# Patient Record
Sex: Female | Born: 1975 | Race: Asian | Hispanic: No | Marital: Married | Smoking: Never smoker
Health system: Southern US, Community
[De-identification: ages and names within clinical notes are randomized; demographics above are authoritative.]

## PROBLEM LIST (undated history)

## (undated) DIAGNOSIS — N852 Hypertrophy of uterus: Secondary | ICD-10-CM

---

## 2016-02-15 ENCOUNTER — Ambulatory Visit (INDEPENDENT_AMBULATORY_CARE_PROVIDER_SITE_OTHER): Payer: Self-pay | Admitting: Internal Medicine

## 2016-02-15 ENCOUNTER — Encounter: Payer: Self-pay | Admitting: Internal Medicine

## 2016-02-15 VITALS — BP 138/82 | HR 84 | Ht <= 58 in | Wt 137.0 lb

## 2016-02-15 DIAGNOSIS — IMO0001 Reserved for inherently not codable concepts without codable children: Secondary | ICD-10-CM

## 2016-02-15 DIAGNOSIS — R030 Elevated blood-pressure reading, without diagnosis of hypertension: Secondary | ICD-10-CM

## 2016-02-15 DIAGNOSIS — L83 Acanthosis nigricans: Secondary | ICD-10-CM

## 2016-02-15 DIAGNOSIS — E669 Obesity, unspecified: Secondary | ICD-10-CM

## 2016-02-15 DIAGNOSIS — N852 Hypertrophy of uterus: Secondary | ICD-10-CM

## 2016-02-15 DIAGNOSIS — K029 Dental caries, unspecified: Secondary | ICD-10-CM

## 2016-02-15 NOTE — Patient Instructions (Signed)
Return to clinic once you have your orange card to get your referral for dentist and ultrasound set up--just drop by after you [facility] the [facility]

## 2016-02-15 NOTE — Progress Notes (Signed)
   Subjective:    Patient ID: Lindsay Howard, female    DOB: 02/26/1976, 40 y.o.   MRN: [id number]  HPI  Essential Hypertension:  Was told [place] that she had high blood pressure--about 8 years ago.  Had Had PIH with her 38 year old child as well. Has had bp checked since and was told her bp was fine. She states she is active cleaning house and cooking.  Does get out and walk around her apartment, but not very far.   Would consider the walking group.  Diet:  Rice, beans, lettuce, pork, beef, chicken, vegetables--lots of vegetables.  Does not drink soda.  Does drink Juice, but not daily.  ONly likes oranges for fruit.  Meds:  None Allergies:  NKDA    Review of Systems  Has regular periods, sometimes heavy, sometimes not.     Objective:   Physical Exam Obese in abdominal area HEENT:  PERRL, EOMI, unable to see discs well, TMs pearly gray, throat without injection.  Diffuse dental decay and discoloration from nut/tobacco chew.  Hyperpigmentation scattered on face (melasma) Neck:  Acanthosis nigricans, No adenopathy or thyromegaly Chest:  CTA CV:  RRR with normal S1 and S2, NO S3, S4, or murmur.  Radial and DP pulses normal and equal. Abd:  Obese, NT, No HSM.  Dome of uterus midway between pubic bone and umbilicus and firm.  NT LE:  NO edema       Assessment & Plan:  1.  Elevated BP:  Okay on recheck Will have her return in 2 weeks for bp check with nurse. CMP today  2.  Obesity with acanthosis nigricans;  A1C  3.  Enlarged uterus:  Pelvic ultrasound after obtaining orange card.  4.  Dental decay:  Dental referral once has orange card.

## 2016-02-16 LAB — COMPREHENSIVE METABOLIC PANEL
A/G RATIO: 1 — AB (ref 1.1–2.5)
ALK PHOS: 83 IU/L (ref 39–117)
ALT: 49 IU/L — AB (ref 0–32)
AST: 42 IU/L — ABNORMAL HIGH (ref 0–40)
Albumin: 4.1 g/dL (ref 3.5–5.5)
BILIRUBIN TOTAL: 0.5 mg/dL (ref 0.0–1.2)
BUN/Creatinine Ratio: 12 (ref 8–20)
BUN: 9 mg/dL (ref 6–20)
CHLORIDE: 102 mmol/L (ref 96–106)
CO2: 23 mmol/L (ref 18–29)
Calcium: 8.8 mg/dL (ref 8.7–10.2)
Creatinine, Ser: 0.75 mg/dL (ref 0.57–1.00)
GFR calc non Af Amer: 101 mL/min/{1.73_m2} (ref >59)
GFR, EST AFRICAN AMERICAN: 117 mL/min/{1.73_m2} (ref >59)
GLUCOSE: 84 mg/dL (ref 65–99)
Globulin, Total: 4.1 g/dL (ref 1.5–4.5)
POTASSIUM: 4.6 mmol/L (ref 3.5–5.2)
Sodium: 140 mmol/L (ref 134–144)
TOTAL PROTEIN: 8.2 g/dL (ref 6.0–8.5)

## 2016-02-16 LAB — HGB A1C W/O EAG: HEMOGLOBIN A1C: 5.7 % — AB (ref 4.8–5.6)

## 2017-02-06 ENCOUNTER — Other Ambulatory Visit: Payer: Self-pay | Admitting: Licensed Clinical Social Worker

## 2019-10-15 ENCOUNTER — Other Ambulatory Visit: Payer: Self-pay

## 2019-10-15 ENCOUNTER — Emergency Department (HOSPITAL_COMMUNITY): Payer: Medicaid Other

## 2019-10-15 ENCOUNTER — Inpatient Hospital Stay (HOSPITAL_COMMUNITY)
Admission: EM | Admit: 2019-10-15 | Discharge: 2019-10-21 | DRG: 065 | Disposition: A | Discharge status: Rehabilitation Facility | Payer: BLUE CROSS/BLUE SHIELD | Attending: Internal Medicine | Admitting: Internal Medicine

## 2019-10-15 DIAGNOSIS — I10 Essential (primary) hypertension: Secondary | ICD-10-CM | POA: Diagnosis present

## 2019-10-15 DIAGNOSIS — I4510 Unspecified right bundle-branch block: Secondary | ICD-10-CM | POA: Diagnosis present

## 2019-10-15 DIAGNOSIS — R402222 Coma scale, best verbal response, incomprehensible words, at arrival to emergency department: Secondary | ICD-10-CM | POA: Diagnosis present

## 2019-10-15 DIAGNOSIS — R1312 Dysphagia, oropharyngeal phase: Secondary | ICD-10-CM | POA: Diagnosis present

## 2019-10-15 DIAGNOSIS — K5900 Constipation, unspecified: Secondary | ICD-10-CM | POA: Diagnosis not present

## 2019-10-15 DIAGNOSIS — Z0189 Encounter for other specified special examinations: Secondary | ICD-10-CM

## 2019-10-15 DIAGNOSIS — I619 Nontraumatic intracerebral hemorrhage, unspecified: Principal | ICD-10-CM | POA: Diagnosis present

## 2019-10-15 DIAGNOSIS — Z20828 Contact with and (suspected) exposure to other viral communicable diseases: Secondary | ICD-10-CM | POA: Diagnosis present

## 2019-10-15 DIAGNOSIS — R471 Dysarthria and anarthria: Secondary | ICD-10-CM | POA: Diagnosis present

## 2019-10-15 DIAGNOSIS — R4701 Aphasia: Secondary | ICD-10-CM | POA: Diagnosis present

## 2019-10-15 DIAGNOSIS — Z4659 Encounter for fitting and adjustment of other gastrointestinal appliance and device: Secondary | ICD-10-CM

## 2019-10-15 DIAGNOSIS — R402132 Coma scale, eyes open, to sound, at arrival to emergency department: Secondary | ICD-10-CM | POA: Diagnosis present

## 2019-10-15 DIAGNOSIS — R402352 Coma scale, best motor response, localizes pain, at arrival to emergency department: Secondary | ICD-10-CM | POA: Diagnosis present

## 2019-10-15 DIAGNOSIS — I610 Nontraumatic intracerebral hemorrhage in hemisphere, subcortical: Secondary | ICD-10-CM | POA: Diagnosis not present

## 2019-10-15 DIAGNOSIS — Z23 Encounter for immunization: Secondary | ICD-10-CM

## 2019-10-15 DIAGNOSIS — F17220 Nicotine dependence, chewing tobacco, uncomplicated: Secondary | ICD-10-CM | POA: Diagnosis present

## 2019-10-15 DIAGNOSIS — Z820 Family history of epilepsy and other diseases of the nervous system: Secondary | ICD-10-CM

## 2019-10-15 DIAGNOSIS — G8191 Hemiplegia, unspecified affecting right dominant side: Secondary | ICD-10-CM | POA: Diagnosis present

## 2019-10-15 DIAGNOSIS — R29810 Facial weakness: Secondary | ICD-10-CM | POA: Diagnosis present

## 2019-10-15 HISTORY — DX: Hypertrophy of uterus: N85.2

## 2019-10-15 LAB — CBC WITH DIFFERENTIAL/PLATELET
Abs Immature Granulocytes: 0.01 10*3/uL (ref 0.00–0.07)
Basophils Absolute: 0 10*3/uL (ref 0.0–0.1)
Basophils Relative: 0 %
Eosinophils Absolute: 0.1 10*3/uL (ref 0.0–0.5)
Eosinophils Relative: 2 %
HCT: 37.6 % (ref 36.0–46.0)
Hemoglobin: 11.9 g/dL — ABNORMAL LOW (ref 12.0–15.0)
Immature Granulocytes: 0 %
Lymphocytes Relative: 36 %
Lymphs Abs: 1.8 10*3/uL (ref 0.7–4.0)
MCH: 25.2 pg — ABNORMAL LOW (ref 26.0–34.0)
MCHC: 31.6 g/dL (ref 30.0–36.0)
MCV: 79.7 fL — ABNORMAL LOW (ref 80.0–100.0)
Monocytes Absolute: 0.6 10*3/uL (ref 0.1–1.0)
Monocytes Relative: 13 %
Neutro Abs: 2.4 10*3/uL (ref 1.7–7.7)
Neutrophils Relative %: 49 %
Platelets: 239 10*3/uL (ref 150–400)
RBC: 4.72 MIL/uL (ref 3.87–5.11)
RDW: 14.8 % (ref 11.5–15.5)
WBC: 4.9 10*3/uL (ref 4.0–10.5)
nRBC: 0 % (ref 0.0–0.2)

## 2019-10-15 LAB — I-STAT CHEM 8, ED
BUN: 10 mg/dL (ref 6–20)
Calcium, Ion: 1.18 mmol/L (ref 1.15–1.40)
Chloride: 105 mmol/L (ref 98–111)
Creatinine, Ser: 0.6 mg/dL (ref 0.44–1.00)
Glucose, Bld: 90 mg/dL (ref 70–99)
HCT: 38 % (ref 36.0–46.0)
Hemoglobin: 12.9 g/dL (ref 12.0–15.0)
Potassium: 3.9 mmol/L (ref 3.5–5.1)
Sodium: 142 mmol/L (ref 135–145)
TCO2: 26 mmol/L (ref 22–32)

## 2019-10-15 LAB — COMPREHENSIVE METABOLIC PANEL
ALT: 110 U/L — ABNORMAL HIGH (ref 0–44)
AST: 113 U/L — ABNORMAL HIGH (ref 15–41)
Albumin: 3.5 g/dL (ref 3.5–5.0)
Alkaline Phosphatase: 94 U/L (ref 38–126)
Anion gap: 10 (ref 5–15)
BUN: 9 mg/dL (ref 6–20)
CO2: 23 mmol/L (ref 22–32)
Calcium: 9 mg/dL (ref 8.9–10.3)
Chloride: 104 mmol/L (ref 98–111)
Creatinine, Ser: 0.73 mg/dL (ref 0.44–1.00)
GFR calc Af Amer: >60 mL/min (ref >60)
GFR calc non Af Amer: >60 mL/min (ref >60)
Glucose, Bld: 96 mg/dL (ref 70–99)
Potassium: 3.8 mmol/L (ref 3.5–5.1)
Sodium: 137 mmol/L (ref 135–145)
Total Bilirubin: 0.7 mg/dL (ref 0.3–1.2)
Total Protein: 8.7 g/dL — ABNORMAL HIGH (ref 6.5–8.1)

## 2019-10-15 LAB — LACTIC ACID, PLASMA: Lactic Acid, Venous: 1.6 mmol/L (ref 0.5–1.9)

## 2019-10-15 LAB — I-STAT BETA HCG BLOOD, ED (MC, WL, AP ONLY): I-stat hCG, quantitative: <5 m[IU]/mL (ref <5)

## 2019-10-15 LAB — PROTIME-INR
INR: 1 (ref 0.8–1.2)
Prothrombin Time: 13.5 seconds (ref 11.4–15.2)

## 2019-10-15 LAB — ETHANOL: Alcohol, Ethyl (B): <10 mg/dL (ref <10)

## 2019-10-15 LAB — CBG MONITORING, ED: Glucose-Capillary: 92 mg/dL (ref 70–99)

## 2019-10-15 LAB — APTT: aPTT: 33 seconds (ref 24–36)

## 2019-10-15 MED ORDER — CLEVIDIPINE BUTYRATE 0.5 MG/ML IV EMUL
0.0000 mg/h | INTRAVENOUS | Status: DC
  Administered 2019-10-16: 3 mg/h via INTRAVENOUS
  Administered 2019-10-17: 1 mg/h via INTRAVENOUS
  Filled 2019-10-15 (×2): qty 50

## 2019-10-15 MED ORDER — IOHEXOL 350 MG/ML SOLN
100.0000 mL | Freq: Once | INTRAVENOUS | Status: AC | PRN
  Administered 2019-10-15: 100 mL via INTRAVENOUS

## 2019-10-15 MED ORDER — LABETALOL HCL 5 MG/ML IV SOLN
20.0000 mg | Freq: Once | INTRAVENOUS | Status: AC
  Administered 2019-10-16: 20 mg via INTRAVENOUS
  Filled 2019-10-15: qty 4

## 2019-10-15 MED ORDER — SENNOSIDES-DOCUSATE SODIUM 8.6-50 MG PO TABS
1.0000 | ORAL_TABLET | Freq: Two times a day (BID) | ORAL | Status: DC
  Administered 2019-10-16 – 2019-10-21 (×10): 1 via ORAL
  Filled 2019-10-15 (×10): qty 1

## 2019-10-15 MED ORDER — ACETAMINOPHEN 160 MG/5ML PO SOLN
650.0000 mg | ORAL | Status: DC | PRN
  Administered 2019-10-17: 650 mg
  Filled 2019-10-15: qty 20.3

## 2019-10-15 MED ORDER — STROKE: EARLY STAGES OF RECOVERY BOOK: Freq: Once | Status: DC

## 2019-10-15 MED ORDER — AMLODIPINE BESYLATE 5 MG PO TABS: 5.0000 mg | ORAL_TABLET | Freq: Every day | ORAL | Status: DC

## 2019-10-15 MED ORDER — ACETAMINOPHEN 325 MG PO TABS: 650.0000 mg | ORAL_TABLET | ORAL | Status: DC | PRN

## 2019-10-15 MED ORDER — ACETAMINOPHEN 650 MG RE SUPP: 650.0000 mg | RECTAL | Status: DC | PRN

## 2019-10-15 MED ORDER — PANTOPRAZOLE SODIUM 40 MG IV SOLR
40.0000 mg | Freq: Every day | INTRAVENOUS | Status: DC
  Administered 2019-10-15 – 2019-10-17 (×3): 40 mg via INTRAVENOUS
  Filled 2019-10-15 (×3): qty 40

## 2019-10-15 NOTE — H&P (Addendum)
Admission H&P    Chief Complaint: Verbally unresponsive with AMS.   HPI: Bufkin Deshay is an 43 y.o. female with a PMHx only of HTN, not on any daily medication. Only prior surgical history is c-section. This evening, she went to lie down after dinner at 5 PM when [facility]. Husband found her moaning incoherently in the bad and not verbally responsive. He then called 911. She chews tobacco but uses no other substances. She is from [place], husband speaks [place]. Son speaks [place] and [place]. She had no complaints before she lay down which is her normal routine after dinner. Currently unable to communicate at all with son or husband in the ED. They state no recent illnesses or unusual symptoms. No sick contacts.   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   No past medical history on file.  No family history on file. Social History:  has no history on file for tobacco, alcohol, and drug.  Allergies: No Known Allergies  ROS: Unable to obtain due to AMS.   Physical Examination: Blood pressure (!) 154/90, pulse 75, temperature 98.4 F (36.9 C), temperature source Rectal, resp. rate 18, SpO2 99 %.  HEENT-  [place]/AT  Lungs - Respirations unlabored. Intermittent sonorous quality.  Extremities - No edema. Warm and well perfused  Neurologic Examination: Mental Status: Awake and nonverbal. Will track examiner's face visually. Appears confused. Mildly agitated. Follows pantomimed commands such as for grimace and tongue protrusion.   Cranial Nerves: II:  No blink to threat on the right. Blinks to threat on the left. Tracks examiner visually.  III,IV, VI: No ptosis. EOMI horizontally. No nystagmus.  V,VII: Right facial droop. Blinks to gentle eyelid stimulation bilaterally.  VIII: Will turn head towards voice IX,X: Unable to visualize palate XI: Head is midline XII: Tongue deviates slightly to right.  Motor: RUE: Drops to bed when raised. Will adduct and flex at elbow  to noxious without overcoming gravity.  RLE: 4/5 withdrawal to noxious and will elevate above bed.  LUE and LLE 5/5 Sensory: Sensation intact to noxious x 4 Deep Tendon Reflexes:  3+ right biceps and brachioradialis 2+ left biceps and brachioradialis 4+ right patella 3+ left patella 1+ achilles bilaterally Plantars: Right: upgoing   Left: downgoing Cerebellar/Gait: No gross ataxia with LUE and LLE movement.  Gait: Unable to assess  Results for orders placed or performed during the hospital encounter of 10/15/19 (from the past 48 hour(s))  CBC with Differential     Status: Abnormal   Collection Time: 10/15/19  7:07 PM  Result Value Ref Range   WBC 4.9 4.0 - 10.5 K/uL   RBC 4.72 3.87 - 5.11 MIL/uL   Hemoglobin 11.9 (L) 12.0 - 15.0 g/dL   HCT 37.6 36.0 - 46.0 %   MCV 79.7 (L) 80.0 - 100.0 fL   MCH 25.2 (L) 26.0 - 34.0 pg   MCHC 31.6 30.0 - 36.0 g/dL   RDW 14.8 11.5 - 15.5 %   Platelets 239 150 - 400 K/uL   nRBC 0.0 0.0 - 0.2 %   Neutrophils Relative % 49 %   Neutro Abs 2.4 1.7 - 7.7 K/uL   Lymphocytes Relative 36 %   Lymphs Abs 1.8 0.7 - 4.0 K/uL   Monocytes Relative 13 %   Monocytes Absolute 0.6 0.1 - 1.0 K/uL   Eosinophils Relative 2 %   Eosinophils Absolute 0.1 0.0 - 0.5 K/uL   Basophils Relative 0 %   Basophils  Absolute 0.0 0.0 - 0.1 K/uL   Immature Granulocytes 0 %   Abs Immature Granulocytes 0.01 0.00 - 0.07 K/uL    Comment: Performed at [facility], [address] [place], [place] [address]  Comprehensive metabolic panel     Status: Abnormal   Collection Time: 10/15/19  7:07 PM  Result Value Ref Range   Sodium 137 135 - 145 mmol/L   Potassium 3.8 3.5 - 5.1 mmol/L   Chloride 104 98 - 111 mmol/L   CO2 23 22 - 32 mmol/L   Glucose, Bld 96 70 - 99 mg/dL   BUN 9 6 - 20 mg/dL   Creatinine, Ser 2.54 0.44 - 1.00 mg/dL   Calcium 9.0 8.9 - 27.0 mg/dL   Total Protein 8.7 (H) 6.5 - 8.1 g/dL   Albumin 3.5 3.5 - 5.0 g/dL   AST 623 (H) 15 - 41 U/L   ALT 110 (H) 0 -  44 U/L   Alkaline Phosphatase 94 38 - 126 U/L   Total Bilirubin 0.7 0.3 - 1.2 mg/dL   GFR calc non Af Amer >60 >60 mL/min   GFR calc Af Amer >60 >60 mL/min   Anion gap 10 5 - 15    Comment: Performed at [facility], [address] [place], [place] [address]  Protime-INR     Status: None   Collection Time: 10/15/19  7:07 PM  Result Value Ref Range   Prothrombin Time 13.5 11.4 - 15.2 seconds   INR 1.0 0.8 - 1.2    Comment: (NOTE) INR goal varies based on device and disease states. Performed at [facility], [address], [place], [place] [address]   APTT     Status: None   Collection Time: 10/15/19  7:07 PM  Result Value Ref Range   aPTT 33 24 - 36 seconds    Comment: Performed at [facility], [address], [place], [place] [address]  CBG monitoring, ED     Status: None   Collection Time: 10/15/19  7:30 PM  Result Value Ref Range   Glucose-Capillary 92 70 - 99 mg/dL  Lactic acid, plasma     Status: None   Collection Time: 10/15/19  7:35 PM  Result Value Ref Range   Lactic Acid, Venous 1.6 0.5 - 1.9 mmol/L    Comment: Performed at [facility], [address], [place], [place] [address]  I-Stat Beta hCG blood, ED ([facility], [facility], [facility] only)     Status: None   Collection Time: 10/15/19  7:37 PM  Result Value Ref Range   I-stat hCG, quantitative <5.0 <5 mIU/mL   Comment 3            Comment:   GEST. AGE      CONC.  (mIU/mL)   <=1 WEEK        5 - 50     2 WEEKS       50 - 500     3 WEEKS       100 - 10,000     4 WEEKS     1,000 - 30,000        FEMALE AND NON-PREGNANT FEMALE:     LESS THAN 5 mIU/mL   I-Stat Chem 8, ED     Status: None   Collection Time: 10/15/19  7:39 PM  Result Value Ref Range   Sodium 142 135 - 145 mmol/L   Potassium 3.9 3.5 - 5.1  mmol/L   Chloride 105 98 - 111 mmol/L   BUN 10 6 - 20 mg/dL   Creatinine, Ser 1.470.60 0.44 - 1.00 mg/dL   Glucose, Bld 90 70 - 99 mg/dL   Calcium, Ion 8.291.18 5.621.15 - 1.40 mmol/L   TCO2 26 22 - 32 mmol/L    Hemoglobin 12.9 12.0 - 15.0 g/dL   HCT 13.038.0 86.536.0 - 78.446.0 %   Dg Chest Portable 1 View  Result Date: 10/15/2019 CLINICAL DATA:  Altered mental status EXAM: PORTABLE CHEST 1 VIEW COMPARISON:  None. FINDINGS: UPPER limits normal heart size noted. There is no evidence of focal airspace disease, pulmonary edema, suspicious pulmonary nodule/mass, pleural effusion, or pneumothorax. No acute bony abnormalities are identified. IMPRESSION: 1. No evidence of active cardiopulmonary disease. 2. UPPER limits normal heart size. Electronically Signed   By: Harmon PierJeffrey  Hu M.D.   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 Exam findings are consistent with the location of the hemorrhage 2. CT head: 2.7 cc acute intraparenchymal hemorrhage centered at the left thalamus. Mild localized edema without significant regional mass effect. No midline shift or intraventricular extension. 3. CTA of head and neck: No large vessel occlusion or hemodynamically significant stenosis. No vascular abnormality seen underlying the left thalamic hemorrhage.  Plan:  1. Admit to ICU under Neurology service 2. MRI of head 3. TTE 4. PT consult, OT consult, Speech consult 5. Cardiac telemetry 6. Frequent neuro checks 7. BP management with clevidipine drip  8. No antiplatelet medications or anticoagulants. DVT prophylaxis with SCDs 9. Starting Norvasc  50 minutes spent in the emergent neurological evaluation and management of this critically ill patient  Electronically signed: Dr. Caryl PinaEric Qamar Aughenbaugh 10/15/2019, 9:14 PM

## 2019-10-15 NOTE — ED Notes (Signed)
Dr. Cheral Marker paged to Why per his request

## 2019-10-15 NOTE — ED Provider Notes (Signed)
[facility] EMERGENCY DEPARTMENT Provider Note   CSN: [id number] Arrival date & time: 10/15/19  1916     History   Chief Complaint Chief Complaint  Patient presents with   Weakness   Altered Mental Status    HPI Lindsay Howard is a 43 y.o. female   Patient arrives via EMS, per EMS patient altered mental status today unknown last seen normal.  They were unable to obtain any meaningful history from family due to language barrier, they report they attempted to use four different family members for translation however each spoke a different dialect.  They report patient slightly hypertensive with systolic 160s otherwise vital signs within normal limits.  On examination they noted her lower abdomen to be tense, otherwise no specific findings other than moaning and altered.  No available supplemental history or information. - On my initial examination is lethargic, moaning, winces from painful stimuli bilaterally.  Patient with flaccid right upper extremity, questionable right-sided facial droop.  When holding her right arm up she will allow it to fall completely, she is able to hold the left upper extremity up and resists movement well.  She fights examination to the left eye, right face soft and does not fight exam. - There is concern for stroke at this time, order set utilized however we have no last seen normal or supplemental history on this patient, neurology Dr. Otelia Limes was consulted by Dr. Madilyn Hook advises not to activate code stroke as we do not have last seen normal but we will continue to get CT angio head and neck and labs.  I called CT scanner to inform them of patient and they are awaiting i-STAT Chem-8. I informed nurse first and triage staff to call me when patient's family arrives, no listed phone number and I am unable to contact patient's family at this time.    HPI  No past medical history on file.  Patient Active Problem List   Diagnosis Date Noted    Intracerebral hemorrhage 10/15/2019   ICH (intracerebral hemorrhage) (HCC) 10/15/2019       OB History   No obstetric history on file.      Home Medications    Prior to Admission medications   Not on File    Family History No family history on file.  Social History Social History   Tobacco Use   Smoking status: Not on file  Substance Use Topics   Alcohol use: Not on file   Drug use: Not on file     Allergies   Patient has no known allergies.   Review of Systems Review of Systems  Unable to perform ROS: Mental status change   Physical Exam Updated Vital Signs BP 138/71 (BP Location: Right Arm)    Pulse 75    Temp 98.4 F (36.9 C) (Rectal)    Resp 18    SpO2 98%   Physical Exam Constitutional:      Appearance: She is well-developed and overweight. She is not ill-appearing or toxic-appearing.  HENT:     Head: Normocephalic and atraumatic. No raccoon eyes or Battle's sign.     Jaw: There is normal jaw occlusion. No trismus.     Comments: Questionable right facial droop    Right Ear: External ear normal.     Left Ear: External ear normal.     Nose:     Right Nostril: No epistaxis.     Left Nostril: No epistaxis.     Mouth/Throat:  Mouth: Mucous membranes are moist.     Pharynx: Oropharynx is clear.  Eyes:     Conjunctiva/sclera: Conjunctivae normal.     Pupils: Pupils are equal, round, and reactive to light.     Comments: Holds left shut and resists exam; right eye pupil reactive, round  Neck:     Musculoskeletal: Neck supple. No edema.     Trachea: Trachea normal. No tracheal tenderness or tracheal deviation.  Cardiovascular:     Rate and Rhythm: Normal rate and regular rhythm.     Pulses:          Radial pulses are 2+ on the right side and 2+ on the left side.       Dorsalis pedis pulses are 2+ on the right side and 2+ on the left side.     Heart sounds: Normal heart sounds.  Pulmonary:     Effort: Pulmonary effort is normal. No accessory  muscle usage or respiratory distress.     Breath sounds: Normal breath sounds and air entry.  Abdominal:     Palpations: There is no pulsatile mass.     Tenderness: There is no abdominal tenderness. There is no guarding or rebound.     Comments: Abdomen soft, mass of lower abdomen suspicious for fibroid.  Skin:    General: Skin is warm and dry.  Neurological:     Mental Status: She is lethargic.     GCS: GCS eye subscore is 3. GCS verbal subscore is 2. GCS motor subscore is 5.     Comments: Patient lethargic, moaning, flaccid right upper extremity, resists motion with left upper extremity, right face soft, questionable facial droop.  Resists eye opening to left side.  Does not follow commands, localizes to pain, arouses to pain bilaterally.    ED Treatments / Results  Labs (all labs ordered are listed, but only abnormal results are displayed) Labs Reviewed  CBC WITH DIFFERENTIAL/PLATELET - Abnormal; Notable for the following components:      Result Value   Hemoglobin 11.9 (*)    MCV 79.7 (*)    MCH 25.2 (*)    All other components within normal limits  COMPREHENSIVE METABOLIC PANEL - Abnormal; Notable for the following components:   Total Protein 8.7 (*)    AST 113 (*)    ALT 110 (*)    All other components within normal limits  SARS CORONAVIRUS 2 (TAT 6-24 HRS)  PROTIME-INR  LACTIC ACID, PLASMA  ETHANOL  APTT  URINALYSIS, ROUTINE W REFLEX MICROSCOPIC  AMMONIA  RAPID URINE DRUG SCREEN, HOSP PERFORMED  HIV ANTIBODY (ROUTINE TESTING W REFLEX)  HEMOGLOBIN A1C  PREGNANCY, URINE  CBG MONITORING, ED  I-STAT CHEM 8, ED  I-STAT BETA HCG BLOOD, ED ([facility], [facility], [facility] ONLY)  TROPONIN I (HIGH SENSITIVITY)    EKG EKG Interpretation  Date/Time:  Thursday October 15 2019 19:23:24 EST Ventricular Rate:  73 PR Interval:  168 QRS Duration: 96 QT Interval:  434 QTC Calculation: 478 R Axis:   -28 Text Interpretation: Normal sinus rhythm Incomplete right bundle branch block Nonspecific T  wave abnormality Prolonged QT Abnormal ECG no prior available for comparison Confirmed by Tilden Fossaees, Elizabeth [phone]([id number]) on 10/15/2019 7:29:09 PM   Radiology Ct Angio Head W Or Wo Contrast  Result Date: 10/15/2019 CLINICAL DATA:  Whether you guys doing EXAM: CT ANGIOGRAPHY HEAD AND NECK TECHNIQUE: Multidetector CT imaging of the head and neck was performed using the standard protocol during bolus administration of intravenous contrast. Multiplanar CT image  reconstructions and MIPs were obtained to evaluate the vascular anatomy. Carotid stenosis measurements (when applicable) are obtained utilizing NASCET criteria, using the distal internal carotid diameter as the denominator. CONTRAST:  OMNIPAQUE IOHEXOL 350 MG/ML SOLN COMPARISON:  None. FINDINGS: CT HEAD FINDINGS Brain: Acute intraparenchymal hemorrhage centered at the left thalamus measures 2.4 x 1.7 x 1.3 cm (estimated volume 2.7 cc). Surrounding low-density vasogenic edema without significant regional mass effect. No significant midline shift. No intraventricular extension. Remainder the brain is normal in appearance. No other acute intracranial hemorrhage. No acute large vessel territory infarct. No mass lesion. No extra-axial fluid collection. Vascular: No hyperdense vessel. Skull: Scalp soft tissues and calvarium within normal limits. Sinuses: Mild-to-moderate mucosal thickening within the ethmoidal air cells, sphenoid sinuses, and maxillary sinuses. Mastoid air cells are clear. Orbits: Globes and orbital soft tissues demonstrate no acute finding. Review of the MIP images confirms the above findings CTA NECK FINDINGS Aortic arch: Visualized aortic arch of normal caliber with normal 3 vessel morphology. No hemodynamically significant stenosis about the origin of the great vessels. Visualized subclavian arteries widely patent. Right carotid system: Right common and internal carotid arteries are widely patent without stenosis, dissection, or occlusion. Right  ICA medialized into the retropharyngeal space. Left carotid system: Left common and internal carotid arteries widely patent without stenosis, dissection or occlusion. Left ICA medialized into the retropharyngeal space. Vertebral arteries: Both vertebral arteries arise from the subclavian arteries. Vertebral arteries widely patent within the neck without stenosis, dissection or occlusion. Skeleton: No acute osseous abnormality. No discrete lytic or blastic osseous lesions. Other neck: No other acute soft tissue abnormality within the neck. Upper chest: Visualized upper chest demonstrates no acute finding. Review of the MIP images confirms the above findings CTA HEAD FINDINGS Anterior circulation: Internal carotid arteries widely patent to the termini without stenosis or other abnormality. A1 segments, anterior communicating artery common anterior cerebral arteries widely patent. Both ACAs well perfused and widely patent to their distal aspects. No M1 stenosis or occlusion. Normal MCA bifurcations. Distal MCA branches well perfused and symmetric. Posterior circulation: Both vertebral arteries patent to the vertebrobasilar junction without stenosis. Right vertebral artery dominant. Posterior inferior cerebral arteries not well seen. Basilar widely patent to its distal aspect without stenosis. Superior cerebral arteries patent bilaterally. Both PCAs well perfused and widely patent to their distal aspects. Venous sinuses: Patent. Anatomic variants: None significant. No vascular abnormality seen underlying the left thalamic hemorrhage. Review of the MIP images confirms the above findings IMPRESSION: CT HEAD IMPRESSION: 2.7 cc acute intraparenchymal hemorrhage centered at the left thalamus. Mild localized edema without significant regional mass effect. No midline shift or intraventricular extension. CTA HEAD AND NECK IMPRESSION: 1. Normal CTA of the head and neck. No large vessel occlusion or hemodynamically significant  stenosis. 2. No vascular abnormality seen underlying the left thalamic hemorrhage. Critical Value/emergent results were called by telephone at the time of interpretation on 10/15/2019 at 9:10 pm to [facility] , who verbally acknowledged these results. Electronically Signed   By: Rise Mu M.D.   On: 10/15/2019 21:34   Ct Angio Neck W And/or Wo Contrast  Result Date: 10/15/2019 CLINICAL DATA:  Whether you guys doing EXAM: CT ANGIOGRAPHY HEAD AND NECK TECHNIQUE: Multidetector CT imaging of the head and neck was performed using the standard protocol during bolus administration of intravenous contrast. Multiplanar CT image reconstructions and MIPs were obtained to evaluate the vascular anatomy. Carotid stenosis measurements (when applicable) are obtained utilizing NASCET criteria, using the distal  internal carotid diameter as the denominator. CONTRAST:  OMNIPAQUE IOHEXOL 350 MG/ML SOLN COMPARISON:  None. FINDINGS: CT HEAD FINDINGS Brain: Acute intraparenchymal hemorrhage centered at the left thalamus measures 2.4 x 1.7 x 1.3 cm (estimated volume 2.7 cc). Surrounding low-density vasogenic edema without significant regional mass effect. No significant midline shift. No intraventricular extension. Remainder the brain is normal in appearance. No other acute intracranial hemorrhage. No acute large vessel territory infarct. No mass lesion. No extra-axial fluid collection. Vascular: No hyperdense vessel. Skull: Scalp soft tissues and calvarium within normal limits. Sinuses: Mild-to-moderate mucosal thickening within the ethmoidal air cells, sphenoid sinuses, and maxillary sinuses. Mastoid air cells are clear. Orbits: Globes and orbital soft tissues demonstrate no acute finding. Review of the MIP images confirms the above findings CTA NECK FINDINGS Aortic arch: Visualized aortic arch of normal caliber with normal 3 vessel morphology. No hemodynamically significant stenosis about the origin of the  great vessels. Visualized subclavian arteries widely patent. Right carotid system: Right common and internal carotid arteries are widely patent without stenosis, dissection, or occlusion. Right ICA medialized into the retropharyngeal space. Left carotid system: Left common and internal carotid arteries widely patent without stenosis, dissection or occlusion. Left ICA medialized into the retropharyngeal space. Vertebral arteries: Both vertebral arteries arise from the subclavian arteries. Vertebral arteries widely patent within the neck without stenosis, dissection or occlusion. Skeleton: No acute osseous abnormality. No discrete lytic or blastic osseous lesions. Other neck: No other acute soft tissue abnormality within the neck. Upper chest: Visualized upper chest demonstrates no acute finding. Review of the MIP images confirms the above findings CTA HEAD FINDINGS Anterior circulation: Internal carotid arteries widely patent to the termini without stenosis or other abnormality. A1 segments, anterior communicating artery common anterior cerebral arteries widely patent. Both ACAs well perfused and widely patent to their distal aspects. No M1 stenosis or occlusion. Normal MCA bifurcations. Distal MCA branches well perfused and symmetric. Posterior circulation: Both vertebral arteries patent to the vertebrobasilar junction without stenosis. Right vertebral artery dominant. Posterior inferior cerebral arteries not well seen. Basilar widely patent to its distal aspect without stenosis. Superior cerebral arteries patent bilaterally. Both PCAs well perfused and widely patent to their distal aspects. Venous sinuses: Patent. Anatomic variants: None significant. No vascular abnormality seen underlying the left thalamic hemorrhage. Review of the MIP images confirms the above findings IMPRESSION: CT HEAD IMPRESSION: 2.7 cc acute intraparenchymal hemorrhage centered at the left thalamus. Mild localized edema without significant  regional mass effect. No midline shift or intraventricular extension. CTA HEAD AND NECK IMPRESSION: 1. Normal CTA of the head and neck. No large vessel occlusion or hemodynamically significant stenosis. 2. No vascular abnormality seen underlying the left thalamic hemorrhage. Critical Value/emergent results were called by telephone at the time of interpretation on 10/15/2019 at 9:10 pm to [facility] , who verbally acknowledged these results. Electronically Signed   By: Rise Mu M.D.   On: 10/15/2019 21:34   Dg Chest Portable 1 View  Result Date: 10/15/2019 CLINICAL DATA:  Altered mental status EXAM: PORTABLE CHEST 1 VIEW COMPARISON:  None. FINDINGS: UPPER limits normal heart size noted. There is no evidence of focal airspace disease, pulmonary edema, suspicious pulmonary nodule/mass, pleural effusion, or pneumothorax. No acute bony abnormalities are identified. IMPRESSION: 1. No evidence of active cardiopulmonary disease. 2. UPPER limits normal heart size. Electronically Signed   By: Harmon Pier M.D.   On: 10/15/2019 19:56    Procedures .Critical Care Performed by: Bill Salinas, PA-C Authorized  by: Deliah Boston, PA-C   Critical care provider statement:    Critical care time (minutes):  31   Critical care was necessary to treat or prevent imminent or life-threatening deterioration of the following conditions:  CNS failure or compromise   Critical care was time spent personally by me on the following activities:  Discussions with consultants, evaluation of patient's response to treatment, examination of patient, ordering and performing treatments and interventions, ordering and review of laboratory studies, ordering and review of radiographic studies, pulse oximetry, re-evaluation of patient's condition, obtaining history from patient or surrogate, review of old charts and development of treatment plan with patient or surrogate   (including critical care  time)  Medications Ordered in ED Medications   stroke: mapping our early stages of recovery book (has no administration in time range)  acetaminophen (TYLENOL) tablet 650 mg (has no administration in time range)    Or  acetaminophen (TYLENOL) 160 MG/5ML solution 650 mg (has no administration in time range)    Or  acetaminophen (TYLENOL) suppository 650 mg (has no administration in time range)  senna-docusate (Senokot-S) tablet 1 tablet (has no administration in time range)  pantoprazole (PROTONIX) injection 40 mg (has no administration in time range)  labetalol (NORMODYNE) injection 20 mg (has no administration in time range)    And  clevidipine (CLEVIPREX) infusion 0.5 mg/mL (has no administration in time range)  amLODipine (NORVASC) tablet 5 mg (has no administration in time range)  iohexol (OMNIPAQUE) 350 MG/ML injection 100 mL (100 mLs Intravenous Contrast Given 10/15/19 2022)     Initial Impression / Assessment and Plan / ED Course  I have reviewed the triage vital signs and the nursing notes.  Pertinent labs & imaging results that were available during my care of the patient were reviewed by me and considered in my medical decision making (see chart for details).     Beta-hCG negative Lactic 1.6 CBG 92 I-STAT Chem-8 within normal limit APTT within normal limits PT/INR within normal limit  CBC nonacute CMP with mildly elevated AST and ALT CXR:  IMPRESSION:  1. No evidence of active cardiopulmonary disease.  2. UPPER limits normal heart size.  - 8:14 PM: Accompanied patient to CT scanner, it appears she has hemorrhage on CT head, awaiting official radiology report.  Consult placed to neurosurgery team for their input. - 8:40 PM: Patient's husband and 44 year old son are now at bedside.  Son does speak English and translated his father however he was hesitant on some questions, [employer] video interpreter was used to translate directly with husband, [place]ese, per both patient  son and husband patient has been in normal state of health until after dinner today.  They ate dinner around 5 PM, patient was seen going to bed at 7 PM.  Husband then went to the bedroom and noticed patient was moaning, he was unable to wake her up so they called 911.  Per patient's husband she has a history of hypertension and distant cesarean section otherwise healthy, no daily medication use.  Husband reports that patient uses chewing tobacco daily denies any alcohol use or any drug use.  No recent illness or sick contacts.  Husband states understanding of need for admission for further work-up,. - Informed that case rediscussed between Dr. Ralene Bathe and neurologist Dr. Cheral Marker.  Dr. Cheral Marker to admit patient to his service.  CT Head/Angio Head and Neck:  IMPRESSION:  CT HEAD IMPRESSION:    2.7 cc acute intraparenchymal hemorrhage centered at the left  thalamus. Mild localized edema without significant regional mass  effect. No midline shift or intraventricular extension.    CTA HEAD AND NECK IMPRESSION:    1. Normal CTA of the head and neck. No large vessel occlusion or  hemodynamically significant stenosis.  2. No vascular abnormality seen underlying the left thalamic  hemorrhage.   EKG :Normal sinus rhythm Incomplete right bundle branch block Nonspecific T wave abnormality Prolonged QT Abnormal ECG no prior available for comparison Confirmed by Tilden Fossa [phone]) on 10/15/2019 7:29:09 PM - Patient reassessed no change, resting comfortably no acute distress.  Patient has been admitted for further evaluation management.   Patient was seen and evaluated by Dr. Madilyn Hook during this visit.  Note: Portions of this report may have been transcribed using voice recognition software. Every effort was made to ensure accuracy; however, inadvertent computerized transcription errors may still be present. Final Clinical Impressions(s) / ED Diagnoses   Final diagnoses:  Left-sided nontraumatic  intracerebral hemorrhage, unspecified cerebral location [facility])    ED Discharge Orders    None       Elizabeth Palau 10/15/19 2252    Tilden Fossa, MD 10/18/19 1104

## 2019-10-15 NOTE — ED Notes (Signed)
Neurosurgery paged to [place] per his request

## 2019-10-16 ENCOUNTER — Inpatient Hospital Stay (HOSPITAL_COMMUNITY): Payer: Medicaid Other

## 2019-10-16 ENCOUNTER — Encounter (HOSPITAL_COMMUNITY): Payer: Self-pay

## 2019-10-16 ENCOUNTER — Inpatient Hospital Stay (HOSPITAL_COMMUNITY): Payer: BLUE CROSS/BLUE SHIELD

## 2019-10-16 DIAGNOSIS — I610 Nontraumatic intracerebral hemorrhage in hemisphere, subcortical: Secondary | ICD-10-CM | POA: Diagnosis not present

## 2019-10-16 DIAGNOSIS — I6389 Other cerebral infarction: Secondary | ICD-10-CM

## 2019-10-16 LAB — RAPID URINE DRUG SCREEN, HOSP PERFORMED
Amphetamines: NOT DETECTED
Barbiturates: NOT DETECTED
Benzodiazepines: NOT DETECTED
Cocaine: NOT DETECTED
Opiates: NOT DETECTED
Tetrahydrocannabinol: NOT DETECTED

## 2019-10-16 LAB — MRSA PCR SCREENING: MRSA by PCR: NEGATIVE

## 2019-10-16 LAB — HEMOGLOBIN A1C
Hgb A1c MFr Bld: 5.7 % — ABNORMAL HIGH (ref 4.8–5.6)
Mean Plasma Glucose: 116.89 mg/dL

## 2019-10-16 LAB — URINALYSIS, ROUTINE W REFLEX MICROSCOPIC
Bilirubin Urine: NEGATIVE
Glucose, UA: NEGATIVE mg/dL
Hgb urine dipstick: NEGATIVE
Ketones, ur: NEGATIVE mg/dL
Leukocytes,Ua: NEGATIVE
Nitrite: NEGATIVE
Protein, ur: NEGATIVE mg/dL
Specific Gravity, Urine: 1.034 — ABNORMAL HIGH (ref 1.005–1.030)
pH: 6 (ref 5.0–8.0)

## 2019-10-16 LAB — SARS CORONAVIRUS 2 (TAT 6-24 HRS): SARS Coronavirus 2: NEGATIVE

## 2019-10-16 LAB — ECHOCARDIOGRAM COMPLETE

## 2019-10-16 LAB — HIV ANTIBODY (ROUTINE TESTING W REFLEX): HIV Screen 4th Generation wRfx: NONREACTIVE

## 2019-10-16 LAB — AMMONIA: Ammonia: 28 umol/L (ref 9–35)

## 2019-10-16 LAB — TROPONIN I (HIGH SENSITIVITY): Troponin I (High Sensitivity): 5 ng/L (ref <18)

## 2019-10-16 LAB — PREGNANCY, URINE: Preg Test, Ur: NEGATIVE

## 2019-10-16 MED ORDER — INFLUENZA VAC SPLIT QUAD 0.5 ML IM SUSY
0.5000 mL | PREFILLED_SYRINGE | INTRAMUSCULAR | Status: AC
  Administered 2019-10-17: 0.5 mL via INTRAMUSCULAR
  Filled 2019-10-16: qty 0.5

## 2019-10-16 MED ORDER — LIDOCAINE VISCOUS HCL 2 % MT SOLN
15.0000 mL | Freq: Once | OROMUCOSAL | Status: AC
  Administered 2019-10-16: 15 mL via OROMUCOSAL
  Filled 2019-10-16: qty 15

## 2019-10-16 MED ORDER — SODIUM CHLORIDE 0.9 % IV SOLN
INTRAVENOUS | Status: DC
  Administered 2019-10-16 – 2019-10-21 (×5): via INTRAVENOUS

## 2019-10-16 MED ORDER — CHLORHEXIDINE GLUCONATE CLOTH 2 % EX PADS
6.0000 | MEDICATED_PAD | Freq: Every day | CUTANEOUS | Status: DC
  Administered 2019-10-16 – 2019-10-21 (×6): 6 via TOPICAL

## 2019-10-16 MED ORDER — AMLODIPINE BESYLATE 10 MG PO TABS
10.0000 mg | ORAL_TABLET | Freq: Every day | ORAL | Status: DC
  Administered 2019-10-17 – 2019-10-21 (×4): 10 mg via ORAL
  Filled 2019-10-16 (×5): qty 1

## 2019-10-16 MED ORDER — ORAL CARE MOUTH RINSE
15.0000 mL | Freq: Two times a day (BID) | OROMUCOSAL | Status: DC
  Administered 2019-10-16 – 2019-10-21 (×9): 15 mL via OROMUCOSAL

## 2019-10-16 NOTE — ED Notes (Signed)
Patient transported to MRI 

## 2019-10-16 NOTE — Progress Notes (Signed)
Initial Nutrition Assessment  DOCUMENTATION CODES:   Not applicable  INTERVENTION:   Monitor for diet advancement; supplement as appropriate.     NUTRITION DIAGNOSIS:   Inadequate oral intake related to inability to eat as evidenced by NPO status.  GOAL:   Patient will meet greater than or equal to 90% of their needs  MONITOR:   Diet advancement, I & O's  REASON FOR ASSESSMENT:   Malnutrition Screening Tool    ASSESSMENT:   Pt with PMH of HTN on no medications and chews tobacco admitted with L thalamic acute ICH with extension in to the L midbrain.   Pt just admitted, 23 F NG to LIS Therapies consulted   Medications reviewed and include: senokot Cleviprex @ 2 ml/hr Labs reviewed    NUTRITION - FOCUSED PHYSICAL EXAM:    Most Recent Value  Orbital Region  No depletion  Upper Arm Region  No depletion  Thoracic and Lumbar Region  No depletion  Buccal Region  No depletion  Temple Region  No depletion  Clavicle Bone Region  No depletion  Clavicle and Acromion Bone Region  No depletion  Scapular Bone Region  No depletion  Dorsal Hand  No depletion  Patellar Region  No depletion  Anterior Thigh Region  No depletion  Posterior Calf Region  No depletion  Edema (RD Assessment)  None  Hair  Reviewed  Eyes  Unable to assess  Mouth  Unable to assess  Skin  Reviewed  Nails  Reviewed       Diet Order:   Diet Order            Diet NPO time specified  Diet effective now              EDUCATION NEEDS:   No education needs have been identified at this time  Skin:  Skin Assessment: Reviewed RN Assessment  Last BM:  unknown  Height:   Ht Readings from Last 1 Encounters:  10/16/19 4\' 11"  (1.499 m)    Weight:   Wt Readings from Last 1 Encounters:  10/16/19 63.6 kg    Ideal Body Weight:  44.6 kg  BMI:  Body mass index is 28.32 kg/m.  Estimated Nutritional Needs:   Kcal:  1600-1800  Protein:  75-90 grams  Fluid:  > 1.6 L/day  Maylon Peppers RD, LDN, CNSC [phone] Pager [phone] After Hours Pager

## 2019-10-16 NOTE — ED Notes (Signed)
Went to check on pt family in room NG was out , replaced ,

## 2019-10-16 NOTE — Progress Notes (Signed)
  Echocardiogram 2D Echocardiogram has been performed.  Rylin, Saez M 10/16/2019, 9:03 AM

## 2019-10-16 NOTE — ED Notes (Signed)
Labetalol 20mg  IV given with a BP of 145/95.  Will recheck BP at 0200 to monitor for need of a drip.

## 2019-10-16 NOTE — ED Notes (Signed)
Per xray they could not find NG , it had pulled out again  Replaced  And retaped and note sent to dr  Pt re xrayed

## 2019-10-16 NOTE — Progress Notes (Signed)
STROKE TEAM PROGRESS NOTE   INTERVAL HISTORY Pt son at bedside. Pt not english speaking, son as interpretor. However, pt only able to tell me her name and son's name, all other answer's not understandable by son. Not quite following commands. 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 Has large NG tube placed.   Vitals:   10/16/19 0630 10/16/19 0645 10/16/19 0900 10/16/19 0915  BP: 124/73 113/71 116/78 102/62  Pulse:  67 66 80  Resp: Temp:      TempSrc:      SpO2:  99% 98% 99%    CBC:  Recent Labs  Lab 10/15/19 1907 10/15/19 1939  WBC 4.9  --   NEUTROABS 2.4  --   HGB 11.9* 12.9  HCT 37.6 38.0  MCV 79.7*  --   PLT 239  --     Basic Metabolic Panel:  Recent Labs  Lab 10/15/19 1907 10/15/19 1939  NA 137 142  K 3.8 3.9  CL 104 105  CO2 23  --   GLUCOSE 96 90  BUN 9 10  CREATININE 0.73 0.60  CALCIUM 9.0  --    Lipid Panel: No results found for: CHOL, TRIG, HDL, CHOLHDL, VLDL, LDLCALC HgbA1c:  Lab Results  Component Value Date   HGBA1C 5.7 (H) 10/16/2019   Urine Drug Screen:     Component Value Date/Time   LABOPIA NONE DETECTED 10/16/2019 0608   COCAINSCRNUR NONE DETECTED 10/16/2019 0608   LABBENZ NONE DETECTED 10/16/2019 0608   AMPHETMU NONE DETECTED 10/16/2019 0608   THCU NONE DETECTED 10/16/2019 0608   LABBARB NONE DETECTED 10/16/2019 8841    Alcohol Level     Component Value Date/Time   ETH <10 10/15/2019 2122    IMAGING Ct Angio Head W Or Wo Contrast  Result Date: 10/15/2019 CLINICAL DATA:  Whether you guys doing EXAM: CT ANGIOGRAPHY HEAD AND NECK TECHNIQUE: Multidetector CT imaging of the head and neck was performed using the standard protocol during bolus administration of intravenous contrast. Multiplanar CT image reconstructions and MIPs were obtained to evaluate the vascular anatomy. Carotid stenosis measurements (when applicable) are obtained utilizing NASCET criteria, using the distal internal carotid diameter as the denominator. CONTRAST:   OMNIPAQUE IOHEXOL 350 MG/ML SOLN COMPARISON:  None. FINDINGS: CT HEAD FINDINGS Brain: Acute intraparenchymal hemorrhage centered at the left thalamus measures 2.4 x 1.7 x 1.3 cm (estimated volume 2.7 cc). Surrounding low-density vasogenic edema without significant regional mass effect. No significant midline shift. No intraventricular extension. Remainder the brain is normal in appearance. No other acute intracranial hemorrhage. No acute large vessel territory infarct. No mass lesion. No extra-axial fluid collection. Vascular: No hyperdense vessel. Skull: Scalp soft tissues and calvarium within normal limits. Sinuses: Mild-to-moderate mucosal thickening within the ethmoidal air cells, sphenoid sinuses, and maxillary sinuses. Mastoid air cells are clear. Orbits: Globes and orbital soft tissues demonstrate no acute finding. Review of the MIP images confirms the above findings CTA NECK FINDINGS Aortic arch: Visualized aortic arch of normal caliber with normal 3 vessel morphology. No hemodynamically significant stenosis about the origin of the great vessels. Visualized subclavian arteries widely patent. Right carotid system: Right common and internal carotid arteries are widely patent without stenosis, dissection, or occlusion. Right ICA medialized into the retropharyngeal space. Left carotid system: Left common and internal carotid arteries widely patent without stenosis, dissection or occlusion. Left ICA medialized into the retropharyngeal space. Vertebral arteries: Both vertebral arteries arise from the subclavian arteries. Vertebral arteries widely patent within the  neck without stenosis, dissection or occlusion. Skeleton: No acute osseous abnormality. No discrete lytic or blastic osseous lesions. Other neck: No other acute soft tissue abnormality within the neck. Upper chest: Visualized upper chest demonstrates no acute finding. Review of the MIP images confirms the above findings CTA HEAD FINDINGS Anterior  circulation: Internal carotid arteries widely patent to the termini without stenosis or other abnormality. A1 segments, anterior communicating artery common anterior cerebral arteries widely patent. Both ACAs well perfused and widely patent to their distal aspects. No M1 stenosis or occlusion. Normal MCA bifurcations. Distal MCA branches well perfused and symmetric. Posterior circulation: Both vertebral arteries patent to the vertebrobasilar junction without stenosis. Right vertebral artery dominant. Posterior inferior cerebral arteries not well seen. Basilar widely patent to its distal aspect without stenosis. Superior cerebral arteries patent bilaterally. Both PCAs well perfused and widely patent to their distal aspects. Venous sinuses: Patent. Anatomic variants: None significant. No vascular abnormality seen underlying the left thalamic hemorrhage. Review of the MIP images confirms the above findings IMPRESSION: CT HEAD IMPRESSION: 2.7 cc acute intraparenchymal hemorrhage centered at the left thalamus. Mild localized edema without significant regional mass effect. No midline shift or intraventricular extension. CTA HEAD AND NECK IMPRESSION: 1. Normal CTA of the head and neck. No large vessel occlusion or hemodynamically significant stenosis. 2. No vascular abnormality seen underlying the left thalamic hemorrhage. Critical Value/emergent results were called by telephone at the time of interpretation on 10/15/2019 at 9:10 pm to [facility] , who verbally acknowledged these results. Electronically Signed   By: Jeannine Boga M.D.   On: 10/15/2019 21:34   Ct Angio Neck W And/or Wo Contrast  Result Date: 10/15/2019 CLINICAL DATA:  Whether you guys doing EXAM: CT ANGIOGRAPHY HEAD AND NECK TECHNIQUE: Multidetector CT imaging of the head and neck was performed using the standard protocol during bolus administration of intravenous contrast. Multiplanar CT image reconstructions and MIPs were obtained to  evaluate the vascular anatomy. Carotid stenosis measurements (when applicable) are obtained utilizing NASCET criteria, using the distal internal carotid diameter as the denominator. CONTRAST:  154mL OMNIPAQUE IOHEXOL 350 MG/ML SOLN COMPARISON:  None. FINDINGS: CT HEAD FINDINGS Brain: Acute intraparenchymal hemorrhage centered at the left thalamus measures 2.4 x 1.7 x 1.3 cm (estimated volume 2.7 cc). Surrounding low-density vasogenic edema without significant regional mass effect. No significant midline shift. No intraventricular extension. Remainder the brain is normal in appearance. No other acute intracranial hemorrhage. No acute large vessel territory infarct. No mass lesion. No extra-axial fluid collection. Vascular: No hyperdense vessel. Skull: Scalp soft tissues and calvarium within normal limits. Sinuses: Mild-to-moderate mucosal thickening within the ethmoidal air cells, sphenoid sinuses, and maxillary sinuses. Mastoid air cells are clear. Orbits: Globes and orbital soft tissues demonstrate no acute finding. Review of the MIP images confirms the above findings CTA NECK FINDINGS Aortic arch: Visualized aortic arch of normal caliber with normal 3 vessel morphology. No hemodynamically significant stenosis about the origin of the great vessels. Visualized subclavian arteries widely patent. Right carotid system: Right common and internal carotid arteries are widely patent without stenosis, dissection, or occlusion. Right ICA medialized into the retropharyngeal space. Left carotid system: Left common and internal carotid arteries widely patent without stenosis, dissection or occlusion. Left ICA medialized into the retropharyngeal space. Vertebral arteries: Both vertebral arteries arise from the subclavian arteries. Vertebral arteries widely patent within the neck without stenosis, dissection or occlusion. Skeleton: No acute osseous abnormality. No discrete lytic or blastic osseous lesions. Other neck: No other  acute soft tissue abnormality within the neck. Upper chest: Visualized upper chest demonstrates no acute finding. Review of the MIP images confirms the above findings CTA HEAD FINDINGS Anterior circulation: Internal carotid arteries widely patent to the termini without stenosis or other abnormality. A1 segments, anterior communicating artery common anterior cerebral arteries widely patent. Both ACAs well perfused and widely patent to their distal aspects. No M1 stenosis or occlusion. Normal MCA bifurcations. Distal MCA branches well perfused and symmetric. Posterior circulation: Both vertebral arteries patent to the vertebrobasilar junction without stenosis. Right vertebral artery dominant. Posterior inferior cerebral arteries not well seen. Basilar widely patent to its distal aspect without stenosis. Superior cerebral arteries patent bilaterally. Both PCAs well perfused and widely patent to their distal aspects. Venous sinuses: Patent. Anatomic variants: None significant. No vascular abnormality seen underlying the left thalamic hemorrhage. Review of the MIP images confirms the above findings IMPRESSION: CT HEAD IMPRESSION: 2.7 cc acute intraparenchymal hemorrhage centered at the left thalamus. Mild localized edema without significant regional mass effect. No midline shift or intraventricular extension. CTA HEAD AND NECK IMPRESSION: 1. Normal CTA of the head and neck. No large vessel occlusion or hemodynamically significant stenosis. 2. No vascular abnormality seen underlying the left thalamic hemorrhage. Critical Value/emergent results were called by telephone at the time of interpretation on 10/15/2019 at 9:10 pm to [facility] , who verbally acknowledged these results. Electronically Signed   By: Rise MuBenjamin  McClintock M.D.   On: 10/15/2019 21:34   Mr Brain Wo Contrast  Addendum Date: 10/16/2019   ADDENDUM REPORT: 10/16/2019 06:34 ADDENDUM: Omitted impression #4 of generally low marrow signal, please  correlate with CBC. Electronically Signed   By: Marnee SpringJonathon  Watts M.D.   On: 10/16/2019 06:34   Result Date: 10/16/2019 CLINICAL DATA:  Intracranial hemorrhage EXAM: MRI HEAD WITHOUT CONTRAST TECHNIQUE: Multiplanar, multiecho pulse sequences of the brain and surrounding structures were obtained without intravenous contrast. COMPARISON:  CTA head neck from yesterday FINDINGS: Brain: When measured on T1 and T2 weighted imaging there is a size stable left thalamic hematoma extending towards the upper midbrain which measures 14 x 22 mm on axial slices. Signal characteristics match acute timing. There is a rim of edema which may have increased. This is a noncontrast study; no evidence of underlying mass. No vascular malformation seen on prior study. No evidence of intraventricular extension or hydrocephalus. Diffusion signal is attributed to susceptibility artifact-no definite underlying infarct. Remote microhemorrhage in the right cerebral white matter. Vascular: Normal flow voids Skull and upper cervical spine: Diffusely low marrow signal Sinuses/Orbits: Negative IMPRESSION: 1. Left thalamic hematoma with size stability since earlier CTA. The rim of edema has progressed. 2. No evidence of underlying mass or primary infarct. 3. Minimal white matter disease. Electronically Signed: By: Marnee SpringJonathon  Watts M.D. On: 10/16/2019 04:36   Dg Chest Portable 1 View  Result Date: 10/15/2019 CLINICAL DATA:  Altered mental status EXAM: PORTABLE CHEST 1 VIEW COMPARISON:  None. FINDINGS: UPPER limits normal heart size noted. There is no evidence of focal airspace disease, pulmonary edema, suspicious pulmonary nodule/mass, pleural effusion, or pneumothorax. No acute bony abnormalities are identified. IMPRESSION: 1. No evidence of active cardiopulmonary disease. 2. UPPER limits normal heart size. Electronically Signed   By: Harmon PierJeffrey  Hu M.D.   On: 10/15/2019 19:56   Dg Abd Portable 1v  Result Date: 10/16/2019 CLINICAL DATA:  NG tube  placement EXAM: PORTABLE ABDOMEN - 1 VIEW COMPARISON:  None. FINDINGS: The NG tube projects over the gastric body. The tip is  pointed distally. The visualized portions of the bowel gas are unremarkable. IMPRESSION: NG tube tip projects over the gastric body Electronically Signed   By: Katherine Mantle M.D.   On: 10/16/2019 06:33    PHYSICAL EXAM  Temp:  [97.7 F (36.5 C)-98.9 F (37.2 C)] 98.9 F (37.2 C) (11/06 1600) Pulse Rate:  [60-89] 89 (11/06 1815) Resp:  [11-27] 19 (11/06 1815) BP: (101-162)/(62-137) 116/81 (11/06 1815) SpO2:  [97 %-100 %] 99 % (11/06 1815) Weight:  [63.6 kg] 63.6 kg (11/06 1334)  General - Well nourished, well developed, lethargic.  Ophthalmologic - fundi not visualized due to noncooperation.  Cardiovascular - Regular rhythm and rate.  Neuro - lethargic but eyes open, awake, large NG tube in place, severe dysarthria. Not quite following commands, with language barrier, difficult testing language function. No gaze preference, tracking bilaterally, PERRL, EOMI. Blinking to visual threat inconsistently bilaterally. Right facial droop. Tongue protrusion not cooperative. Right UE 0/5 with pain, RLE 2/5 with pain. LUE and LLE spontaneous movement against gravity. DTR 1+ and no babinski. Sensation, coordination and gait not tested.    ASSESSMENT/PLAN Ms. Malvika Barringer is a 43 y.o. female from [place] with history of HTN found not verbally responsive.   ICH:   L thalamic hemorrhage, likely hypertensive  CT head 2.7cm L thalamic hemorrhage   CTA head & neck Unremarkable   MRI stable L thalamic hemorrhage   Repeat CT head pending  2D Echo EF 60-65%  LDL pending   HgbA1c 5.7  SCDs for VTE prophylaxis  No antithrombotic prior to admission, now on No antithrombotic given hemorrhage   Therapy recommendations:  pending   Disposition:  pending   Hypertension  Highest BP listed 154/90 in EMR  Home meds:  None listed . SBP goal < 140  Off cleviprex  now  On norvasc 5  . Long-term BP goal normotensive  Dysphagia . Secondary to stroke . NPO . Speech on board  Other Stroke Risk Factors  Smokeless tobacco user  Other Active Problems    Hospital day # 1  This patient is critically ill due to ICH and hypertensive emergency and at significant risk of neurological worsening, death form hematoma expansion, seizure, cerebral edema. This patient's care requires constant monitoring of vital signs, hemodynamics, respiratory and cardiac monitoring, review of multiple databases, neurological assessment, discussion with family, other specialists and medical decision making of high complexity. I spent 35 minutes of neurocritical care time in the care of this patient.  Marvel Plan, MD PhD Stroke Neurology 10/16/2019 6:43 PM  To contact Stroke Continuity provider, please refer to [email or web address]. After hours, contact General Neurology

## 2019-10-16 NOTE — ED Notes (Signed)
ED TO INPATIENT HANDOFF REPORT  ED Nurse Name and Phone #: Susa RaringLisa RN [phone]  S Name/Age/Gender Lindsay Howard 43 y.o. female Room/Bed: 022C/022C  Code Status   Code Status: Full Code  Home/SNF/Other Home Patient oriented to: self, place, time and situation Is this baseline? Yes   Triage Complete: Triage complete  Chief Complaint Altered  Triage Note No notes on file   Allergies No Known Allergies  Level of Care/Admitting Diagnosis ED Disposition    ED Disposition Condition Comment   Admit  [facility] [[id number]]  Level of Care: ICU [6]  Covid Evaluation: Asymptomatic Screening Protocol (No Symptoms)  Diagnosis: ICH (intracerebral hemorrhage) [facility](HCC) [[id number]][[id number]]  Admitting Physician: Otelia LimesLINDZEN, ERIC Valen.Docker[[id number]]  Attending Physician: Otelia LimesLINDZEN, ERIC Valen.Docker[[id number]]  Estimated length of stay: 5 - 7 days  Certification:: I certify this patient will need inpatient services for at least 2 midnights  PT Class (Do Not Modify): Inpatient [101]  PT Acc Code (Do Not Modify): Private [1]       B Medical/Surgery History No past medical history on file.    A IV Location/Drains/Wounds Patient Lines/Drains/Airways Status   Active Line/Drains/Airways    Name:   Placement date:   Placement time:   Site:   Days:   Peripheral IV 10/15/19 Right Antecubital   10/15/19    1927    Antecubital   1   Peripheral IV 10/15/19 Anterior;Left Hand   10/15/19    1744    Hand   1   NG/OG Tube Nasogastric 16 Fr. Right nare Aucultation   10/16/19    [id number]    Right nare   less than 1          Intake/Output Last 24 hours No intake or output data in the 24 hours ending 10/16/19 1259  Labs/Imaging Results for orders placed or performed during the hospital encounter of 10/15/19 (from the past 48 hour(s))  CBC with Differential     Status: Abnormal   Collection Time: 10/15/19  7:07 PM  Result Value Ref Range   WBC 4.9 4.0 - 10.5 K/uL   RBC 4.72 3.87 - 5.11 MIL/uL   Hemoglobin 11.9 (L)  12.0 - 15.0 g/dL   HCT 47.837.6 29.536.0 - 62.146.0 %   MCV 79.7 (L) 80.0 - 100.0 fL   MCH 25.2 (L) 26.0 - 34.0 pg   MCHC 31.6 30.0 - 36.0 g/dL   RDW 30.814.8 65.711.5 - 84.615.5 %   Platelets 239 150 - 400 K/uL   nRBC 0.0 0.0 - 0.2 %   Neutrophils Relative % 49 %   Neutro Abs 2.4 1.7 - 7.7 K/uL   Lymphocytes Relative 36 %   Lymphs Abs 1.8 0.7 - 4.0 K/uL   Monocytes Relative 13 %   Monocytes Absolute 0.6 0.1 - 1.0 K/uL   Eosinophils Relative 2 %   Eosinophils Absolute 0.1 0.0 - 0.5 K/uL   Basophils Relative 0 %   Basophils Absolute 0.0 0.0 - 0.1 K/uL   Immature Granulocytes 0 %   Abs Immature Granulocytes 0.01 0.00 - 0.07 K/uL    Comment: Performed at [facility], [address] [place], [place] [id number]  Comprehensive metabolic panel     Status: Abnormal   Collection Time: 10/15/19  7:07 PM  Result Value Ref Range   Sodium 137 135 - 145 mmol/L   Potassium 3.8 3.5 - 5.1 mmol/L   Chloride 104 98 - 111 mmol/L   CO2 23 22 - 32  mmol/L   Glucose, Bld 96 70 - 99 mg/dL   BUN 9 6 - 20 mg/dL   Creatinine, Ser 1.61 0.44 - 1.00 mg/dL   Calcium 9.0 8.9 - 09.6 mg/dL   Total Protein 8.7 (H) 6.5 - 8.1 g/dL   Albumin 3.5 3.5 - 5.0 g/dL   AST 045 (H) 15 - 41 U/L   ALT 110 (H) 0 - 44 U/L   Alkaline Phosphatase 94 38 - 126 U/L   Total Bilirubin 0.7 0.3 - 1.2 mg/dL   GFR calc non Af Amer >60 >60 mL/min   GFR calc Af Amer >60 >60 mL/min   Anion gap 10 5 - 15    Comment: Performed at [facility], [address], [place], [place] [address]  Protime-INR     Status: None   Collection Time: 10/15/19  7:07 PM  Result Value Ref Range   Prothrombin Time 13.5 11.4 - 15.2 seconds   INR 1.0 0.8 - 1.2    Comment: (NOTE) INR goal varies based on device and disease states. Performed at [facility], [address], [place], [place] [address]   APTT     Status: None   Collection Time: 10/15/19  7:07 PM  Result Value Ref Range   aPTT 33 24 - 36 seconds    Comment: Performed at [facility], [address], [place], [place] [address]  CBG monitoring, ED     Status: None   Collection Time: 10/15/19  7:30 PM  Result Value Ref Range   Glucose-Capillary 92 70 - 99 mg/dL  Lactic acid, plasma     Status: None   Collection Time: 10/15/19  7:35 PM  Result Value Ref Range   Lactic Acid, Venous 1.6 0.5 - 1.9 mmol/L    Comment: Performed at [facility], [address], [place], [place] [address]  I-Stat Beta hCG blood, ED ([facility], [facility], [facility] only)     Status: None   Collection Time: 10/15/19  7:37 PM  Result Value Ref Range   I-stat hCG, quantitative <5.0 <5 mIU/mL   Comment 3            Comment:   GEST. AGE      CONC.  (mIU/mL)   <=1 WEEK        5 - 50     2 WEEKS       50 - 500     3 WEEKS       100 - 10,000     4 WEEKS     1,000 - 30,000        FEMALE AND NON-PREGNANT FEMALE:     LESS THAN 5 mIU/mL   I-Stat Chem 8, ED     Status: None   Collection Time: 10/15/19  7:39 PM  Result Value Ref Range   Sodium 142 135 - 145 mmol/L   Potassium 3.9 3.5 - 5.1 mmol/L   Chloride 105 98 - 111 mmol/L   BUN 10 6 - 20 mg/dL   Creatinine, Ser 6.57 0.44 - 1.00 mg/dL   Glucose, Bld 90 70 - 99 mg/dL   Calcium, Ion 8.46 9.62 - 1.40 mmol/L   TCO2 26 22 - 32 mmol/L   Hemoglobin 12.9 12.0 - 15.0 g/dL   HCT 95.2 84.1 - 32.4 %  Ethanol     Status: None   Collection Time: 10/15/19  9:22 PM  Result Value Ref Range   Alcohol, Ethyl (  B) <10 <10 mg/dL    Comment: (NOTE) Lowest detectable limit for serum alcohol is 10 mg/dL. For medical purposes only. Performed at [facility], [address] [place], [place] [address]   SARS CORONAVIRUS 2 (TAT 6-24 HRS) Nasopharyngeal Nasopharyngeal Swab     Status: None   Collection Time: 10/15/19 10:00 PM   Specimen: Nasopharyngeal Swab  Result Value Ref Range   SARS Coronavirus 2 NEGATIVE NEGATIVE    Comment: (NOTE) SARS-CoV-2 target nucleic acids are NOT DETECTED. The SARS-CoV-2 RNA is generally detectable in upper and lower respiratory specimens  during the acute phase of infection. Negative results do not preclude SARS-CoV-2 infection, do not rule out co-infections with other pathogens, and should not be used as the sole basis for treatment or other patient management decisions. Negative results must be combined with clinical observations, patient history, and epidemiological information. The expected result is Negative. Fact Sheet for Patients: HairSlick.no Fact Sheet for Healthcare Providers: quierodirigir.com This test is not yet approved or cleared by the [place] FDA and  has been authorized for detection and/or diagnosis of SARS-CoV-2 by FDA under an Emergency Use Authorization (EUA). This EUA will remain  in effect (meaning this test can be used) for the duration of the COVID-19 declaration under Section 56 4(b)(1) of the Act, 21 U.S.C. section 360bbb-3(b)(1), unless the authorization is terminated or revoked sooner. Performed at [facility], [address] [place], [place] [address]   Ammonia     Status: None   Collection Time: 10/16/19  1:06 AM  Result Value Ref Range   Ammonia 28 9 - 35 umol/L    Comment: Performed at [facility], [address], [place], [place] [address]  HIV Antibody (routine testing w rflx)     Status: None   Collection Time: 10/16/19  1:06 AM  Result Value Ref Range   HIV Screen 4th Generation wRfx NON REACTIVE NON REACTIVE    Comment: Performed at [facility], [address], [place], [place] [address]  Hemoglobin A1c     Status: Abnormal   Collection Time: 10/16/19  1:06 AM  Result Value Ref Range   Hgb A1c MFr Bld 5.7 (H) 4.8 - 5.6 %    Comment: (NOTE) Pre diabetes:          5.7%-6.4% Diabetes:              >6.4% Glycemic control for   <7.0% adults with diabetes    Mean Plasma Glucose 116.89 mg/dL    Comment: Performed at [facility], [address], [place], [place] [address]  Troponin I (High  Sensitivity)     Status: None   Collection Time: 10/16/19  1:06 AM  Result Value Ref Range   Troponin I (High Sensitivity) 5 <18 ng/L    Comment: (NOTE) Elevated high sensitivity troponin I (hsTnI) values and significant  changes across serial measurements may suggest ACS but many other  chronic and acute conditions are known to elevate hsTnI results.  Refer to the "Links" section for chest pain algorithms and additional  guidance. Performed at [facility], [address], [place], [place] [address]   Urinalysis, Routine w reflex microscopic     Status: Abnormal   Collection Time: 10/16/19  6:08 AM  Result Value Ref Range   Color, Urine YELLOW YELLOW   APPearance CLEAR CLEAR   Specific Gravity, Urine 1.034 (H) 1.005 - 1.030   pH 6.0  5.0 - 8.0   Glucose, UA NEGATIVE NEGATIVE mg/dL   Hgb urine dipstick NEGATIVE NEGATIVE   Bilirubin Urine NEGATIVE NEGATIVE   Ketones, ur NEGATIVE NEGATIVE mg/dL   Protein, ur NEGATIVE NEGATIVE mg/dL   Nitrite NEGATIVE NEGATIVE   Leukocytes,Ua NEGATIVE NEGATIVE    Comment: Performed at [facility] [address] [place], [place] [address]  Urine rapid drug screen (hosp performed)     Status: None   Collection Time: 10/16/19  6:08 AM  Result Value Ref Range   Opiates NONE DETECTED NONE DETECTED   Cocaine NONE DETECTED NONE DETECTED   Benzodiazepines NONE DETECTED NONE DETECTED   Amphetamines NONE DETECTED NONE DETECTED   Tetrahydrocannabinol NONE DETECTED NONE DETECTED   Barbiturates NONE DETECTED NONE DETECTED    Comment: (NOTE) DRUG SCREEN FOR MEDICAL PURPOSES ONLY.  IF CONFIRMATION IS NEEDED FOR ANY PURPOSE, NOTIFY LAB WITHIN 5 DAYS. LOWEST DETECTABLE LIMITS FOR URINE DRUG SCREEN Drug Class                     Cutoff (ng/mL) Amphetamine and metabolites    1000 Barbiturate and metabolites    200 Benzodiazepine                 277 Tricyclics and metabolites     300 Opiates and metabolites        300 Cocaine and metabolites         300 THC                            50 Performed at [facility], [address] [place], [place] [address]   Pregnancy, urine     Status: None   Collection Time: 10/16/19  6:08 AM  Result Value Ref Range   Preg Test, Ur NEGATIVE NEGATIVE    Comment: Performed at [facility] [address] [place], [place] [address]   Ct Angio Head W Or Wo Contrast  Result Date: 10/15/2019 CLINICAL DATA:  Whether you guys doing EXAM: CT ANGIOGRAPHY HEAD AND NECK TECHNIQUE: Multidetector CT imaging of the head and neck was performed using the standard protocol during bolus administration of intravenous contrast. Multiplanar CT image reconstructions and MIPs were obtained to evaluate the vascular anatomy. Carotid stenosis measurements (when applicable) are obtained utilizing NASCET criteria, using the distal internal carotid diameter as the denominator. CONTRAST:  185mL OMNIPAQUE IOHEXOL 350 MG/ML SOLN COMPARISON:  None. FINDINGS: CT HEAD FINDINGS Brain: Acute intraparenchymal hemorrhage centered at the left thalamus measures 2.4 x 1.7 x 1.3 cm (estimated volume 2.7 cc). Surrounding low-density vasogenic edema without significant regional mass effect. No significant midline shift. No intraventricular extension. Remainder the brain is normal in appearance. No other acute intracranial hemorrhage. No acute large vessel territory infarct. No mass lesion. No extra-axial fluid collection. Vascular: No hyperdense vessel. Skull: Scalp soft tissues and calvarium within normal limits. Sinuses: Mild-to-moderate mucosal thickening within the ethmoidal air cells, sphenoid sinuses, and maxillary sinuses. Mastoid air cells are clear. Orbits: Globes and orbital soft tissues demonstrate no acute finding. Review of the MIP images confirms the above findings CTA NECK FINDINGS Aortic arch: Visualized aortic arch of normal caliber with normal 3 vessel morphology. No hemodynamically significant stenosis about the origin of  the great vessels. Visualized subclavian arteries widely patent. Right carotid system: Right common and internal carotid arteries are widely patent without stenosis, dissection, or occlusion. Right ICA medialized into the  retropharyngeal space. Left carotid system: Left common and internal carotid arteries widely patent without stenosis, dissection or occlusion. Left ICA medialized into the retropharyngeal space. Vertebral arteries: Both vertebral arteries arise from the subclavian arteries. Vertebral arteries widely patent within the neck without stenosis, dissection or occlusion. Skeleton: No acute osseous abnormality. No discrete lytic or blastic osseous lesions. Other neck: No other acute soft tissue abnormality within the neck. Upper chest: Visualized upper chest demonstrates no acute finding. Review of the MIP images confirms the above findings CTA HEAD FINDINGS Anterior circulation: Internal carotid arteries widely patent to the termini without stenosis or other abnormality. A1 segments, anterior communicating artery common anterior cerebral arteries widely patent. Both ACAs well perfused and widely patent to their distal aspects. No M1 stenosis or occlusion. Normal MCA bifurcations. Distal MCA branches well perfused and symmetric. Posterior circulation: Both vertebral arteries patent to the vertebrobasilar junction without stenosis. Right vertebral artery dominant. Posterior inferior cerebral arteries not well seen. Basilar widely patent to its distal aspect without stenosis. Superior cerebral arteries patent bilaterally. Both PCAs well perfused and widely patent to their distal aspects. Venous sinuses: Patent. Anatomic variants: None significant. No vascular abnormality seen underlying the left thalamic hemorrhage. Review of the MIP images confirms the above findings IMPRESSION: CT HEAD IMPRESSION: 2.7 cc acute intraparenchymal hemorrhage centered at the left thalamus. Mild localized edema without  significant regional mass effect. No midline shift or intraventricular extension. CTA HEAD AND NECK IMPRESSION: 1. Normal CTA of the head and neck. No large vessel occlusion or hemodynamically significant stenosis. 2. No vascular abnormality seen underlying the left thalamic hemorrhage. Critical Value/emergent results were called by telephone at the time of interpretation on 10/15/2019 at 9:10 pm to [facility] , who verbally acknowledged these results. Electronically Signed   By: Rise Mu M.D.   On: 10/15/2019 21:34   Dg Abdomen 1 View  Result Date: 10/16/2019 CLINICAL DATA:  Nasogastric tube placement for the patient pulled out her nasogastric tube earlier today. EXAM: ABDOMEN - 1 VIEW COMPARISON:  Earlier today. FINDINGS: Interval nasogastric tube with its tip and side hole in the proximal stomach. Normal bowel gas pattern. Lumbar and lower thoracic spine degenerative changes. Excreted contrast in the urinary bladder. IMPRESSION: Nasogastric tube tip and side hole in the proximal stomach. Electronically Signed   By: Beckie Salts M.D.   On: 10/16/2019 11:41   Dg Abdomen 1 View  Result Date: 10/16/2019 CLINICAL DATA:  Nasogastric tube placement. EXAM: ABDOMEN - 1 VIEW COMPARISON:  Earlier today. FINDINGS: The previously demonstrated nasogastric tube is no longer visualized. Normal bowel gas pattern. Lumbar and lower thoracic spine degenerative changes. IMPRESSION: No acute abnormality. The previously seen nasogastric tube is no longer visualized. Electronically Signed   By: Beckie Salts M.D.   On: 10/16/2019 11:40   Ct Angio Neck W And/or Wo Contrast  Result Date: 10/15/2019 CLINICAL DATA:  Whether you guys doing EXAM: CT ANGIOGRAPHY HEAD AND NECK TECHNIQUE: Multidetector CT imaging of the head and neck was performed using the standard protocol during bolus administration of intravenous contrast. Multiplanar CT image reconstructions and MIPs were obtained to evaluate the vascular  anatomy. Carotid stenosis measurements (when applicable) are obtained utilizing NASCET criteria, using the distal internal carotid diameter as the denominator. CONTRAST:  OMNIPAQUE IOHEXOL 350 MG/ML SOLN COMPARISON:  None. FINDINGS: CT HEAD FINDINGS Brain: Acute intraparenchymal hemorrhage centered at the left thalamus measures 2.4 x 1.7 x 1.3 cm (estimated volume 2.7 cc). Surrounding low-density vasogenic edema without  significant regional mass effect. No significant midline shift. No intraventricular extension. Remainder the brain is normal in appearance. No other acute intracranial hemorrhage. No acute large vessel territory infarct. No mass lesion. No extra-axial fluid collection. Vascular: No hyperdense vessel. Skull: Scalp soft tissues and calvarium within normal limits. Sinuses: Mild-to-moderate mucosal thickening within the ethmoidal air cells, sphenoid sinuses, and maxillary sinuses. Mastoid air cells are clear. Orbits: Globes and orbital soft tissues demonstrate no acute finding. Review of the MIP images confirms the above findings CTA NECK FINDINGS Aortic arch: Visualized aortic arch of normal caliber with normal 3 vessel morphology. No hemodynamically significant stenosis about the origin of the great vessels. Visualized subclavian arteries widely patent. Right carotid system: Right common and internal carotid arteries are widely patent without stenosis, dissection, or occlusion. Right ICA medialized into the retropharyngeal space. Left carotid system: Left common and internal carotid arteries widely patent without stenosis, dissection or occlusion. Left ICA medialized into the retropharyngeal space. Vertebral arteries: Both vertebral arteries arise from the subclavian arteries. Vertebral arteries widely patent within the neck without stenosis, dissection or occlusion. Skeleton: No acute osseous abnormality. No discrete lytic or blastic osseous lesions. Other neck: No other acute soft tissue  abnormality within the neck. Upper chest: Visualized upper chest demonstrates no acute finding. Review of the MIP images confirms the above findings CTA HEAD FINDINGS Anterior circulation: Internal carotid arteries widely patent to the termini without stenosis or other abnormality. A1 segments, anterior communicating artery common anterior cerebral arteries widely patent. Both ACAs well perfused and widely patent to their distal aspects. No M1 stenosis or occlusion. Normal MCA bifurcations. Distal MCA branches well perfused and symmetric. Posterior circulation: Both vertebral arteries patent to the vertebrobasilar junction without stenosis. Right vertebral artery dominant. Posterior inferior cerebral arteries not well seen. Basilar widely patent to its distal aspect without stenosis. Superior cerebral arteries patent bilaterally. Both PCAs well perfused and widely patent to their distal aspects. Venous sinuses: Patent. Anatomic variants: None significant. No vascular abnormality seen underlying the left thalamic hemorrhage. Review of the MIP images confirms the above findings IMPRESSION: CT HEAD IMPRESSION: 2.7 cc acute intraparenchymal hemorrhage centered at the left thalamus. Mild localized edema without significant regional mass effect. No midline shift or intraventricular extension. CTA HEAD AND NECK IMPRESSION: 1. Normal CTA of the head and neck. No large vessel occlusion or hemodynamically significant stenosis. 2. No vascular abnormality seen underlying the left thalamic hemorrhage. Critical Value/emergent results were called by telephone at the time of interpretation on 10/15/2019 at 9:10 pm to [facility] , who verbally acknowledged these results. Electronically Signed   By: Rise Mu M.D.   On: 10/15/2019 21:34   Mr Brain Wo Contrast  Addendum Date: 10/16/2019   ADDENDUM REPORT: 10/16/2019 06:34 ADDENDUM: Omitted impression #4 of generally low marrow signal, please correlate with  CBC. Electronically Signed   By: Marnee Spring M.D.   On: 10/16/2019 06:34   Result Date: 10/16/2019 CLINICAL DATA:  Intracranial hemorrhage EXAM: MRI HEAD WITHOUT CONTRAST TECHNIQUE: Multiplanar, multiecho pulse sequences of the brain and surrounding structures were obtained without intravenous contrast. COMPARISON:  CTA head neck from yesterday FINDINGS: Brain: When measured on T1 and T2 weighted imaging there is a size stable left thalamic hematoma extending towards the upper midbrain which measures 14 x 22 mm on axial slices. Signal characteristics match acute timing. There is a rim of edema which may have increased. This is a noncontrast study; no evidence of underlying mass. No vascular malformation seen  on prior study. No evidence of intraventricular extension or hydrocephalus. Diffusion signal is attributed to susceptibility artifact-no definite underlying infarct. Remote microhemorrhage in the right cerebral white matter. Vascular: Normal flow voids Skull and upper cervical spine: Diffusely low marrow signal Sinuses/Orbits: Negative IMPRESSION: 1. Left thalamic hematoma with size stability since earlier CTA. The rim of edema has progressed. 2. No evidence of underlying mass or primary infarct. 3. Minimal white matter disease. Electronically Signed: By: Marnee Spring M.D. On: 10/16/2019 04:36   Dg Chest Portable 1 View  Result Date: 10/15/2019 CLINICAL DATA:  Altered mental status EXAM: PORTABLE CHEST 1 VIEW COMPARISON:  None. FINDINGS: UPPER limits normal heart size noted. There is no evidence of focal airspace disease, pulmonary edema, suspicious pulmonary nodule/mass, pleural effusion, or pneumothorax. No acute bony abnormalities are identified. IMPRESSION: 1. No evidence of active cardiopulmonary disease. 2. UPPER limits normal heart size. Electronically Signed   By: Harmon Pier M.D.   On: 10/15/2019 19:56   Dg Abd Portable 1v  Result Date: 10/16/2019 CLINICAL DATA:  NG tube placement EXAM:  PORTABLE ABDOMEN - 1 VIEW COMPARISON:  None. FINDINGS: The NG tube projects over the gastric body. The tip is pointed distally. The visualized portions of the bowel gas are unremarkable. IMPRESSION: NG tube tip projects over the gastric body Electronically Signed   By: Katherine Mantle M.D.   On: 10/16/2019 06:33    Pending Labs Unresulted Labs (From admission, onward)    Start     Ordered   10/17/19 0500  CBC  Daily,   R     10/16/19 0904   10/17/19 0500  Basic metabolic panel  Daily,   R     10/16/19 0904   10/17/19 0500  Lipid panel  Tomorrow morning,   R     10/16/19 0904          Vitals/Pain Today's Vitals   10/16/19 1030 10/16/19 1045 10/16/19 1100 10/16/19 1115  BP: 124/73 (!) 144/78 134/74 (!) 139/124  Pulse: 71 72 73 79  Resp: Temp:      TempSrc:      SpO2: 100% 100% 100% 99%    Isolation Precautions No active isolations  Medications Medications   stroke: mapping our early stages of recovery book ( Does not apply Not Given 10/15/19 2306)  acetaminophen (TYLENOL) tablet 650 mg (has no administration in time range)    Or  acetaminophen (TYLENOL) 160 MG/5ML solution 650 mg (has no administration in time range)    Or  acetaminophen (TYLENOL) suppository 650 mg (has no administration in time range)  senna-docusate (Senokot-S) tablet 1 tablet (1 tablet Oral Not Given 10/16/19 1124)  pantoprazole (PROTONIX) injection 40 mg (40 mg Intravenous Given 10/15/19 2358)  labetalol (NORMODYNE) injection 20 mg (20 mg Intravenous Given 10/16/19 0140)    And  clevidipine (CLEVIPREX) infusion 0.5 mg/mL (0 mg/hr Intravenous Hold 10/15/19 2353)  amLODipine (NORVASC) tablet 5 mg (5 mg Oral Not Given 10/16/19 1124)  0.9 %  sodium chloride infusion (has no administration in time range)  iohexol (OMNIPAQUE) 350 MG/ML injection 100 mL (100 mLs Intravenous Contrast Given 10/15/19 2022)  lidocaine (XYLOCAINE) 2 % viscous mouth solution 15 mL (15 mLs Mouth/Throat Given 10/16/19 0610)     Mobility walks     Focused Assessments Neuro Assessment Handoff:  Swallow screen pass? No    NIH Stroke Scale ( + Modified Stroke Scale Criteria)  Level of Consciousness (1a.)   : Alert, keenly  responsive LOC Questions (1b. )   +: Answers neither question correctly LOC Commands (1c. )   + : Performs both tasks correctly Best Gaze (2. )  +: Normal Visual (3. )  +: No visual loss Facial Palsy (4. )    : Minor paralysis Motor Arm, Left (5a. )   +: No drift Motor Arm, Right (5b. )   +: No effort against gravity Motor Leg, Left (6a. )   +: No drift Motor Leg, Right (6b. )   +: Some effort against gravity Limb Ataxia (7. ): Present in one limb Sensory (8. )   +: Mild-to-moderate sensory loss, patient feels pinprick is less sharp or is dull on the affected side, or there is a loss of superficial pain with pinprick, but patient is aware of being touched Best Language (9. )   +: Mild-to-moderate aphasia Dysarthria (10. ): Severe dysarthria, patient's speech is so slurred as to be unintelligible in the absence of or out of proportion to any dysphasia, or is mute/anarthric Extinction/Inattention (11.)   +: No Abnormality Modified SS Total  +: 9 Complete NIHSS TOTAL: 16 Last date known well: 10/15/19 Last time known well: 1900 Neuro Assessment:   Neuro Checks:      Last Documented NIHSS Modified Score: 9 (10/16/19 7412) Has TPA been given? No If patient is a Neuro Trauma and patient is going to OR before floor call report to 4N Charge nurse: [phone] or [phone]     R Recommendations: See Admitting Provider Note  Report given to:   Additional Notes:

## 2019-10-17 ENCOUNTER — Inpatient Hospital Stay (HOSPITAL_COMMUNITY): Payer: Medicaid Other

## 2019-10-17 DIAGNOSIS — I610 Nontraumatic intracerebral hemorrhage in hemisphere, subcortical: Secondary | ICD-10-CM | POA: Diagnosis not present

## 2019-10-17 LAB — CBC
HCT: 37.7 % (ref 36.0–46.0)
Hemoglobin: 12 g/dL (ref 12.0–15.0)
MCH: 25.2 pg — ABNORMAL LOW (ref 26.0–34.0)
MCHC: 31.8 g/dL (ref 30.0–36.0)
MCV: 79 fL — ABNORMAL LOW (ref 80.0–100.0)
Platelets: 242 10*3/uL (ref 150–400)
RBC: 4.77 MIL/uL (ref 3.87–5.11)
RDW: 14.9 % (ref 11.5–15.5)
WBC: 6.3 10*3/uL (ref 4.0–10.5)
nRBC: 0 % (ref 0.0–0.2)

## 2019-10-17 LAB — BASIC METABOLIC PANEL
Anion gap: 11 (ref 5–15)
BUN: 10 mg/dL (ref 6–20)
CO2: 22 mmol/L (ref 22–32)
Calcium: 8.8 mg/dL — ABNORMAL LOW (ref 8.9–10.3)
Chloride: 104 mmol/L (ref 98–111)
Creatinine, Ser: 0.74 mg/dL (ref 0.44–1.00)
GFR calc Af Amer: >60 mL/min (ref >60)
GFR calc non Af Amer: >60 mL/min (ref >60)
Glucose, Bld: 101 mg/dL — ABNORMAL HIGH (ref 70–99)
Potassium: 3.6 mmol/L (ref 3.5–5.1)
Sodium: 137 mmol/L (ref 135–145)

## 2019-10-17 LAB — LIPID PANEL
Cholesterol: 177 mg/dL (ref 0–200)
HDL: 23 mg/dL — ABNORMAL LOW (ref >40)
LDL Cholesterol: 123 mg/dL — ABNORMAL HIGH (ref 0–99)
Total CHOL/HDL Ratio: 7.7 RATIO
Triglycerides: 156 mg/dL — ABNORMAL HIGH (ref <150)
VLDL: 31 mg/dL (ref 0–40)

## 2019-10-17 MED ORDER — HYDRALAZINE HCL 20 MG/ML IJ SOLN
10.0000 mg | INTRAMUSCULAR | Status: DC | PRN
  Filled 2019-10-17: qty 1

## 2019-10-17 NOTE — Progress Notes (Signed)
PT Cancellation Note  Patient Details Name: Lindsay Howard MRN: [id number] DOB: 10/26/1976   Cancelled Treatment:    Reason Eval/Treat Not Completed: Active bedrest order. PT to complete PT eval once activity orders are advanced. Acute PT to return able, as appropriate.  Kittie Plater, PT, DPT Acute Rehabilitation Services Pager #: [phone] Office #: [phone]    [name] 10/17/2019, 12:07 PM

## 2019-10-17 NOTE — Progress Notes (Signed)
SLP Cancellation Note  Patient Details Name: Lindsay Howard MRN: [id number] DOB: Mar 13, 1976   Cancelled treatment:       Reason Eval/Treat Not Completed: Other: Radiology unable to assist with MBS today due to scheduling.  Will re-attempt tomorrow.     Colin Mulders., M.S., [place] Acute Rehabilitation Services Office: [phone]   [place] 10/17/2019, 2:27 PM

## 2019-10-17 NOTE — Progress Notes (Signed)
OT Cancellation Note  Patient Details Name: Lindsay Howard MRN: [id number] DOB: Feb 09, 1976   Cancelled Treatment:    Reason Eval/Treat Not Completed: Active bedrest order(Will return as schedule allows. Thank you.)  [place], OTR/L Acute Rehab Pager: [phone] Office: [phone] 10/17/2019, 9:06 AM

## 2019-10-17 NOTE — Progress Notes (Signed)
STROKE TEAM PROGRESS NOTE   INTERVAL HISTORY Pt son at bedside. Pt not english speaking, son as interpretor and at bedside. However, pt only able to tell me her name and son's name, all other answer's not understandable by son. Not quite following commands. 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 Has large NG tube placed.   Vitals:   10/17/19 0400 10/17/19 0500 10/17/19 0600 10/17/19 0700  BP: 98/70 100/77 128/69 121/85  Pulse: 83 84 86 78  Resp: 20 19 18 16   Temp: 98.5 F (36.9 C)     TempSrc: Oral     SpO2: 97% 98% 97% 96%  Weight:      Height:        CBC:  Recent Labs  Lab 10/15/19 1907 10/15/19 1939 10/17/19 0432  WBC 4.9  --  6.3  NEUTROABS 2.4  --   --   HGB 11.9* 12.9 12.0  HCT 37.6 38.0 37.7  MCV 79.7*  --  79.0*  PLT 239  --  242    Basic Metabolic Panel:  Recent Labs  Lab 10/15/19 1907 10/15/19 1939 10/17/19 0432  NA 137 142 137  K 3.8 3.9 3.6  CL 104 105 104  CO2 23  --  22  GLUCOSE 96 90 101*  BUN 9 10 10   CREATININE 0.73 0.60 0.74  CALCIUM 9.0  --  8.8*   Lipid Panel:     Component Value Date/Time   CHOL 177 10/17/2019 0432   TRIG 156 (H) 10/17/2019 0432   HDL 23 (L) 10/17/2019 0432   CHOLHDL 7.7 10/17/2019 0432   VLDL 31 10/17/2019 0432   LDLCALC 123 (H) 10/17/2019 0432   HgbA1c:  Lab Results  Component Value Date   HGBA1C 5.7 (H) 10/16/2019   Urine Drug Screen:     Component Value Date/Time   LABOPIA NONE DETECTED 10/16/2019 0608   COCAINSCRNUR NONE DETECTED 10/16/2019 0608   LABBENZ NONE DETECTED 10/16/2019 0608   AMPHETMU NONE DETECTED 10/16/2019 0608   THCU NONE DETECTED 10/16/2019 0608   LABBARB NONE DETECTED 10/16/2019 0608    Alcohol Level     Component Value Date/Time   ETH <10 10/15/2019 2122    IMAGING  Ct Angio Head W Or Wo Contrast Ct Angio Neck W And/or Wo Contrast 10/15/2019   CT HEAD  IMPRESSION:  2.7 cc acute intraparenchymal hemorrhage centered at the left thalamus. Mild localized edema without significant  regional mass effect. No midline shift or intraventricular extension.   CTA HEAD AND NECK  IMPRESSION:  1. Normal CTA of the head and neck. No large vessel occlusion or hemodynamically significant stenosis.  2. No vascular abnormality seen underlying the left thalamic hemorrhage.   Dg Abdomen 1 View 10/16/2019 IMPRESSION:  Nasogastric tube tip and side hole in the proximal stomach.  Dg Abdomen 1 View 10/16/2019 IMPRESSION:  No acute abnormality. The previously seen nasogastric tube is no longer visualized.   Ct Head Wo Contrast 10/16/2019 IMPRESSION:  1. No significant interval change in size of left thalamic intraparenchymal hemorrhage, with mildly increased surrounding vasogenic edema with trace localized left-to-right midline shift. No hydrocephalus or ventricular trapping. No intraventricular extension of hemorrhage.  2. No other new acute intracranial abnormality.   Mr Brain Wo Contrast 10/16/2019   ADDENDUM: Omitted impression #4 of generally low marrow signal, please correlate with CBC.  IMPRESSION:  1. Left thalamic hematoma with size stability since earlier CTA. The rim of edema has progressed.  2. No evidence of underlying mass or primary  infarct.  3. Minimal white matter disease.   Dg Chest Portable 1 View 10/15/2019 IMPRESSION:  1. No evidence of active cardiopulmonary disease.  2. UPPER limits normal heart size.   Dg Abd Portable 1v 10/16/2019 IMPRESSION:  NG tube tip projects over the gastric body    PHYSICAL EXAM  Temp:  [97.7 F (36.5 C)-98.9 F (37.2 C)] 98.5 F (36.9 C) (11/07 0400) Pulse Rate:  [71-90] 78 (11/07 0700) Resp:  [11-27] 16 (11/07 0700) BP: (98-162)/(66-137) 121/85 (11/07 0700) SpO2:  [96 %-100 %] 96 % (11/07 0700) Weight:  [63.6 kg] 63.6 kg (11/06 1334)  General - Well nourished, well developed, lethargic.but eyes open and responsive  Ophthalmologic - fundi not visualized due to noncooperation.  Cardiovascular - Regular rhythm and  rate.  Neuro - lethargic but eyes open, awake, large NG tube in place, severe dysarthria. following commands with visual cues, language barrier, difficult testing language function. Conjugate midline gaze tracks examiner across the room, PERRL, EOMI. Blinking to visual threat bilaterally. Right facial droop. Tongue protrusion not cooperative. Right UE flicker with pain, RLE 2/5 with pain. LUE and LLE spontaneous movement against gravity. DTR 1+ right upgoing toe. Sensation, coordination and gait not tested.  ASSESSMENT/PLAN [name] is a 43 y.o. female from [place] with history of HTN found not verbally responsive.   ICH:   L thalamic hemorrhage, likely hypertensive  CT head 2.7cm L thalamic hemorrhage   CTA head & neck Unremarkable   MRI - 10/16/19 - stable L thalamic hemorrhage   Repeat CT head 10/16/19 - No significant interval change - the rim of edema has progressed - generally low marrow signal, please correlate with CBC (low MCV and MCH - Hb normal)  2D Echo EF 60-65%  LDL - 123  HgbA1c 5.7  SCDs for VTE prophylaxis  No antithrombotic prior to admission, now on No antithrombotic given hemorrhage   Therapy recommendations:  pending   Disposition:  pending   Hypertension  Highest BP listed 154/90 in EMR  Home meds:  None listed . SBP goal < 140 . SBP 90s - 120s today  Off cleviprex now, will stop and give prns, consider increasing norvasc or adding oral agent (Family does not know what her home meds were)  On norvasc 5  . Long-term BP goal normotensive  Dysphagia . Secondary to stroke . NPO . Speech on board  Other Stroke Risk Factors  Smokeless tobacco user  Other [facility] day # 2 This patient is critically ill due to [place] and at significant risk of neurological worsening, death and care requires constant monitoring of vital signs, hemodynamics,respiratory and cardiac monitoring,review of multiple databases, neurological assessment,  discussion with family, other specialists and medical decision making of high complexity.I  I spent 30 minutes of neurocritical care time in the care of this patient.  Sarina Ill, MD [facility]     10/17/2019 9:46 AM  To contact Stroke Continuity provider, please refer to [email or web address] After hours, contact General Neurology

## 2019-10-17 NOTE — Evaluation (Addendum)
Clinical/Bedside Swallow Evaluation Patient Details  Name: Lindsay Howard MRN: [id number] Date of Birth: 23-Jun-1976  Today's Date: 10/17/2019 Time: SLP Start Time (ACUTE ONLY): 1033 SLP Stop Time (ACUTE ONLY): 1050 SLP Time Calculation (min) (ACUTE ONLY): 17 min  Past Medical History: History reviewed. No pertinent past medical history. Past Surgical History: The histories are not reviewed yet. Please review them in the "History" navigator section and refresh this SmartLink. HPI:  Lindsay Howard is a 43 y.o. female with a PMHx only of HTN, not on any daily medication. Only prior surgical history is c-section.  Head CT on 11/5 revealed "Acute intraparenchymal hemorrhage centered at the left thalamus measures 2.4 x 1.7 x 1.3 cm (estimated volume 2.7 cc). Surrounding low-density vasogenic edema without significant regional mass effect. No significant midline shift. No intraventricular extension."   Assessment / Plan / Recommendation Clinical Impression  Pt was seen for a bedside swallow evaluation in the setting of a L thalamic hemorrhage.  Pt was encountered awake/alert with RN and son present at bedside.  Oral mech exam was remarkable for R labial droop.  Pt consumed trials of ice chips, thin liquid, nectar-thick liquid (NTL), puree, and regular solids.  She exhibited good bolus acceptance and consistent hyolaryngeal elevation/excursion with all trials.  She was observed to be mildly impulsive during liquid trials and she exhibited an immediate cough following 3/15 thin liquid via straw sip trials.  No additional s/sx of aspiration were observed with ice chips, NTL, puree, or regular solids.  She exhibited prolonged mastication and AP transport of regular solid trials and she appeared fatigued following 2 small trials.  Recommend initiation of Dysphagia 1 (puree) solids and nectar-thick liquids with medications crushed in puree and an MBS to further evaluate swallow function.  Additionally recommend the following  compensatory strategies: 1) Small bites/sips 2) Slow rate of intake 3) Sit upright 90 degrees.    SLP Visit Diagnosis: Dysphagia, unspecified (R13.10)    Aspiration Risk  Mild aspiration risk    Diet Recommendation Dysphagia 1 (Puree);Nectar-thick liquid   Liquid Administration via: Cup;Straw Medication Administration: Crushed with puree Supervision: Staff to assist with self feeding;Intermittent supervision to cue for compensatory strategies Compensations: Slow rate;Small sips/bites Postural Changes: Seated upright at 90 degrees    Other  Recommendations Oral Care Recommendations: Oral care BID Other Recommendations: Order thickener from pharmacy   Follow up Recommendations Other (comment)(TBD)      Frequency and Duration min 2x/week  2 weeks       Prognosis Prognosis for Safe Diet Advancement: Good      Swallow Study   General HPI: Lindsay Howard is an 43 y.o. female with a PMHx only of HTN, not on any daily medication. Only prior surgical history is c-section.  Head CT on 11/5 revealed "Acute intraparenchymal hemorrhage centered at the left thalamus measures 2.4 x 1.7 x 1.3 cm (estimated volume 2.7 cc). Surrounding low-density vasogenic edema without significant regional mass effect. No significant midline shift. No intraventricular extension." Type of Study: Bedside Swallow Evaluation Previous Swallow Assessment: None  Diet Prior to this Study: NPO Temperature Spikes Noted: No Respiratory Status: Room air History of Recent Intubation: No Behavior/Cognition: Alert;Cooperative Oral Cavity Assessment: Dry Oral Care Completed by SLP: No Oral Cavity - Dentition: Adequate natural dentition Self-Feeding Abilities: Able to feed self;Needs set up Patient Positioning: Upright in bed Baseline Vocal Quality: Low vocal intensity Volitional Swallow: Able to elicit    Oral/Motor/Sensory Function Overall Oral Motor/Sensory Function: Mild impairment Facial ROM: Reduced right Facial  Symmetry: Abnormal symmetry right Facial Sensation: Within Functional Limits Lingual ROM: Reduced right;Reduced left   Ice Chips Ice chips: Within functional limits Presentation: Spoon   Thin Liquid Thin Liquid: Impaired Presentation: Cup;Spoon;Straw Pharyngeal  Phase Impairments: Cough - Immediate;Suspected delayed Swallow    Nectar Thick Nectar Thick Liquid: Within functional limits Presentation: Straw   Honey Thick Honey Thick Liquid: Not tested   Puree Puree: Within functional limits Presentation: Self Fed   Solid     Solid: Impaired Presentation: Self Fed Oral Phase Impairments: Impaired mastication Oral Phase Functional Implications: Prolonged oral transit;Impaired mastication     Colin Mulders., M.S., CCC-SLP Acute Rehabilitation Services Office: [phone]  [place] 10/17/2019,11:09 AM

## 2019-10-18 ENCOUNTER — Inpatient Hospital Stay (HOSPITAL_COMMUNITY): Payer: Medicaid Other

## 2019-10-18 DIAGNOSIS — I610 Nontraumatic intracerebral hemorrhage in hemisphere, subcortical: Secondary | ICD-10-CM | POA: Diagnosis not present

## 2019-10-18 LAB — CBC
HCT: 36.8 % (ref 36.0–46.0)
Hemoglobin: 11.9 g/dL — ABNORMAL LOW (ref 12.0–15.0)
MCH: 25.3 pg — ABNORMAL LOW (ref 26.0–34.0)
MCHC: 32.3 g/dL (ref 30.0–36.0)
MCV: 78.1 fL — ABNORMAL LOW (ref 80.0–100.0)
Platelets: 219 10*3/uL (ref 150–400)
RBC: 4.71 MIL/uL (ref 3.87–5.11)
RDW: 14.6 % (ref 11.5–15.5)
WBC: 6.9 10*3/uL (ref 4.0–10.5)
nRBC: 0 % (ref 0.0–0.2)

## 2019-10-18 LAB — BASIC METABOLIC PANEL
Anion gap: 11 (ref 5–15)
BUN: 8 mg/dL (ref 6–20)
CO2: 22 mmol/L (ref 22–32)
Calcium: 8.6 mg/dL — ABNORMAL LOW (ref 8.9–10.3)
Chloride: 104 mmol/L (ref 98–111)
Creatinine, Ser: 0.71 mg/dL (ref 0.44–1.00)
GFR calc Af Amer: >60 mL/min (ref >60)
GFR calc non Af Amer: >60 mL/min (ref >60)
Glucose, Bld: 90 mg/dL (ref 70–99)
Potassium: 3.8 mmol/L (ref 3.5–5.1)
Sodium: 137 mmol/L (ref 135–145)

## 2019-10-18 MED ORDER — HYDRALAZINE HCL 20 MG/ML IJ SOLN: 10.0000 mg | INTRAMUSCULAR | Status: DC | PRN

## 2019-10-18 MED ORDER — PANTOPRAZOLE SODIUM 40 MG PO TBEC
40.0000 mg | DELAYED_RELEASE_TABLET | Freq: Every day | ORAL | Status: DC
  Administered 2019-10-18 – 2019-10-20 (×3): 40 mg via ORAL
  Filled 2019-10-18 (×3): qty 1

## 2019-10-18 NOTE — Progress Notes (Signed)
STROKE TEAM PROGRESS NOTE   INTERVAL HISTORY Removed NG tube swallowing well. Son not at bedside this morning, language barrier, cannot use interpreter line  Vitals:   10/18/19 0800 10/18/19 0900 10/18/19 1000 10/18/19 1100  BP: 109/68 109/73 120/83 119/84  Pulse: 81 80 77 76  Resp: 16 16 18  (!) 21  Temp: 98.8 F (37.1 C)     TempSrc: Axillary     SpO2: 97% 99% 95% 96%  Weight:      Height:        CBC:  Recent Labs  Lab 10/15/19 1907  10/17/19 0432 10/18/19 0551  WBC 4.9  --  6.3 6.9  NEUTROABS 2.4  --   --   --   HGB 11.9*   < > 12.0 11.9*  HCT 37.6   < > 37.7 36.8  MCV 79.7*  --  79.0* 78.1*  PLT 239  --  242 219   < > = values in this interval not displayed.    Basic Metabolic Panel:  Recent Labs  Lab 10/17/19 0432 10/18/19 0551  NA 137 137  K 3.6 3.8  CL 104 104  CO2 22 22  GLUCOSE 101* 90  BUN 10 8  CREATININE 0.74 0.71  CALCIUM 8.8* 8.6*   Lipid Panel:     Component Value Date/Time   CHOL 177 10/17/2019 0432   TRIG 156 (H) 10/17/2019 0432   HDL 23 (L) 10/17/2019 0432   CHOLHDL 7.7 10/17/2019 0432   VLDL 31 10/17/2019 0432   LDLCALC 123 (H) 10/17/2019 0432   HgbA1c:  Lab Results  Component Value Date   HGBA1C 5.7 (H) 10/16/2019   Urine Drug Screen:     Component Value Date/Time   LABOPIA NONE DETECTED 10/16/2019 0608   COCAINSCRNUR NONE DETECTED 10/16/2019 0608   LABBENZ NONE DETECTED 10/16/2019 0608   AMPHETMU NONE DETECTED 10/16/2019 0608   THCU NONE DETECTED 10/16/2019 0608   LABBARB NONE DETECTED 10/16/2019 0608    Alcohol Level     Component Value Date/Time   ETH <10 10/15/2019 2122    IMAGING  Ct Angio Head W Or Wo Contrast Ct Angio Neck W And/or Wo Contrast 10/15/2019   CT HEAD  IMPRESSION:  2.7 cc acute intraparenchymal hemorrhage centered at the left thalamus. Mild localized edema without significant regional mass effect. No midline shift or intraventricular extension.   CTA HEAD AND NECK  IMPRESSION:  1. Normal  CTA of the head and neck. No large vessel occlusion or hemodynamically significant stenosis.  2. No vascular abnormality seen underlying the left thalamic hemorrhage.   Dg Abdomen 1 View 10/16/2019 IMPRESSION:  Nasogastric tube tip and side hole in the proximal stomach.  Dg Abdomen 1 View 10/16/2019 IMPRESSION:  No acute abnormality. The previously seen nasogastric tube is no longer visualized.   Ct Head Wo Contrast 10/16/2019 IMPRESSION:  1. No significant interval change in size of left thalamic intraparenchymal hemorrhage, with mildly increased surrounding vasogenic edema with trace localized left-to-right midline shift. No hydrocephalus or ventricular trapping. No intraventricular extension of hemorrhage.  2. No other new acute intracranial abnormality.   Mr Brain Wo Contrast 10/16/2019   ADDENDUM: Omitted impression #4 of generally low marrow signal, please correlate with CBC.  IMPRESSION:  1. Left thalamic hematoma with size stability since earlier CTA. The rim of edema has progressed.  2. No evidence of underlying mass or primary infarct.  3. Minimal white matter disease.   Dg Chest Portable 1 View 10/15/2019 IMPRESSION:  1. No evidence of active cardiopulmonary disease.  2. UPPER limits normal heart size.   Dg Abd Portable 1v 10/16/2019 IMPRESSION:  NG tube tip projects over the gastric body    PHYSICAL EXAM  Temp:  [98.2 F (36.8 C)-98.8 F (37.1 C)] 98.8 F (37.1 C) (11/08 0800) Pulse Rate:  [64-85] 76 (11/08 1100) Resp:  [14-21] 21 (11/08 1100) BP: (97-134)/(63-93) 119/84 (11/08 1100) SpO2:  [95 %-99 %] 96 % (11/08 1100)  General - Well nourished, well developed, lethargic.but eyes open and responsive  Ophthalmologic - fundi not visualized due to noncooperation.  Cardiovascular - Regular rhythm and rate.  Neuro - lethargic but eyes open, awake, large NG tube in place, severe dysarthria. following commands with visual cues, language barrier, difficult  testing language function. Conjugate midline gaze tracks examiner across the room, PERRL, EOMI. Blinking to visual threat bilaterally. Right facial droop. Tongue protrusion not cooperative. Right UE flicker with pain, RLE 2/5 with pain. LUE and LLE spontaneous movement against gravity. DTR 1+ right upgoing toe. Sensation, coordination and gait not tested.  ASSESSMENT/PLAN [name] is a 43 y.o. female from [place] with history of HTN found not verbally responsive.   ICH:   L thalamic hemorrhage, likely hypertensive  CT head 2.7cm L thalamic hemorrhage   CTA head & neck Unremarkable   MRI - 10/16/19 - stable L thalamic hemorrhage   Repeat CT head 10/16/19 - No significant interval change - the rim of edema has progressed - generally low marrow signal, please correlate with CBC (low MCV and MCH - Hb normal)  2D Echo EF 60-65%  LDL - 123  HgbA1c 5.7  SCDs for VTE prophylaxis  No antithrombotic prior to admission, now on No antithrombotic given hemorrhage   Therapy recommendations:  pending   Disposition:  pending   Hypertension  Highest BP listed 154/90 in EMR  Home meds:  None listed . SBP goal < 140 . SBP 90s - 120s today  Off cleviprex now, will stop and give prns, consider increasing norvasc or adding oral agent (Family does not know what her home meds were)  On norvasc 5  . Long-term BP goal normotensive  Dysphagia . Secondary to stroke . NPO . Speech on board  Other Stroke Risk Factors  Smokeless tobacco user  Other Active Problems  Personally examined patient and images, and have participated in and made any corrections needed to history, physical, neuro exam,assessment and plan as stated above.  I have personally obtained the history, evaluated lab date, reviewed imaging studies and agree with radiology interpretations.    Sarina Ill, MD Stroke Neurology   A total of 25 minutes was spent for the care of this patient, spent on counseling patient and  family on different diagnostic and therapeutic options, counseling and coordination of care, riskd ans benefits of management, compliance, or risk factor reduction and education.    10/18/2019 12:05 PM  To contact Stroke Continuity provider, please refer to [email or web address] After hours, contact General Neurology

## 2019-10-18 NOTE — Evaluation (Signed)
Physical Therapy Evaluation Patient Details Name: Lindsay Howard MRN: [id number] DOB: Jul 17, 1976 Today's Date: 10/18/2019   History of Present Illness  Lindsay Howard is an 43 y.o. female with a PMHx only of HTN, not on any daily medication. Pt admitted after beign found moaning in bed and not verbally responsive. 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   Clinical Impression  Pt admitted with above. Son present and acted as Nurse, learning disability. Pt with noted expressive aphasia even in native language. Pt with R hemiplegia UE worse than L LE. Pt followed simple commands 50% of time. Pt with noted R LE ataxia, impaired coo-ordination, and impaired motor planning requiring maxA x2 for ambulation. Pt motivated and was indep PTA. Recommend CIR upon d/c for maximal functional recovery. Pt was indep PTA and has good family support and home set up.     Follow Up Recommendations CIR    Equipment Recommendations  (TBD at next venue)    Recommendations for Other Services Rehab consult     Precautions / Restrictions Precautions Precautions: Fall Restrictions Weight Bearing Restrictions: No      Mobility  Bed Mobility Overal bed mobility: Needs Assistance Bed Mobility: Supine to Sit     Supine to sit: Max assist     General bed mobility comments: pt initiated LE movement to EOB, pt attempted to elevate trunk but ultimately required maxA  Transfers Overall transfer level: Needs assistance Equipment used: 2 person hand held assist Transfers: Sit to/from Stand Sit to Stand: +2 physical assistance;+2 safety/equipment;Mod assist         General transfer comment: modA to steady during powering up, unable to use R UE functionally to assist  Ambulation/Gait Ambulation/Gait assistance: Max assist;+2 physical assistance;+2 safety/equipment Gait Distance (Feet): 10 Feet(x2, to/from bathroom) Assistive device: 2 person hand held assist Gait Pattern/deviations: Step-through  pattern;Decreased step length - right;Decreased stance time - right;Ataxic;Scissoring Gait velocity: slow   General Gait Details: Pt requiring modA for advancement of R LE due to impaired sequencing and ataxic/scissoring like movement. Pt with noted R LE impaired co-ordination and inability to use R UE to stabilize due to impaired grip and decreased strength, pt with R lateral lean requiring maxA to weight shift toward the left   Stairs            Wheelchair Mobility    Modified Rankin (Stroke Patients Only) Modified Rankin (Stroke Patients Only) Pre-Morbid Rankin Score: No symptoms Modified Rankin: Moderately severe disability     Balance Overall balance assessment: Needs assistance Sitting-balance support: Feet unsupported;Single extremity supported Sitting balance-Leahy Scale: Fair Sitting balance - Comments: Pt able to maintain sitting balance EOB without support, but unable to tolerate weigth shift Postural control: Right lateral lean(in standing) Standing balance support: Bilateral upper extremity supported;During functional activity Standing balance-Leahy Scale: Poor Standing balance comment: Pt required max A +2 to maintain standing balance, demonstrated R lateral lean                             Pertinent Vitals/Pain Pain Assessment: No/denies pain Faces Pain Scale: No hurt Pain Intervention(s): Monitored during session;Repositioned    Home Living Family/patient expects to be discharged to:: Private residence Living Arrangements: Spouse/significant other;Children Available Help at Discharge: Family;Available 24 hours/day Type of Home: House Home Access: Stairs to enter   [employer] of Steps: 2 Home Layout: One level Home Equipment: None      Prior Function  Level of Independence: Independent         Comments: Per pt's son, pt was independent for all ADLs and IADLs     Hand Dominance   Dominant Hand: Right     Extremity/Trunk Assessment   Upper Extremity Assessment Upper Extremity Assessment: Defer to OT evaluation RUE Deficits / Details: Pt presenting with limited AROM of RUE, decreased grip strength, and poor coordination. Pt required assist to flex shoulder and elbow. Pt not performing thumb opposition with RUE.  RUE Coordination: decreased fine motor;decreased gross motor    Lower Extremity Assessment Lower Extremity Assessment: RLE deficits/detail RLE Deficits / Details: grossly 4-/5, impaired gross coordination    Cervical / Trunk Assessment Cervical / Trunk Assessment: Normal  Communication   Communication: Prefers language other than English(son used as translater)  Cognition Arousal/Alertness: Awake/alert Behavior During Therapy: Flat affect Overall Cognitive Status: Impaired/Different from baseline Area of Impairment: Attention;Following commands;Safety/judgement;Awareness;Problem solving                   Current Attention Level: Sustained   Following Commands: Follows one step commands inconsistently;Follows one step commands with increased time Safety/Judgement: Decreased awareness of safety;Decreased awareness of deficits Awareness: Intellectual(had episode of urinary incontinence and didn't know) Problem Solving: Slow processing;Decreased initiation;Requires verbal cues;Requires tactile cues;Difficulty sequencing(Requires visual cues/demonstration) General Comments: pt following simple commands 50% of time, pt difficulty sequencing with R UE and LE, pt is aware of R side as pt using L UE to assist in movement      General Comments General comments (skin integrity, edema, etc.): Pt's son present to interpret. Pt with episode of urinatry incontence during ambulation to bathroom, pt dependent for hygiene    Exercises     Assessment/Plan    PT Assessment Patient needs continued PT services  PT Problem List Decreased strength;Decreased range of motion;Decreased  activity tolerance;Decreased balance;Decreased mobility;Decreased coordination;Decreased cognition;Decreased knowledge of use of DME;Decreased safety awareness       PT Treatment Interventions DME instruction;Functional mobility training;Gait training;Stair training;Therapeutic activities;Therapeutic exercise;Balance training;Neuromuscular re-education    PT Goals (Current goals can be found in the Care Plan section)  Acute Rehab PT Goals Patient Stated Goal: not stated PT Goal Formulation: With patient/family Time For Goal Achievement: 11/01/19 Potential to Achieve Goals: Good    Frequency Min 4X/week   Barriers to discharge        Co-evaluation PT/OT/SLP Co-Evaluation/Treatment: Yes Reason for Co-Treatment: Complexity of the patient's impairments (multi-system involvement) PT goals addressed during session: Mobility/safety with mobility OT goals addressed during session: ADL's and self-care       AM-PAC PT "6 Clicks" Mobility  Outcome Measure Help needed turning from your back to your side while in a flat bed without using bedrails?: A Lot Help needed moving from lying on your back to sitting on the side of a flat bed without using bedrails?: A Lot Help needed moving to and from a bed to a chair (including a wheelchair)?: A Lot Help needed standing up from a chair using your arms (e.g., wheelchair or bedside chair)?: A Lot Help needed to walk in hospital room?: A Lot Help needed climbing 3-5 steps with a railing? : Total 6 Click Score: 11    End of Session Equipment Utilized During Treatment: Gait belt Activity Tolerance: Patient tolerated treatment well Patient left: in chair;with call bell/phone within reach;with chair alarm set;with family/visitor present Nurse Communication: Mobility status PT Visit Diagnosis: Unsteadiness on feet (R26.81);Muscle weakness (generalized) (M62.81);Difficulty in walking, not elsewhere classified (R26.2);Other  symptoms and signs involving  the nervous system (R29.898);Hemiplegia and hemiparesis Hemiplegia - Right/Left: Right Hemiplegia - dominant/non-dominant: Dominant Hemiplegia - caused by: Other Nontraumatic intracranial hemorrhage    Time: [id number] PT Time Calculation (min) (ACUTE ONLY): 34 min   Charges:              Lewis ShockAshly Geneen Dieter, PT, DPT Acute Rehabilitation Services Pager #: [phone] Office #: [phone]   Iona Hansenshly M Daniel Johndrow 10/18/2019, 2:21 PM

## 2019-10-18 NOTE — Progress Notes (Signed)
Occupational Therapy Evaluation Patient Details Name: Lindsay Howard MRN: [id number] DOB: 07/14/1976 Today's Date: 10/18/2019    History of Present Illness Lindsay Howard is an 43 y.o. female with a PMHx only of HTN, not on any daily medication. Pt admitted after beign found moaning in bed and not verbally responsive. 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    Clinical Impression   PTA, pt lived at home with husband and children, and was independent for all ADLs and IADLs; information collected via son. Pt currently presents with decreased strength, balance, activity tolerance, ROM, and R hemiplegia. Pt required mod A- max A +2 for all BADLs and functional mobility. Pt required mod VCs and increased time to follow commands, and demonstrated decreased attention and awareness. Pt would benefit from continued OT acutely to address deficits and facilitate safe dc. Due to good family support, recommend dc to CIR to increase safe performance of ADLs and return to prior level of function.     Follow Up Recommendations  CIR;Supervision/Assistance - 24 hour    Equipment Recommendations  Other (comment)(defer to next venue)    Recommendations for Other Services Rehab consult;PT consult     Precautions / Restrictions Precautions Precautions: Fall Restrictions Weight Bearing Restrictions: No      Mobility Bed Mobility Overal bed mobility: Needs Assistance Bed Mobility: Supine to Sit     Supine to sit: Max assist     General bed mobility comments: Pt required max A to elevate trunk into sitting and bring legs off EOB  Transfers Overall transfer level: Needs assistance Equipment used: 2 person hand held assist Transfers: Sit to/from Stand Sit to Stand: Max assist;+2 physical assistance;+2 safety/equipment         General transfer comment: Pt required max A +2 to power up into standing and maintain standing balance    Balance Overall balance assessment:  Needs assistance Sitting-balance support: Feet unsupported;Single extremity supported Sitting balance-Leahy Scale: Fair Sitting balance - Comments: Pt able to maintain sitting balance EOB without support, but unable to tolerate weigth shift Postural control: Right lateral lean(in standing) Standing balance support: Bilateral upper extremity supported;During functional activity Standing balance-Leahy Scale: Poor Standing balance comment: Pt required max A +2 to maintain standing balance, demonstrated R lateral lean                           ADL either performed or assessed with clinical judgement   ADL Overall ADL's : Needs assistance/impaired Eating/Feeding: Minimal assistance;Sitting;Set up   Grooming: Minimal assistance;Sitting   Upper Body Bathing: Moderate assistance;Sitting   Lower Body Bathing: Sit to/from stand;+2 for physical assistance;+2 for safety/equipment;Maximal assistance;Moderate assistance Lower Body Bathing Details (indicate cue type and reason): When cued to wash legs, pt required increased time and mod VCs to initiate task. Pt attempted figure 4 method for washing legs. Pt required mod A to wash feet and back of legs, and max A +2 for peri-care.  Upper Body Dressing : Sitting;Minimal assistance   Lower Body Dressing: Maximal assistance;Sit to/from stand;+2 for physical assistance Lower Body Dressing Details (indicate cue type and reason): Pt required max A to don socks sitting on 3N1, required max A +2 to power up to stand Toilet Transfer: Maximal assistance;+2 for physical assistance;+2 for safety/equipment;Ambulation;BSC Toilet Transfer Details (indicate cue type and reason): When walking into bathroom, pt began to urinate on the floor. BSC placed behind the pt and pt required max  A +2 to transfer up onto raised BSC.  Toileting- Clothing Manipulation and Hygiene: Maximal assistance;+2 for physical assistance Toileting - Clothing Manipulation Details  (indicate cue type and reason): Pt required max A +2 for peri-hygiene     Functional mobility during ADLs: Maximal assistance;+2 for physical assistance;+2 for safety/equipment General ADL Comments: Pt required min A for UB ADLs, mod A-max A +2 for LB ADLs, and max A +2 for functional mobility. Pt demonstrated difficulty moving RLE to walk, required assist to move foot forward     Vision         Perception     Praxis      Pertinent Vitals/Pain Pain Assessment: No/denies pain Faces Pain Scale: No hurt Pain Intervention(s): Monitored during session;Repositioned     Hand Dominance Right   Extremity/Trunk Assessment Upper Extremity Assessment Upper Extremity Assessment: RUE deficits/detail;Generalized weakness RUE Deficits / Details: Pt presenting with limited AROM of RUE, decreased grip strength, and poor coordination. Pt required assist to flex shoulder and elbow. Pt not performing thumb opposition with RUE.  RUE Coordination: decreased fine motor;decreased gross motor   Lower Extremity Assessment Lower Extremity Assessment: Defer to PT evaluation   Cervical / Trunk Assessment Cervical / Trunk Assessment: Normal   Communication Communication Communication: Prefers language other than English   Cognition Arousal/Alertness: Awake/alert Behavior During Therapy: Flat affect Overall Cognitive Status: Impaired/Different from baseline Area of Impairment: Attention;Following commands;Safety/judgement;Awareness;Problem solving                   Current Attention Level: Focused   Following Commands: Follows one step commands inconsistently Safety/Judgement: Decreased awareness of safety;Decreased awareness of deficits Awareness: Intellectual Problem Solving: Slow processing;Decreased initiation;Requires verbal cues;Requires tactile cues(Requires visual cues) General Comments: Pt presented with difficulty following simple commands. Pt required increased time to answer  question "what is your name", and when asked her birth date she did not know. Pt did not communicate when she needed to go to the bathroom during session, and began to urinate on the floor when walking. Pt followed simple command of wash your legs with increased time and visual cues.    General Comments  Pt's son present throughout session, provided interpretation. Pt's BP sitting EOB was 119/84.     Exercises     Shoulder Instructions      Home Living Family/patient expects to be discharged to:: Private residence Living Arrangements: Spouse/significant other;Children Available Help at Discharge: Family;Available 24 hours/day Type of Home: House Home Access: Stairs to enter [employer] of Steps: 2   Home Layout: One level     Bathroom Shower/Tub: Teacher, early years/pre: Standard     Home Equipment: None          Prior Functioning/Environment Level of Independence: Independent        Comments: Per pt's son, pt was independent for all ADLs and IADLs        OT Problem List: Decreased strength;Decreased range of motion;Decreased activity tolerance;Impaired balance (sitting and/or standing);Decreased coordination;Decreased cognition;Decreased safety awareness;Decreased knowledge of use of DME or AE;Decreased knowledge of precautions;Impaired UE functional use;Pain      OT Treatment/Interventions: Self-care/ADL training;DME and/or AE instruction;Therapeutic activities;Cognitive remediation/compensation;Patient/family education;Balance training    OT Goals(Current goals can be found in the care plan section) Acute Rehab OT Goals Patient Stated Goal: not stated OT Goal Formulation: With patient Time For Goal Achievement: 11/01/19 Potential to Achieve Goals: Good  OT Frequency: Min 2X/week   Barriers to D/C:  Co-evaluation PT/OT/SLP Co-Evaluation/Treatment: Yes Reason for Co-Treatment: Complexity of the patient's impairments  (multi-system involvement);For patient/therapist safety;To address functional/ADL transfers PT goals addressed during session: Mobility/safety with mobility OT goals addressed during session: ADL's and self-care      AM-PAC OT "6 Clicks" Daily Activity     Outcome Measure Help from another person eating meals?: None Help from another person taking care of personal grooming?: A Lot Help from another person toileting, which includes using toliet, bedpan, or urinal?: A Lot Help from another person bathing (including washing, rinsing, drying)?: A Lot Help from another person to put on and taking off regular upper body clothing?: A Lot Help from another person to put on and taking off regular lower body clothing?: A Lot 6 Click Score: 14   End of Session Equipment Utilized During Treatment: Gait belt Nurse Communication: Mobility status  Activity Tolerance: Patient tolerated treatment well Patient left: in chair;with call bell/phone within reach;with chair alarm set;with nursing/sitter in room  OT Visit Diagnosis: Unsteadiness on feet (R26.81);Other abnormalities of gait and mobility (R26.89);Muscle weakness (generalized) (M62.81);Ataxia, unspecified (R27.0);Other symptoms and signs involving cognitive function;Cognitive communication deficit (R41.841);Pain;Hemiplegia and hemiparesis Symptoms and signs involving cognitive functions: Cerebral infarction Hemiplegia - Right/Left: Right Hemiplegia - dominant/non-dominant: Dominant Hemiplegia - caused by: Cerebral infarction                Time: 6967-8938 OT Time Calculation (min): 37 min Charges:  OT General Charges $OT Visit: 1 Visit OT Evaluation $OT Eval Moderate Complexity: 1 Mod  Sandrea Hammond, OT Student  Sandrea Hammond 10/18/2019, 12:18 PM

## 2019-10-18 NOTE — Progress Notes (Signed)
SLP Cancellation Note  Patient Details Name: Lindsay Howard MRN: [id number] DOB: 1976/03/07   Cancelled treatment:       Reason Eval/Treat Not Completed: Speech/Language Evaluation order received and acknowledged.  Attempted to use [employer]; however, no interpretor who speaks Santiago Glad was available at this time and operator was unable to determine when an interpretor would be available.  Son was also not present to assist with interpretation; therefore will re-attempt eval tomorrow.    Colin Mulders., M.S., [place] [facility] Office: [phone]  [place] 10/18/2019, 9:20 AM

## 2019-10-18 NOTE — Progress Notes (Signed)
Rehab Admissions Coordinator Note:  Patient was screened by Cleatrice Burke for appropriateness for an Inpatient Acute Rehab Consult per PT recs. .  At this time, we are recommending Inpatient Rehab consult.  Cleatrice Burke 10/18/2019, 3:01 PM  I can be reached at [phone].

## 2019-10-18 NOTE — Progress Notes (Addendum)
  Speech Language Pathology Treatment: Dysphagia  Patient Details Name: Lindsay Howard MRN: [id number] DOB: Oct 06, 1976 Today's Date: 10/18/2019 Time: 3790-2409 SLP Time Calculation (min) (ACUTE ONLY): 20 min  Assessment / Plan / Recommendation Clinical Impression  Pt was encountered awake/alert, lying reclined in bed.  Attempted to use [employer] during this tx session; however, operator stated that a Julieta Bellini was not available at this time and he was unsure of when someone would be available.  Son was also not present at this time to assist with interpretation.  Despite language barrier, pt was able to follow commands to consume trials of thin liquid, nectar-thick liquid, and puree.  Pt exhibited good bolus acceptance and timely AP transport with all trials.  She was able to feed herself independently given set up.  She exhibited min anterior labial spillage from the R side during puree trials; however, suspect that no anterior loss would have occurred if NG tube was not obstructing pt's ability to centralize spoon during trials.  Pt exhibited a delayed throat clear x1 following thin liquid trials; however, no s/sx of aspiration or difficulty were observed with any additional trials.  Plan for MBS later today.  Recommend continuation of Dysphagia 1 (puree) solids and nectar-thick liquid at this time until MBS is completed.     HPI HPI: [name] is a 43 y.o. female with a PMHx only of HTN, not on any daily medication. Only prior surgical history is c-section.  Head CT on 11/5 revealed "Acute intraparenchymal hemorrhage centered at the left thalamus measures 2.4 x 1.7 x 1.3 cm (estimated volume 2.7 cc). Surrounding low-density vasogenic edema without significant regional mass effect. No significant midline shift. No intraventricular extension."      SLP Plan  MBS;Continue with current plan of care       Recommendations  Diet recommendations: Dysphagia 1 (puree);Nectar-thick  liquid Liquids provided via: Straw Medication Administration: Crushed with puree Supervision: Staff to assist with self feeding Compensations: Slow rate;Small sips/bites Postural Changes and/or Swallow Maneuvers: Seated upright 90 degrees                Oral Care Recommendations: Oral care BID Follow up Recommendations: (TBD) SLP Visit Diagnosis: Dysphagia, unspecified (R13.10) Plan: MBS;Continue with current plan of care       Colin Mulders M.S., [place] [facility] Office: [phone]               [place] 10/18/2019, 9:28 AM

## 2019-10-18 NOTE — Progress Notes (Signed)
PHARMACIST - PHYSICIAN COMMUNICATION  DR:   Erlinda Hong  CONCERNING: IV to Oral Route Change Policy  RECOMMENDATION: This patient is receiving pantoprazole by the intravenous route.  Based on criteria approved by the Pharmacy and Therapeutics Committee, the intravenous medication(s) is/are being converted to the equivalent oral dose form(s).   DESCRIPTION: These criteria include:  The patient is eating (either orally or via tube) and/or has been taking other orally administered medications for a least 24 hours  The patient has no evidence of active gastrointestinal bleeding or impaired GI absorption (gastrectomy, short bowel, patient on TNA or NPO).  If you have questions about this conversion, please contact the Pharmacy Department  []   [phone] )  [facility] []   [phone] )  [facility] [x]   [phone] )  [facility] []   [phone] )  [facility] []   [phone] )  [place] 10/18/2019 1:33 PM

## 2019-10-18 NOTE — Progress Notes (Signed)
Modified Barium Swallow Progress Note  Patient Details  Name: Jenie Parish MRN: [id number] Date of Birth: [birth year]  Today's Date: 10/18/2019  Modified Barium Swallow completed.  Full report located under Chart Review in the Imaging Section.  Brief recommendations include the following:  Clinical Impression  Pt presents with mild oropharyngeal dysphagia c/b transient laryngeal penetration with thin liquid secondary to delayed swallow initiation and reduced laryngeal closure.  No aspiration was observed with any trials and no laryngeal penetration was observed with nectar-thick liquid, puree, or regular solids.  Oral phase was remarkable for prolonged mastication of regular solids and decreased lingual strength resulting in trace oral residue during solid trial.  Additionally, pt was observed to have piecemeal deglutition during regular solid trial.  Pharyngeal phase was remarkable for reduced laryngeal closure resulting in laryngeal penetration and delayed swallow initiation at the level of the pyriforms with liquid trials.  Pharyngoesophageal phase was unremarkable.  Recommend diet change to Dysphagia 3 (mech soft) solids and thin liquid with the following precautions: 1) Small bites/sips 2) Slow rate of intake 3) Sit upright 90 degrees.  Pt is at a mild risk for intermittent aspiration with thin liquid; however, use of compensatory strategies should decrease risk.     Of note, attempted to use [employer] Interpretor services during this evaluation; however, operator stated that a Santiago Glad interpretor was not available at this time and pt's son was not present to assist with interpretation.  Despite language barrier, pt followed commands throughout this evaluation without difficultly given visual cues .     Swallow Evaluation Recommendations       SLP Diet Recommendations: Thin liquid;Dysphagia 3 (Mech soft) solids   Liquid Administration via: Cup;Straw   Medication Administration: Whole meds with  puree   Supervision: Staff to assist with self feeding;Intermittent supervision to cue for compensatory strategies   Compensations: Slow rate;Small sips/bites   Postural Changes: Seated upright at 90 degrees   Oral Care Recommendations: Oral care BID        Elvia Collum Chemika Nightengale 10/18/2019,10:39 AM

## 2019-10-19 ENCOUNTER — Encounter (HOSPITAL_COMMUNITY): Payer: Self-pay | Admitting: Physical Medicine and Rehabilitation

## 2019-10-19 DIAGNOSIS — Z789 Other specified health status: Secondary | ICD-10-CM

## 2019-10-19 DIAGNOSIS — I610 Nontraumatic intracerebral hemorrhage in hemisphere, subcortical: Secondary | ICD-10-CM | POA: Diagnosis not present

## 2019-10-19 DIAGNOSIS — Z7409 Other reduced mobility: Secondary | ICD-10-CM

## 2019-10-19 LAB — BASIC METABOLIC PANEL
Anion gap: 9 (ref 5–15)
BUN: 10 mg/dL (ref 6–20)
CO2: 21 mmol/L — ABNORMAL LOW (ref 22–32)
Calcium: 8.6 mg/dL — ABNORMAL LOW (ref 8.9–10.3)
Chloride: 106 mmol/L (ref 98–111)
Creatinine, Ser: 0.8 mg/dL (ref 0.44–1.00)
GFR calc Af Amer: >60 mL/min (ref >60)
GFR calc non Af Amer: >60 mL/min (ref >60)
Glucose, Bld: 89 mg/dL (ref 70–99)
Potassium: 3.7 mmol/L (ref 3.5–5.1)
Sodium: 136 mmol/L (ref 135–145)

## 2019-10-19 LAB — CBC
HCT: 36.5 % (ref 36.0–46.0)
Hemoglobin: 11.7 g/dL — ABNORMAL LOW (ref 12.0–15.0)
MCH: 25.1 pg — ABNORMAL LOW (ref 26.0–34.0)
MCHC: 32.1 g/dL (ref 30.0–36.0)
MCV: 78.3 fL — ABNORMAL LOW (ref 80.0–100.0)
Platelets: 237 10*3/uL (ref 150–400)
RBC: 4.66 MIL/uL (ref 3.87–5.11)
RDW: 14.8 % (ref 11.5–15.5)
WBC: 5.3 10*3/uL (ref 4.0–10.5)
nRBC: 0 % (ref 0.0–0.2)

## 2019-10-19 MED ORDER — ENSURE ENLIVE PO LIQD
237.0000 mL | Freq: Two times a day (BID) | ORAL | Status: DC
  Administered 2019-10-19 – 2019-10-21 (×5): 237 mL via ORAL

## 2019-10-19 NOTE — Progress Notes (Signed)
STROKE TEAM PROGRESS NOTE   INTERVAL HISTORY Patient is lying comfortably in bed.  She is awake but not interactive.  She is aphasic not able to speak or follow commands swallowing well.  Her family is not at bedside this morning, language barrier, cannot use interpreter line.  Blood pressure adequately controlled. She passed swallow eval and is now on a dysphagia 3 diet Vitals:   10/19/19 0900 10/19/19 1000 10/19/19 1100 10/19/19 1200  BP: 100/67 116/81 105/70   Pulse: 67 77 62   Resp: 17  16   Temp:    97.9 F (36.6 C)  TempSrc:    Oral  SpO2: 97% 100% 98%   Weight:      Height:        CBC:  Recent Labs  Lab 10/15/19 1907  10/18/19 0551 10/19/19 0552  WBC 4.9   < > 6.9 5.3  NEUTROABS 2.4  --   --   --   HGB 11.9*   < > 11.9* 11.7*  HCT 37.6   < > 36.8 36.5  MCV 79.7*   < > 78.1* 78.3*  PLT 239   < > 219 237   < > = values in this interval not displayed.    Basic Metabolic Panel:  Recent Labs  Lab 10/18/19 0551 10/19/19 0552  NA 137 136  K 3.8 3.7  CL 104 106  CO2 22 21*  GLUCOSE 90 89  BUN 8 10  CREATININE 0.71 0.80  CALCIUM 8.6* 8.6*   Lipid Panel:     Component Value Date/Time   CHOL 177 10/17/2019 0432   TRIG 156 (H) 10/17/2019 0432   HDL 23 (L) 10/17/2019 0432   CHOLHDL 7.7 10/17/2019 0432   VLDL 31 10/17/2019 0432   LDLCALC 123 (H) 10/17/2019 0432   HgbA1c:  Lab Results  Component Value Date   HGBA1C 5.7 (H) 10/16/2019   Urine Drug Screen:     Component Value Date/Time   LABOPIA NONE DETECTED 10/16/2019 0608   COCAINSCRNUR NONE DETECTED 10/16/2019 0608   LABBENZ NONE DETECTED 10/16/2019 0608   AMPHETMU NONE DETECTED 10/16/2019 0608   THCU NONE DETECTED 10/16/2019 0608   LABBARB NONE DETECTED 10/16/2019 0608    Alcohol Level     Component Value Date/Time   ETH <10 10/15/2019 2122    IMAGING  Ct Angio Head W Or Wo Contrast Ct Angio Neck W And/or Wo Contrast 10/15/2019   CT HEAD  IMPRESSION:  2.7 cc acute intraparenchymal  hemorrhage centered at the left thalamus. Mild localized edema without significant regional mass effect. No midline shift or intraventricular extension.   CTA HEAD AND NECK  IMPRESSION:  1. Normal CTA of the head and neck. No large vessel occlusion or hemodynamically significant stenosis.  2. No vascular abnormality seen underlying the left thalamic hemorrhage.   Dg Abdomen 1 View 10/16/2019 IMPRESSION:  Nasogastric tube tip and side hole in the proximal stomach.  Dg Abdomen 1 View 10/16/2019 IMPRESSION:  No acute abnormality. The previously seen nasogastric tube is no longer visualized.   Ct Head Wo Contrast 10/16/2019 IMPRESSION:  1. No significant interval change in size of left thalamic intraparenchymal hemorrhage, with mildly increased surrounding vasogenic edema with trace localized left-to-right midline shift. No hydrocephalus or ventricular trapping. No intraventricular extension of hemorrhage.  2. No other new acute intracranial abnormality.   Mr Brain Wo Contrast 10/16/2019   ADDENDUM: Omitted impression #4 of generally low marrow signal, please correlate with CBC.  IMPRESSION:  1. Left thalamic hematoma with size stability since earlier CTA. The rim of edema has progressed.  2. No evidence of underlying mass or primary infarct.  3. Minimal white matter disease.   Dg Chest Portable 1 View 10/15/2019 IMPRESSION:  1. No evidence of active cardiopulmonary disease.  2. UPPER limits normal heart size.   Dg Abd Portable 1v 10/16/2019 IMPRESSION:  NG tube tip projects over the gastric body    PHYSICAL EXAM  Temp:  [97.5 F (36.4 C)-99.2 F (37.3 C)] 97.9 F (36.6 C) (11/09 1200) Pulse Rate:  [62-77] 62 (11/09 1100) Resp:  [13-18] 16 (11/09 1100) BP: (95-130)/(67-88) 105/70 (11/09 1100) SpO2:  [97 %-100 %] 98 % (11/09 1100)  General -frail middle-aged Asian lady not in distress.  Lethargic.but eyes open and responsive  Ophthalmologic - fundi not visualized due to  noncooperation.  Cardiovascular - Regular rhythm and rate.  Neuro - lethargic but eyes open, awake, large NG tube in place, severe dysarthria. following commands with visual cues, language barrier, difficult testing language function. Conjugate midline gaze tracks examiner across the room, PERRL, EOMI. Blinking to visual threat bilaterally. Right facial droop. Tongue protrusion not cooperative. Right UE flicker with pain, RLE 2/5 with pain. LUE and LLE spontaneous movement against gravity. DTR 1+ right upgoing toe. Sensation, coordination and gait not tested.  ASSESSMENT/PLAN Lindsay Howard is a 43 y.o. female from [place] with history of HTN found not verbally responsive.   ICH:   L thalamic hemorrhage, likely hypertensive  CT head 2.7cm L thalamic hemorrhage   CTA head & neck Unremarkable   MRI - 10/16/19 - stable L thalamic hemorrhage   Repeat CT head 10/16/19 - No significant interval change - the rim of edema has progressed - generally low marrow signal, please correlate with CBC (low MCV and MCH - Hb normal)  2D Echo EF 60-65%  LDL - 123  HgbA1c 5.7  SCDs for VTE prophylaxis  No antithrombotic prior to admission, now on No antithrombotic given hemorrhage   Therapy recommendations: CLR  Disposition:  pending   Hypertension  Highest BP listed 154/90 in EMR  Home meds:  None listed . SBP goal < 140 . SBP 90s - 120s today  Off cleviprex now, will stop and give prns, consider increasing norvasc or adding oral agent (Family does not know what her home meds were)  On norvasc 5  . Long-term BP goal normotensive  Dysphagia . Secondary to stroke . Speech on board  Other Stroke Risk Factors  Smokeless tobacco user  Other Active Problems Plan transfer patient to neurology floor bed today.  Mobilize out of bed.  Therapy and rehab consults.  No family available at the bedside for discussion patient is aphasic and even using a translator is not going to improve communication  with her. Personally examined patient and images, and have participated in and made any corrections needed to history, physical, neuro exam,assessment and plan as stated above.  I have personally obtained the history, evaluated lab date, reviewed imaging studies and agree with radiology interpretations.  A total of 35 minutes was spent for the care of this patient, spent on counseling patient and family on different diagnostic and therapeutic options, counseling and coordination of care, riskd ans benefits of management, compliance, or risk factor reduction and education.  Antony Contras MD Stroke Neurology       10/19/2019 3:46 PM  To contact Stroke Continuity provider, please refer to [email or web address] After hours, contact General Neurology

## 2019-10-19 NOTE — Progress Notes (Signed)
Initial Nutrition Assessment  DOCUMENTATION CODES:   Not applicable  INTERVENTION:   Ensure Enlive po BID, each supplement provides 350 kcal and 20 grams of protein  Encourage PO intake  NUTRITION DIAGNOSIS:   Inadequate oral intake related to decreased appetite as evidenced by meal completion < 50%.  GOAL:   Patient will meet greater than or equal to 90% of their needs  MONITOR:   PO intake, Supplement acceptance  REASON FOR ASSESSMENT:   Malnutrition Screening Tool    ASSESSMENT:   Pt with PMH of HTN on no medications and chews tobacco admitted with L thalamic acute ICH with extension in to the L midbrain.   11/8 diet advanced, meal completion 35% Pt off cleviprex and has started oral medications.   Medications reviewed and include: senokot Labs reviewed    Diet Order:   Diet Order            DIET DYS 3 Room service appropriate? Yes with Assist; Fluid consistency: Thin  Diet effective now              EDUCATION NEEDS:   No education needs have been identified at this time  Skin:  Skin Assessment: Reviewed RN Assessment  Last BM:  unknown  Height:   Ht Readings from Last 1 Encounters:  10/16/19 4\' 11"  (1.499 m)    Weight:   Wt Readings from Last 1 Encounters:  10/16/19 63.6 kg    Ideal Body Weight:  44.6 kg  BMI:  Body mass index is 28.32 kg/m.  Estimated Nutritional Needs:   Kcal:  1600-1800  Protein:  75-90 grams  Fluid:  > 1.6 L/day  Maylon Peppers RD, LDN, CNSC [phone] Pager [phone] After Hours Pager

## 2019-10-19 NOTE — H&P (Signed)
Physical Medicine and Rehabilitation Admission H&P    Chief Complaint  Patient presents with  . Stroke with functional deficis   HPI:  Lindsay Howard is a 43 year old non-english speaking female from [place] with history of HTN- no medications otherwise in good health who was admitted on 10/15/19 with inability to communicate and moaning incoherently after a nap that evening.  She speaks [name] (also called [name])  UDS negative.  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 Follow up MRI brain showed stable left thalamic hemorrhage with mild progression of rim of edema. 2D echo showed EF of 60-65% with normal diastolic/systolic function and no wall abnormality. Bleed felt to be hypertensive in nature and Norvasc. NGT placed as patient NPO. MBS done 11/8 and she was started on dysphagia 3, thins as mild aspiration risk. She continues to have expressive aphasia with left lean, right sided weakness.  Her son is at bedside today and helps translate. She denies pain, constipation. Son reports that she is having some difficulty understanding him and unclear speech, but he is mostly able to understand her.       Used pacific interpreter.  Review of Systems  Unable to perform ROS: Other  Respiratory: Positive for cough (dry cough noted).   Gastrointestinal: Positive for constipation.  Neurological: Positive for focal weakness (RUE weakness) and weakness (legs feel heavy).   10 point review of systems performed and is negative except as indicated in HPI   Past Medical History:  Diagnosis Date  . Bulky or enlarged uterus     Past Surgical History:  Procedure Laterality Date  . CESAREAN SECTION       Family History  Problem Relation Age of Onset  . Epilepsy Sister        Social History:  Married. Independent PTA. She does not use tobacco or alcohol.    Allergies: No Known Allergies    No medications prior to admission.    Drug Regimen Review  Drug regimen was reviewed and remains appropriate with no significant issues identified  Home: Home Living Family/patient expects to be discharged to:: Private residence Living Arrangements: Spouse/significant other, Children Available Help at Discharge: Family, Available 24 hours/day Type of Home: House Home Access: Stairs to enter Technical brewer of Steps: 2 Home Layout: One level Bathroom Shower/Tub: Chiropodist: Standard Home Equipment: None   Functional History: Prior Function Level of Independence: Independent Comments: Per pt's son, pt was independent for all ADLs and IADLs  Functional Status:  Mobility: Bed Mobility Overal bed mobility: Needs Assistance Bed Mobility: Sidelying to Sit Sidelying to sit: Max assist Supine to sit: Max assist General bed mobility comments: pt in R sidelying, pt initiated pushing self up however due to R UE weakness pt required maxA for trunk elevation Transfers Overall transfer level: Needs assistance Equipment used: 1 person hand held assist Transfers: Sit to/from Stand, Stand Pivot Transfers Sit to Stand: Max assist Stand pivot transfers: Max assist General transfer comment: pt completed std pvt toward the left to [facility], maxA to initiated R LE stepping, tactile cues to straigthen R LE Ambulation/Gait Ambulation/Gait assistance: Max assist, +2 physical assistance, +2 safety/equipment Gait Distance (Feet): 10 Feet(x2, to/from bathroom) Assistive device: 2 person hand held assist Gait  Pattern/deviations: Step-through pattern, Decreased step length - right, Decreased stance time - right, Ataxic, Scissoring General Gait Details: Pt requiring modA for advancement of R LE due to impaired sequencing and ataxic/scissoring like movement. Pt  with noted R LE impaired co-ordination and inability to use R UE to stabilize due to impaired grip and decreased strength, pt with R lateral lean requiring maxA to weight shift toward the left  Gait velocity: slow    ADL: ADL Overall ADL's : Needs assistance/impaired Eating/Feeding: Minimal assistance, Sitting, Set up Grooming: Minimal assistance, Sitting Upper Body Bathing: Moderate assistance, Sitting Lower Body Bathing: Sit to/from stand, +2 for physical assistance, +2 for safety/equipment, Maximal assistance, Moderate assistance Lower Body Bathing Details (indicate cue type and reason): When cued to wash legs, pt required increased time and mod VCs to initiate task. Pt attempted figure 4 method for washing legs. Pt required mod A to wash feet and back of legs, and max A +2 for peri-care.  Upper Body Dressing : Sitting, Minimal assistance Lower Body Dressing: Maximal assistance, Sit to/from stand, +2 for physical assistance Lower Body Dressing Details (indicate cue type and reason): Pt required max A to don socks sitting on 3N1, required max A +2 to power up to stand Toilet Transfer: Maximal assistance, +2 for physical assistance, +2 for safety/equipment, Ambulation, BSC Toilet Transfer Details (indicate cue type and reason): When walking into bathroom, pt began to urinate on the floor. BSC placed behind the pt and pt required max A +2 to transfer up onto raised BSC.  Toileting- Clothing Manipulation and Hygiene: Maximal assistance, +2 for physical assistance Toileting - Clothing Manipulation Details (indicate cue type and reason): Pt required max A +2 for peri-hygiene Functional mobility during ADLs: Maximal assistance, +2 for physical assistance, +2 for safety/equipment General ADL Comments: Pt required min A for UB ADLs, mod A-max A +2 for LB ADLs, and max A +2 for functional mobility. Pt demonstrated difficulty moving RLE to walk, required assist to move foot forward  Cognition:  Cognition Overall Cognitive Status: Impaired/Different from baseline Orientation Level: Oriented to person Cognition Arousal/Alertness: Awake/alert Behavior During Therapy: Flat affect Overall Cognitive Status: Impaired/Different from baseline Area of Impairment: Following commands, Problem solving Current Attention Level: Sustained Following Commands: Follows one step commands with increased time(must do demonstration due to global aphasia) Safety/Judgement: Decreased awareness of safety, Decreased awareness of deficits Awareness: Intellectual(had episode of urinary incontinence and didn't know) Problem Solving: Slow processing, Decreased initiation, Difficulty sequencing, Requires verbal cues, Requires tactile cues General Comments: pt with impaired receptive and expressive aphasia but followed simple visual cues accurately Difficult to assess due to: Non-English speaking  Physical Exam: Blood pressure 116/81, pulse 77, temperature (!) 97.5 F (36.4 C), temperature source Oral, resp. rate 17, height  (1.499 m), weight 63.6 kg, SpO2 100 %. Physical Exam  Nursing note and vitals reviewed. Constitutional: She appears well-developed and well-nourished.  Neurological: She is alert.  Soft voice -- occassionally able to speak louder. Oriented to self and knew that it was the 11th month, thought that she was in a hospital or clinic with cues. Unable to state age, DOB or country. She was able to follow simple motor commands. RUE weakness noted.   Eyes open and awake. Severely dysarthric with right facial droop; CN otherwise intact. Follows commands. RUE 0/5. All other extremities antigravity, testing limited due to language barrier. General: No apparent distress HEENT:Head is normocephalic, atraumatic, PERRLA, EOMI, sclera anicteric, oral mucosa pink and moist, dentition intact, ext ear  canals clear,  Neck:Supple without JVD or lymphadenopathy Heart:Reg rate and rhythm. No murmurs  rubs or gallops Chest:CTA bilaterally without wheezes, rales, or rhonchi; no distress Abdomen:Soft, non-tender, non-distended, bowel sounds positive. Extremities:No clubbing, cyanosis, or edema. Pulses are 2+ Skin:Clean and intact without signs of breakdown Psych:Dull, flat.Pt is cooperative  Results for orders placed or performed during the hospital encounter of 10/15/19 (from the past 48 hour(s))  CBC     Status: Abnormal   Collection Time: 10/18/19  5:51 AM  Result Value Ref Range   WBC 6.9 4.0 - 10.5 K/uL   RBC 4.71 3.87 - 5.11 MIL/uL   Hemoglobin 11.9 (L) 12.0 - 15.0 g/dL   HCT 65.736.8 84.636.0 - 96.246.0 %   MCV 78.1 (L) 80.0 - 100.0 fL   MCH 25.3 (L) 26.0 - 34.0 pg   MCHC 32.3 30.0 - 36.0 g/dL   RDW 95.214.6 84.111.5 - 32.415.5 %   Platelets 219 150 - 400 K/uL   nRBC 0.0 0.0 - 0.2 %    Comment: Performed at [facility], [address] [place], [place] [address]  Basic metabolic panel     Status: Abnormal   Collection Time: 10/18/19  5:51 AM  Result Value Ref Range   Sodium 137 135 - 145 mmol/L   Potassium 3.8 3.5 - 5.1 mmol/L   Chloride 104 98 - 111 mmol/L   CO2 22 22 - 32 mmol/L   Glucose, Bld 90 70 - 99 mg/dL   BUN 8 6 - 20 mg/dL   Creatinine, Ser 7.250.71 0.44 - 1.00 mg/dL   Calcium 8.6 (L) 8.9 - 10.3 mg/dL   GFR calc non Af Amer >60 >60 mL/min   GFR calc Af Amer >60 >60 mL/min   Anion gap 11 5 - 15    Comment: Performed at [facility], [address] [place], [place] [address]  CBC     Status: Abnormal   Collection Time: 10/19/19  5:52 AM  Result Value Ref Range   WBC 5.3 4.0 - 10.5 K/uL   RBC 4.66 3.87 - 5.11 MIL/uL   Hemoglobin 11.7 (L) 12.0 - 15.0 g/dL   HCT 03.436.5 74.236.0 - 59.546.0 %   MCV 78.3 (L) 80.0 - 100.0 fL   MCH 25.1 (L) 26.0 - 34.0 pg   MCHC 32.1 30.0 - 36.0 g/dL   RDW 63.814.8 75.611.5 - 43.315.5 %   Platelets 237 150 - 400 K/uL   nRBC 0.0 0.0 - 0.2 %    Comment: Performed at [facility], [address] [place], [place] [address]  Basic metabolic panel      Status: Abnormal   Collection Time: 10/19/19  5:52 AM  Result Value Ref Range   Sodium 136 135 - 145 mmol/L   Potassium 3.7 3.5 - 5.1 mmol/L   Chloride 106 98 - 111 mmol/L   CO2 21 (L) 22 - 32 mmol/L   Glucose, Bld 89 70 - 99 mg/dL   BUN 10 6 - 20 mg/dL   Creatinine, Ser 8.410.80 0.44 - 1.00 mg/dL   Calcium 8.6 (L) 8.9 - 10.3 mg/dL   GFR calc non Af Amer >60 >60 mL/min   GFR calc Af Amer >60 >60 mL/min   Anion gap 9 5 - 15    Comment: Performed at [facility], [address] [place], [place] [address]   Dg Abd 1 View  Result Date: 10/17/2019 CLINICAL DATA:  NG tube placement EXAM: ABDOMEN -  1 VIEW COMPARISON:  Abdominal radiograph 10/11/2019 FINDINGS: Enteric tube tip and side-port project over the stomach. Monitoring leads overlie the patient. Gas is demonstrated within nondilated loops of bowel within the abdomen. IMPRESSION: Enteric tube tip and side-port project over the stomach. Electronically Signed   By: Annia Belt M.D.   On: 10/17/2019 13:21   Dg Swallowing Func-speech Pathology  Result Date: 10/18/2019 Objective Swallowing Evaluation: Type of Study: MBS-Modified Barium Swallow Study  Patient Details Name: Lindsay Howard MRN: [id number] Date of Birth: 07-13-1976 Today's Date: 10/18/2019 Time: SLP Start Time (ACUTE ONLY): 0950 -SLP Stop Time (ACUTE ONLY): 1015 SLP Time Calculation (min) (ACUTE ONLY): 25 min Past Medical History: No past medical history on file. Past Surgical History: The histories are not reviewed yet. Please review them in the "History" navigator section and refresh this SmartLink. HPI: Christia Domke is a 43 y.o. female with a PMHx only of HTN, not on any daily medication. Only prior surgical history is c-section.  Head CT on 11/5 revealed "Acute intraparenchymal hemorrhage centered at the left thalamus measures 2.4 x 1.7 x 1.3 cm (estimated volume 2.7 cc). Surrounding low-density vasogenic edema without significant regional mass effect. No significant midline shift. No  intraventricular extension."  Subjective: Pt was alert and cooperative Assessment / Plan / Recommendation CHL IP CLINICAL IMPRESSIONS 10/18/2019 Clinical Impression Pt presents with mild oropharyngeal dysphagia c/b transient laryngeal penetration with thin liquid secondary to delayed swallow initiation and reduced laryngeal closure.  No aspiration was observed with any trials and no laryngeal penetration was observed with nectar-thick liquid, puree, or regular solids.  Oral phase was remarkable for prolonged mastication of regular solids and decreased lingual strength resulting in trace oral residue during solid trial.  Additionally, pt was observed to have piecemeal deglutition during regular solid trial.  Pharyngeal phase was remarkable for reduced laryngeal closure resulting in laryngeal penetration and delayed swallow initiation at the level of the pyriforms with all liquid trials.  Pharyngoesophageal phase was unremarkable.  Recommend diet change to Dysphagia 3 (mech soft) solids and thin liquid with the following precautions: 1) Small bites/sips 2) Slow rate of intake 3) Sit upright 90 degrees.  Pt is at a mild risk for intermittent aspiration with thin liquid; however, use of compensatory strategies should decrease risk.  SLP Visit Diagnosis Dysphagia, oropharyngeal phase (R13.12) Attention and concentration deficit following -- Frontal lobe and executive function deficit following -- Impact on safety and function Mild aspiration risk   CHL IP TREATMENT RECOMMENDATION 10/18/2019 Treatment Recommendations Therapy as outlined in treatment plan below   Prognosis 10/18/2019 Prognosis for Safe Diet Advancement Good Barriers to Reach Goals -- Barriers/Prognosis Comment -- CHL IP DIET RECOMMENDATION 10/18/2019 SLP Diet Recommendations Thin liquid;Dysphagia 3 (Mech soft) solids Liquid Administration via Cup;Straw Medication Administration Whole meds with puree Compensations Slow rate;Small sips/bites Postural Changes  Seated upright at 90 degrees   CHL IP OTHER RECOMMENDATIONS 10/18/2019 Recommended Consults -- Oral Care Recommendations Oral care BID Other Recommendations --   CHL IP FOLLOW UP RECOMMENDATIONS 10/18/2019 Follow up Recommendations (No Data)   CHL IP FREQUENCY AND DURATION 10/18/2019 Speech Therapy Frequency (ACUTE ONLY) min 1 x/week Treatment Duration 2 weeks      CHL IP ORAL PHASE 10/18/2019 Oral Phase Impaired Oral - Pudding Teaspoon -- Oral - Pudding Cup -- Oral - Honey Teaspoon -- Oral - Honey Cup -- Oral - Nectar Teaspoon -- Oral - Nectar Cup -- Oral - Nectar Straw WFL Oral - Thin Teaspoon WFL Oral - Thin Cup  WFL Oral - Thin Straw WFL Oral - Puree WFL Oral - Mech Soft -- Oral - Regular Delayed oral transit;Piecemeal swallowing Oral - Multi-Consistency -- Oral - Pill -- Oral Phase - Comment --  CHL IP PHARYNGEAL PHASE 10/18/2019 Pharyngeal Phase Impaired Pharyngeal- Pudding Teaspoon -- Pharyngeal -- Pharyngeal- Pudding Cup -- Pharyngeal -- Pharyngeal- Honey Teaspoon -- Pharyngeal -- Pharyngeal- Honey Cup -- Pharyngeal -- Pharyngeal- Nectar Teaspoon -- Pharyngeal -- Pharyngeal- Nectar Cup -- Pharyngeal -- Pharyngeal- Nectar Straw Delayed swallow initiation-pyriform sinuses Pharyngeal Material does not enter airway Pharyngeal- Thin Teaspoon Delayed swallow initiation-pyriform sinuses Pharyngeal Material does not enter airway Pharyngeal- Thin Cup Delayed swallow initiation-pyriform sinuses;Reduced airway/laryngeal closure;Penetration/Aspiration before swallow Pharyngeal Material enters airway, remains ABOVE vocal cords then ejected out Pharyngeal- Thin Straw Delayed swallow initiation-pyriform sinuses;Reduced airway/laryngeal closure;Penetration/Aspiration before swallow Pharyngeal Material enters airway, remains ABOVE vocal cords then ejected out Pharyngeal- Puree Delayed swallow initiation-vallecula Pharyngeal Material does not enter airway Pharyngeal- Mechanical Soft -- Pharyngeal -- Pharyngeal- Regular Delayed  swallow initiation-vallecula Pharyngeal Material does not enter airway Pharyngeal- Multi-consistency -- Pharyngeal -- Pharyngeal- Pill -- Pharyngeal -- Pharyngeal Comment --  CHL IP CERVICAL ESOPHAGEAL PHASE 10/18/2019 Cervical Esophageal Phase WFL Pudding Teaspoon -- Pudding Cup -- Honey Teaspoon -- Honey Cup -- Nectar Teaspoon -- Nectar Cup -- Nectar Straw -- Thin Teaspoon -- Thin Cup -- Thin Straw -- Puree -- Mechanical Soft -- Regular -- Multi-consistency -- Pill -- Cervical Esophageal Comment -- Villa Herb M.S., CCC-SLP Acute Rehabilitation Services Office: [phone] Shanon Rosser [facility] 10/18/2019, 10:45 AM               Medical Problem List and Plan: 1.  Impaired mobility and ADLs secondary to left thalamic hemorrhage  Impairments include expressive aphasia, R sided hemiplegia worse in the upper extremity, R LE ataxia, impaired coordination, impaired motor planning, currently ambulating only with MaxAx2.  2.  Antithrombotics: -DVT/anticoagulation:  Pharmaceutical: Lovenox  -antiplatelet therapy: N/A 3. Pain Management: N/A 4. Mood: LCSW to follow for evaluation and support as able   -antipsychotic agents: N/a 5. Neuropsych: This patient is not capable of making decisions on her own behalf. 6. Skin/Wound Care: Routine pressure relief measures.  7. Fluids/Electrolytes/Nutrition: Intake poor--will ask family to provide meals as able.  8. Hypertension: Patient will benefit from hypertension management education as she was not on any medication prior to this admission. Continue Norvasc 5mg  QD. Goal SBP <140. 9. Constipation: On Senna-Docusate BID. Encourage eating of meals.  10. Disposition: Will require follow-up with PCP and Physiatry upon discharge.  11. Education: Will require education regarding lowering her risk of HTN and stroke.   , PA-C 10/19/2019   I have personally performed a face to face diagnostic evaluation, including, but not limited to relevant history and physical  exam findings, of this patient and developed relevant assessment and plan.  Additionally, I have reviewed and concur with the physician assistant's documentation above.  13/08/2019, MD

## 2019-10-19 NOTE — Evaluation (Signed)
Speech Language Pathology Evaluation Patient Details Name: Lindsay Howard MRN: [id number] DOB: 1976-09-08 Today's Date: 10/19/2019 Time: 1446-1500 SLP Time Calculation (min) (ACUTE ONLY): 14 min  Problem List:  Patient Active Problem List   Diagnosis Date Noted  . Intracerebral hemorrhage 10/15/2019  . ICH (intracerebral hemorrhage) ([place]) 10/15/2019   Past Medical History:  Past Medical History:  Diagnosis Date  . Bulky or enlarged uterus    Past Surgical History:  Past Surgical History:  Procedure Laterality Date  . CESAREAN SECTION     HPI:  Lindsay Howard is a 43 y.o. female with a PMHx only of HTN, not on any daily medication. Only prior surgical history is c-section.  Head CT on 11/5 revealed "Acute intraparenchymal hemorrhage centered at the left thalamus measures 2.4 x 1.7 x 1.3 cm (estimated volume 2.7 cc). Surrounding low-density vasogenic edema without significant regional mass effect. No significant midline shift. No intraventricular extension."   Assessment / Plan / Recommendation Clinical Impression  Speech-language-cognitive assessment completed via audio interpreter [name] not available on video). She exhibits mild dysarthria as intensity was low and interpreter requested repetition intermittently throughout evaluation. She followed one step commands with 100% accuracy. Length of verbalizations appeared to be at the phrase and short sentence level. Interpreter stated she was using appropriate language to answer basic questions. Cognitive impairments present in the areas of awareness, orientation to place and situation and suspect problem solving. Will continue ST to facilitate cognitive and speech independence         SLP Assessment  SLP Recommendation/Assessment: Patient needs continued Speech Lanaguage Pathology Services SLP Visit Diagnosis: Dysarthria and anarthria (R47.1);Cognitive communication deficit (R41.841)    Follow Up Recommendations  Inpatient Rehab    Frequency and  Duration min 2x/week  2 weeks      SLP Evaluation Cognition  Overall Cognitive Status: No family/caregiver present to determine baseline cognitive functioning Arousal/Alertness: Awake/alert Orientation Level: Oriented to person;Disoriented to place;Disoriented to situation Attention: Sustained Sustained Attention: Appears intact Memory: (TBA) Awareness: Impaired Awareness Impairment: Intellectual impairment;Emergent impairment;Anticipatory impairment Problem Solving: (TBA) Safety/Judgment: Impaired       Comprehension  Auditory Comprehension Overall Auditory Comprehension: Appears within functional limits for tasks assessed Yes/No Questions: Not tested Commands: Within Functional Limits(simple one step) Visual Recognition/Discrimination Discrimination: Not tested Reading Comprehension Reading Status: Not tested    Expression Expression Primary Mode of Expression: Verbal Verbal Expression Overall Verbal Expression: Appears within functional limits for tasks assessed Initiation: No impairment Level of Generative/Spontaneous Verbalization: Phrase Repetition: (NT) Naming: (unable to assess-auditory interpretation only) Pragmatics: No impairment Written Expression Written Expression: Not tested   Oral / Motor  Oral Motor/Sensory Function Overall Oral Motor/Sensory Function: Mild impairment Facial ROM: Reduced right Facial Symmetry: Abnormal symmetry right Facial Sensation: Within Functional Limits Lingual ROM: Reduced right;Reduced left Motor Speech Overall Motor Speech: Impaired Respiration: Within functional limits Phonation: Low vocal intensity Resonance: Within functional limits Articulation: Within functional limitis Intelligibility: Intelligibility reduced Word: 75-100% accurate Phrase: 50-74% accurate Motor Planning: Witnin functional limits   GO                    Houston Siren 10/19/2019, 5:51 PM   Orbie Pyo Joylene Wescott M.Ed  Risk analyst [phone] Office [phone]

## 2019-10-19 NOTE — Progress Notes (Signed)
Pt transferred to [address]. Report given. Son at bedside.

## 2019-10-19 NOTE — Progress Notes (Signed)
Inpatient Rehab Admissions:  Inpatient Rehab Consult received.  I met with patient and her son at the bedside for rehabilitation assessment and to discuss goals and expectations of an inpatient rehab admission.  Pt tracks this AC, but does not verbalize.  Son at bedside (per ED note on 11/5 he is 12?) and able to translate some, but does not appear to understand fully what we discussed.  I called the emergency contact in pt's chart [facility] [phone]) and reached pt's niece, who does speak English reasonably well.  She reports that pt will have 24/7 assist from family at discharge and they are hopeful for inpatient rehab if insurance will authorize.  Will open for possible admission pending approval, and bed availability.   Signed: Shann Medal, PT, DPT Admissions Coordinator [phone] 10/19/19  4:05 PM

## 2019-10-19 NOTE — Progress Notes (Signed)
  Speech Language Pathology Treatment: Dysphagia  Patient Details Name: Lindsay Howard MRN: [id number] DOB: 1976-10-10 Today's Date: 10/19/2019 Time: 4128-7867 SLP Time Calculation (min) (ACUTE ONLY): 9 min  Assessment / Plan / Recommendation Clinical Impression  Pt positioned upright in bed for swallowing tx with Lindsay Howard interpreter services utilized during majority of session. Technical difficulties occurred near end of session resulting in inability to convey treatment plan. Pt observed with thin liquids via cup and straw and regular consistencies. Pt took small, single sips with a slow rate independently. No overt s/sx of oral impairments or aspiration during session. Pt able to self-feed with prep from SLP. Sign placed at head of bed for current diet recommendation D3/thin, meds crushed in puree. SLP will f/u for diet toleration and to provide education on aspiration precautions and goals of care.    HPI HPI: Lindsay Howard is a 43 y.o. female with a PMHx only of HTN, not on any daily medication. Only prior surgical history is c-section.  Head CT on 11/5 revealed "Acute intraparenchymal hemorrhage centered at the left thalamus measures 2.4 x 1.7 x 1.3 cm (estimated volume 2.7 cc). Surrounding low-density vasogenic edema without significant regional mass effect. No significant midline shift. No intraventricular extension."      SLP Plan  Continue with current plan of care       Recommendations  Diet recommendations: Dysphagia 3 (mechanical soft);Thin liquid Liquids provided via: Cup;Straw Medication Administration: Whole meds with puree Supervision: Staff to assist with self feeding Postural Changes and/or Swallow Maneuvers: Seated upright 90 degrees                Oral Care Recommendations: Oral care BID;Staff/trained caregiver to provide oral care Follow up Recommendations: Inpatient Rehab SLP Visit Diagnosis: Dysphagia, oropharyngeal phase (R13.12) Plan: Continue with current plan of  care       GO                Lindsay Howard 10/19/2019, 3:31 PM

## 2019-10-19 NOTE — Progress Notes (Signed)
Physical Therapy Treatment Patient Details Name: Lindsay Howard MRN: [id number] DOB: 05/24/1976 Today's Date: 10/19/2019    History of Present Illness Lindsay Howard is an 43 y.o. female with a PMHx only of HTN, not on any daily medication. Pt admitted after beign found moaning in bed and not verbally responsive. 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     PT Comments    Pt con't to be aphasic however follows visual demonstration consistently. Pt with unintelligible speech and doesn't understand the interpreter in her native language due to receptive aphasia. Pt with noted R LE weakness and impaired coordination requiring assist for R LE advancement during ambulation. Pt to continue to benefit from CIR upon d/c for maximal functional recovery. Acute PT to cont to follow.   Follow Up Recommendations  CIR     Equipment Recommendations       Recommendations for Other Services Rehab consult     Precautions / Restrictions Precautions Precautions: Fall Precaution Comments: R hemiparesis Restrictions Weight Bearing Restrictions: No    Mobility  Bed Mobility Overal bed mobility: Needs Assistance Bed Mobility: Sidelying to Sit   Sidelying to sit: Max assist       General bed mobility comments: pt in R sidelying, pt initiated pushing self up however due to R UE weakness pt required maxA for trunk elevation  Transfers Overall transfer level: Needs assistance Equipment used: 1 person hand held assist Transfers: Sit to/from [employer] Transfers Sit to Stand: Max assist Stand pivot transfers: Max assist       General transfer comment: pt completed std pvt toward the left to [facility], maxA to initiated R LE stepping, tactile cues to straigthen R LE  Ambulation/Gait Ambulation/Gait assistance: Max assist;+2 safety/equipment Gait Distance (Feet): 40 Feet Assistive device: 2 person hand held assist Gait Pattern/deviations: Step-through pattern;Decreased  step length - right;Decreased stance time - right;Ataxic;Scissoring Gait velocity: slow Gait velocity interpretation: 1.31 - 2.62 ft/sec, indicative of limited community ambulator General Gait Details: pt with R lateral lean requiring tactile cues for weight shift to the L with R LE advancement, pt initially required maxA for R LE advancement however then began moving on own but required guidance for placement as R LE would adduct, tactile cues to R thigh to achieve knee extension during stance phase   Stairs             Wheelchair Mobility    Modified Rankin (Stroke Patients Only) Modified Rankin (Stroke Patients Only) Pre-Morbid Rankin Score: No symptoms Modified Rankin: Moderately severe disability     Balance Overall balance assessment: Needs assistance Sitting-balance support: Feet unsupported;Single extremity supported Sitting balance-Leahy Scale: Fair Sitting balance - Comments: Pt able to maintain sitting balance EOB without support, but unable to tolerate weigth shift Postural control: Right lateral lean Standing balance support: Bilateral upper extremity supported;During functional activity Standing balance-Leahy Scale: Poor Standing balance comment: Pt required max A +2 to maintain standing balance, demonstrated R lateral lean                            Cognition Arousal/Alertness: Awake/alert Behavior During Therapy: Flat affect Overall Cognitive Status: Impaired/Different from baseline Area of Impairment: Following commands;Problem solving                       Following Commands: Follows one step commands with increased time(must do demonstration due to global aphasia)  Problem Solving: Slow processing;Decreased initiation;Difficulty sequencing;Requires verbal cues;Requires tactile cues General Comments: pt with impaired receptive and expressive aphasia but followed simple visual cues accurately      Exercises      General  Comments General comments (skin integrity, edema, etc.): Pt very responsive to demonstration. Pt assisted to [facility] and urinated, dependent for hygiene      Pertinent Vitals/Pain Pain Assessment: No/denies pain    Home Living                      Prior Function            PT Goals (current goals can now be found in the care plan section) Acute Rehab PT Goals Patient Stated Goal: not stated Progress towards PT goals: Progressing toward goals    Frequency    Min 4X/week      PT Plan Current plan remains appropriate    Co-evaluation              AM-PAC PT "6 Clicks" Mobility   Outcome Measure  Help needed turning from your back to your side while in a flat bed without using bedrails?: A Lot Help needed moving from lying on your back to sitting on the side of a flat bed without using bedrails?: A Lot Help needed moving to and from a bed to a chair (including a wheelchair)?: A Lot Help needed standing up from a chair using your arms (e.g., wheelchair or bedside chair)?: A Lot Help needed to walk in hospital room?: A Lot Help needed climbing 3-5 steps with a railing? : Total 6 Click Score: 11    End of Session Equipment Utilized During Treatment: Gait belt Activity Tolerance: Patient tolerated treatment well Patient left: in chair;with call bell/phone within reach;with chair alarm set;with family/visitor present Nurse Communication: Mobility status PT Visit Diagnosis: Unsteadiness on feet (R26.81);Muscle weakness (generalized) (M62.81);Difficulty in walking, not elsewhere classified (R26.2);Other symptoms and signs involving the nervous system (R29.898);Hemiplegia and hemiparesis Hemiplegia - Right/Left: Right Hemiplegia - dominant/non-dominant: Dominant Hemiplegia - caused by: Other Nontraumatic intracranial hemorrhage     Time: 0951-1022 PT Time Calculation (min) (ACUTE ONLY): 31 min  Charges:  $Gait Training: 8-22 mins $Therapeutic Activity: 8-22  mins                     Kittie Plater, PT, DPT Acute Rehabilitation Services Pager #: [phone] Office #: [phone]    [name] 10/19/2019, 12:47 PM

## 2019-10-20 ENCOUNTER — Encounter (HOSPITAL_COMMUNITY): Payer: Self-pay | Admitting: *Deleted

## 2019-10-20 DIAGNOSIS — I10 Essential (primary) hypertension: Secondary | ICD-10-CM | POA: Diagnosis not present

## 2019-10-20 DIAGNOSIS — R2689 Other abnormalities of gait and mobility: Secondary | ICD-10-CM | POA: Diagnosis not present

## 2019-10-20 DIAGNOSIS — I610 Nontraumatic intracerebral hemorrhage in hemisphere, subcortical: Secondary | ICD-10-CM

## 2019-10-20 LAB — CBC
HCT: 37.3 % (ref 36.0–46.0)
Hemoglobin: 12.2 g/dL (ref 12.0–15.0)
MCH: 25.6 pg — ABNORMAL LOW (ref 26.0–34.0)
MCHC: 32.7 g/dL (ref 30.0–36.0)
MCV: 78.2 fL — ABNORMAL LOW (ref 80.0–100.0)
Platelets: 236 10*3/uL (ref 150–400)
RBC: 4.77 MIL/uL (ref 3.87–5.11)
RDW: 14.7 % (ref 11.5–15.5)
WBC: 5.1 10*3/uL (ref 4.0–10.5)
nRBC: 0 % (ref 0.0–0.2)

## 2019-10-20 LAB — BASIC METABOLIC PANEL
Anion gap: 10 (ref 5–15)
BUN: 11 mg/dL (ref 6–20)
CO2: 20 mmol/L — ABNORMAL LOW (ref 22–32)
Calcium: 8.9 mg/dL (ref 8.9–10.3)
Chloride: 106 mmol/L (ref 98–111)
Creatinine, Ser: 0.65 mg/dL (ref 0.44–1.00)
GFR calc Af Amer: >60 mL/min (ref >60)
GFR calc non Af Amer: >60 mL/min (ref >60)
Glucose, Bld: 97 mg/dL (ref 70–99)
Potassium: 3.6 mmol/L (ref 3.5–5.1)
Sodium: 136 mmol/L (ref 135–145)

## 2019-10-20 NOTE — Progress Notes (Signed)
Physical Therapy Treatment Patient Details Name: Lindsay Howard MRN: [id number] DOB: 10-May-1976 Today's Date: 10/20/2019    History of Present Illness Lindsay Howard is an 43 y.o. female with a PMHx only of HTN, not on any daily medication. Pt admitted after beign found moaning in bed and not verbally responsive. 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     PT Comments    Pt performed gt training and functional mobility.  She performed tx following gestures with good carryover.  PTA introduced youth height RW and patient more stable with this device.  She required guarding on her L side to counter balance her lateral lean.  Pt continues to require mod to max assistance and would make an excellent candidate for aggressive CIR therapies.      Follow Up Recommendations  CIR     Equipment Recommendations  (TBD at next venue)    Recommendations for Other Services Rehab consult     Precautions / Restrictions Precautions Precautions: Fall Precaution Comments: R hemiparesis Restrictions Weight Bearing Restrictions: No    Mobility  Bed Mobility Overal bed mobility: Needs Assistance Bed Mobility: Rolling Rolling: Mod assist Sidelying to sit: Mod assist       General bed mobility comments: Pt required assistance to move to edge of bed for LE advancement and trunk elevation,  Pt with anterior lean intially but once feet planted on floor able to hold balance edge of bed unassisted.  Transfers Overall transfer level: Needs assistance Equipment used: Right platform walker Transfers: Sit to/from Stand;Stand Pivot Transfers Sit to Stand: Min assist Stand pivot transfers: Mod assist;+2 safety/equipment       General transfer comment: Pt performed multiple sit to stand trials and stand pivot from bed to bedside commode.  Ambulation/Gait Ambulation/Gait assistance: Max assist;+2 safety/equipment Gait Distance (Feet): 50 Feet Assistive device: Right platform  walker(youth height R platform RW.) Gait Pattern/deviations: Step-through pattern;Decreased step length - right;Decreased stance time - right;Ataxic;Scissoring Gait velocity: slow   General Gait Details: pt with R lateral lean requiring tactile cues for weight shift to the L with R LE advancement, Pt able to advance RLE forward unassisted but required max assistance to maintain path of RW and to manuever RW.   Stairs             Wheelchair Mobility    Modified Rankin (Stroke Patients Only)       Balance Overall balance assessment: Needs assistance Sitting-balance support: Feet unsupported;Single extremity supported Sitting balance-Leahy Scale: Fair Sitting balance - Comments: Pt able to maintain sitting balance EOB without support, but unable to tolerate weigth shift Postural control: Right lateral lean Standing balance support: Bilateral upper extremity supported;During functional activity Standing balance-Leahy Scale: Poor Standing balance comment: Pt required max A +2 to maintain standing balance, demonstrated R lateral lean                            Cognition Arousal/Alertness: Awake/alert Behavior During Therapy: Flat affect Overall Cognitive Status: No family/caregiver present to determine baseline cognitive functioning Area of Impairment: Following commands;Problem solving                   Current Attention Level: Sustained   Following Commands: Follows one step commands with increased time(only follows demonstration due to receptive aphasia) Safety/Judgement: Decreased awareness of safety;Decreased awareness of deficits Awareness: Intellectual Problem Solving: Slow processing;Decreased initiation;Difficulty sequencing;Requires verbal cues;Requires tactile cues General Comments:  pt with impaired receptive and expressive aphasia but followed simple visual cues accurately      Exercises      General Comments        Pertinent  Vitals/Pain Pain Assessment: Faces Faces Pain Scale: No hurt    Home Living                      Prior Function            PT Goals (current goals can now be found in the care plan section) Acute Rehab PT Goals Patient Stated Goal: not stated Potential to Achieve Goals: Good Progress towards PT goals: Progressing toward goals    Frequency    Min 4X/week      PT Plan Current plan remains appropriate    Co-evaluation              AM-PAC PT "6 Clicks" Mobility   Outcome Measure  Help needed turning from your back to your side while in a flat bed without using bedrails?: A Lot Help needed moving from lying on your back to sitting on the side of a flat bed without using bedrails?: A Lot Help needed moving to and from a bed to a chair (including a wheelchair)?: A Lot Help needed standing up from a chair using your arms (e.g., wheelchair or bedside chair)?: A Lot Help needed to walk in hospital room?: A Lot Help needed climbing 3-5 steps with a railing? : Total 6 Click Score: 11    End of Session Equipment Utilized During Treatment: Gait belt Activity Tolerance: Patient tolerated treatment well Patient left: in chair;with call bell/phone within reach;with chair alarm set;with family/visitor present Nurse Communication: Mobility status PT Visit Diagnosis: Unsteadiness on feet (R26.81);Muscle weakness (generalized) (M62.81);Difficulty in walking, not elsewhere classified (R26.2);Other symptoms and signs involving the nervous system (R29.898);Hemiplegia and hemiparesis Hemiplegia - Right/Left: Right Hemiplegia - dominant/non-dominant: Dominant Hemiplegia - caused by: Other Nontraumatic intracranial hemorrhage     Time: 3149-7026 PT Time Calculation (min) (ACUTE ONLY): 29 min  Charges:  $Gait Training: 8-22 mins $Therapeutic Activity: 8-22 mins                     Bonney Leitz , PTA Acute Rehabilitation Services Pager [phone] Office  [phone]     Florestine Avers 10/20/2019, 4:24 PM

## 2019-10-20 NOTE — Consult Note (Signed)
Physical Medicine and Rehabilitation Consult Reason for Consult: Stroke with functional deficits Referring Physician: Dr. Cristal Deer   HPI: Bushong Laaibah is a 43 year old female with history of HTN- no medications otherwise in good health who was admitted on 10/15/19 with inability to communicate and moaning incoherently after a nap that evening. UDS negative.  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 Follow up MRI brain showed stable left thalamic hemorrhage with mild progression of rim of edema. 2D echo showed EF of 60-65% with normal diastolic/systolic function and no wall abnormality. Bleed felt to be hypertensive in nature and Norvasc. NGT placed as patient NPO. MBS done 11/8 and she was started on dysphagia 3, thins as mild aspiration risk.     ROS: As indicated in HPI, 10 point review of systems otherwise negative. Limited by aphasia  Past Medical History:  Diagnosis Date   Bulky or enlarged uterus    Past Surgical History:  Procedure Laterality Date   CESAREAN SECTION     Family History  Problem Relation Age of Onset   Epilepsy Sister    Social History:  has no history on file for tobacco, alcohol, and drug. Allergies: No Known Allergies No medications prior to admission.    Home: Home Living Family/patient expects to be discharged to:: Private residence Living Arrangements: Spouse/significant other, Children Available Help at Discharge: Family, Available 24 hours/day Type of Home: House Home Access: Stairs to enter Technical brewer of Steps: 2 Home Layout: One level Bathroom Shower/Tub: Chiropodist: Standard Home Equipment: None  Functional History: Prior Function Level of Independence:  Independent Comments: Per pt's son, pt was independent for all ADLs and IADLs Functional Status:  Mobility: Bed Mobility Overal bed mobility: Needs Assistance Bed Mobility: Sidelying to Sit Sidelying to sit: Max assist Supine to sit: Max assist General bed mobility comments: pt in R sidelying, pt initiated pushing self up however due to R UE weakness pt required maxA for trunk elevation Transfers Overall transfer level: Needs assistance Equipment used: 1 person hand held assist Transfers: Sit to/from Stand, Stand Pivot Transfers Sit to Stand: Max assist Stand pivot transfers: Max assist General transfer comment: pt completed std pvt toward the left to [facility], maxA to initiated R LE stepping, tactile cues to straigthen R LE Ambulation/Gait Ambulation/Gait assistance: Max assist, +2 safety/equipment Gait Distance (Feet): 40 Feet Assistive device: 2 person hand held assist Gait Pattern/deviations: Step-through pattern, Decreased step length - right, Decreased stance time - right, Ataxic, Scissoring General Gait Details: pt with R lateral lean requiring tactile cues for weight shift to the L with R LE advancement, pt initially required maxA for R LE advancement however then began moving on own but required guidance for placement as R LE would adduct, tactile cues to R thigh to achieve knee extension during stance phase Gait velocity: slow Gait velocity interpretation: 1.31 - 2.62 ft/sec, indicative of limited community ambulator    ADL: ADL Overall ADL's : Needs assistance/impaired Eating/Feeding: Minimal assistance, Sitting, Set up Grooming: Minimal assistance, Sitting Upper Body Bathing: Moderate assistance, Sitting Lower Body Bathing: Sit to/from stand, +2 for physical assistance, +2 for safety/equipment, Maximal assistance, Moderate assistance Lower Body Bathing Details (indicate cue type and reason): When cued to  wash legs, pt required increased time and mod VCs to initiate task. Pt  attempted figure 4 method for washing legs. Pt required mod A to wash feet and back of legs, and max A +2 for peri-care.  Upper Body Dressing : Sitting, Minimal assistance Lower Body Dressing: Maximal assistance, Sit to/from stand, +2 for physical assistance Lower Body Dressing Details (indicate cue type and reason): Pt required max A to don socks sitting on 3N1, required max A +2 to power up to stand Toilet Transfer: Maximal assistance, +2 for physical assistance, +2 for safety/equipment, Ambulation, BSC Toilet Transfer Details (indicate cue type and reason): When walking into bathroom, pt began to urinate on the floor. BSC placed behind the pt and pt required max A +2 to transfer up onto raised BSC.  Toileting- Clothing Manipulation and Hygiene: Maximal assistance, +2 for physical assistance Toileting - Clothing Manipulation Details (indicate cue type and reason): Pt required max A +2 for peri-hygiene Functional mobility during ADLs: Maximal assistance, +2 for physical assistance, +2 for safety/equipment General ADL Comments: Pt required min A for UB ADLs, mod A-max A +2 for LB ADLs, and max A +2 for functional mobility. Pt demonstrated difficulty moving RLE to walk, required assist to move foot forward  Cognition: Cognition Overall Cognitive Status: No family/caregiver present to determine baseline cognitive functioning Arousal/Alertness: Awake/alert Orientation Level: Oriented to person, Disoriented to situation, Disoriented to time, Disoriented to place Attention: Sustained Sustained Attention: Appears intact Memory: (TBA) Awareness: Impaired Awareness Impairment: Intellectual impairment, Emergent impairment, Anticipatory impairment Problem Solving: (TBA) Safety/Judgment: Impaired Cognition Arousal/Alertness: Awake/alert Behavior During Therapy: Flat affect Overall Cognitive Status: No family/caregiver present to determine baseline cognitive functioning Area of Impairment: Following  commands, Problem solving Current Attention Level: Sustained Following Commands: Follows one step commands with increased time(must do demonstration due to global aphasia) Safety/Judgement: Decreased awareness of safety, Decreased awareness of deficits Awareness: Intellectual(had episode of urinary incontinence and didn't know) Problem Solving: Slow processing, Decreased initiation, Difficulty sequencing, Requires verbal cues, Requires tactile cues General Comments: pt with impaired receptive and expressive aphasia but followed simple visual cues accurately Difficult to assess due to: Non-English speaking  Blood pressure 130/90, pulse 68, temperature 97.8 F (36.6 C), temperature source Oral, resp. rate 18, height  (1.499 m), weight 63.6 kg, SpO2 100 %. Physical Exam  General: No apparent distress HEENT: Head is normocephalic, atraumatic, PERRLA, EOMI, sclera anicteric, oral mucosa pink and moist, dentition intact, ext ear canals clear,  Neck: Supple without JVD or lymphadenopathy Heart: Reg rate and rhythm. No murmurs rubs or gallops Chest: CTA bilaterally without wheezes, rales, or rhonchi; no distress Abdomen: Soft, non-tender, non-distended, bowel sounds positive. Extremities: No clubbing, cyanosis, or edema. Pulses are 2+ Skin: Clean and intact without signs of breakdown Neuro: Eyes open and awake. Severely dysarthric with right facial droop; CN otherwise intact. Follows commands. RUE 0/5. All other extremities antigravity, testing limited due to language barrier.  Musculoskeletal: Full ROM, No pain with AROM or PROM in the neck, trunk, or extremities. Posture appropriate Psych: Dull, flat. Pt is cooperative  Results for orders placed or performed during the hospital encounter of 10/15/19 (from the past 24 hour(s))  CBC     Status: Abnormal   Collection Time: 10/20/19  3:48 AM  Result Value Ref Range   WBC 5.1 4.0 - 10.5 K/uL   RBC 4.77 3.87 - 5.11 MIL/uL   Hemoglobin 12.2  12.0 - 15.0 g/dL   HCT 69.6 29.5 - 28.4 %   MCV  78.2 (L) 80.0 - 100.0 fL   MCH 25.6 (L) 26.0 - 34.0 pg   MCHC 32.7 30.0 - 36.0 g/dL   RDW 16.114.7 09.611.5 - 04.515.5 %   Platelets 236 150 - 400 K/uL   nRBC 0.0 0.0 - 0.2 %  Basic metabolic panel     Status: Abnormal   Collection Time: 10/20/19  3:48 AM  Result Value Ref Range   Sodium 136 135 - 145 mmol/L   Potassium 3.6 3.5 - 5.1 mmol/L   Chloride 106 98 - 111 mmol/L   CO2 20 (L) 22 - 32 mmol/L   Glucose, Bld 97 70 - 99 mg/dL   BUN 11 6 - 20 mg/dL   Creatinine, Ser 4.090.65 0.44 - 1.00 mg/dL   Calcium 8.9 8.9 - 81.110.3 mg/dL   GFR calc non Af Amer >60 >60 mL/min   GFR calc Af Amer >60 >60 mL/min   Anion gap 10 5 - 15   Dg Swallowing Func-speech Pathology  Result Date: 10/18/2019 Objective Swallowing Evaluation: Type of Study: MBS-Modified Barium Swallow Study  Patient Details Name: Jonette PesaOo Mantey MRN: [id number] Date of Birth: 02/04/1976 Today's Date: 10/18/2019 Time: SLP Start Time (ACUTE ONLY): 0950 -SLP Stop Time (ACUTE ONLY): 1015 SLP Time Calculation (min) (ACUTE ONLY): 25 min Past Medical History: No past medical history on file. Past Surgical History: The histories are not reviewed yet. Please review them in the "History" navigator section and refresh this SmartLink. HPI: Jonette PesaOo Kienitz is a 43 y.o. female with a PMHx only of HTN, not on any daily medication. Only prior surgical history is c-section.  Head CT on 11/5 revealed "Acute intraparenchymal hemorrhage centered at the left thalamus measures 2.4 x 1.7 x 1.3 cm (estimated volume 2.7 cc). Surrounding low-density vasogenic edema without significant regional mass effect. No significant midline shift. No intraventricular extension."  Subjective: Pt was alert and cooperative Assessment / Plan / Recommendation CHL IP CLINICAL IMPRESSIONS 10/18/2019 Clinical Impression Pt presents with mild oropharyngeal dysphagia c/b transient laryngeal penetration with thin liquid secondary to delayed swallow initiation and reduced  laryngeal closure.  No aspiration was observed with any trials and no laryngeal penetration was observed with nectar-thick liquid, puree, or regular solids.  Oral phase was remarkable for prolonged mastication of regular solids and decreased lingual strength resulting in trace oral residue during solid trial.  Additionally, pt was observed to have piecemeal deglutition during regular solid trial.  Pharyngeal phase was remarkable for reduced laryngeal closure resulting in laryngeal penetration and delayed swallow initiation at the level of the pyriforms with all liquid trials.  Pharyngoesophageal phase was unremarkable.  Recommend diet change to Dysphagia 3 (mech soft) solids and thin liquid with the following precautions: 1) Small bites/sips 2) Slow rate of intake 3) Sit upright 90 degrees.  Pt is at a mild risk for intermittent aspiration with thin liquid; however, use of compensatory strategies should decrease risk.  SLP Visit Diagnosis Dysphagia, oropharyngeal phase (R13.12) Attention and concentration deficit following -- Frontal lobe and executive function deficit following -- Impact on safety and function Mild aspiration risk   CHL IP TREATMENT RECOMMENDATION 10/18/2019 Treatment Recommendations Therapy as outlined in treatment plan below   Prognosis 10/18/2019 Prognosis for Safe Diet Advancement Good Barriers to Reach Goals -- Barriers/Prognosis Comment -- CHL IP DIET RECOMMENDATION 10/18/2019 SLP Diet Recommendations Thin liquid;Dysphagia 3 (Mech soft) solids Liquid Administration via Cup;Straw Medication Administration Whole meds with puree Compensations Slow rate;Small sips/bites Postural Changes Seated upright at 90 degrees   CHL  IP OTHER RECOMMENDATIONS 10/18/2019 Recommended Consults -- Oral Care Recommendations Oral care BID Other Recommendations --   CHL IP FOLLOW UP RECOMMENDATIONS 10/18/2019 Follow up Recommendations (No Data)   CHL IP FREQUENCY AND DURATION 10/18/2019 Speech Therapy Frequency (ACUTE  ONLY) min 1 x/week Treatment Duration 2 weeks      CHL IP ORAL PHASE 10/18/2019 Oral Phase Impaired Oral - Pudding Teaspoon -- Oral - Pudding Cup -- Oral - Honey Teaspoon -- Oral - Honey Cup -- Oral - Nectar Teaspoon -- Oral - Nectar Cup -- Oral - Nectar Straw WFL Oral - Thin Teaspoon WFL Oral - Thin Cup WFL Oral - Thin Straw WFL Oral - Puree WFL Oral - Mech Soft -- Oral - Regular Delayed oral transit;Piecemeal swallowing Oral - Multi-Consistency -- Oral - Pill -- Oral Phase - Comment --  CHL IP PHARYNGEAL PHASE 10/18/2019 Pharyngeal Phase Impaired Pharyngeal- Pudding Teaspoon -- Pharyngeal -- Pharyngeal- Pudding Cup -- Pharyngeal -- Pharyngeal- Honey Teaspoon -- Pharyngeal -- Pharyngeal- Honey Cup -- Pharyngeal -- Pharyngeal- Nectar Teaspoon -- Pharyngeal -- Pharyngeal- Nectar Cup -- Pharyngeal -- Pharyngeal- Nectar Straw Delayed swallow initiation-pyriform sinuses Pharyngeal Material does not enter airway Pharyngeal- Thin Teaspoon Delayed swallow initiation-pyriform sinuses Pharyngeal Material does not enter airway Pharyngeal- Thin Cup Delayed swallow initiation-pyriform sinuses;Reduced airway/laryngeal closure;Penetration/Aspiration before swallow Pharyngeal Material enters airway, remains ABOVE vocal cords then ejected out Pharyngeal- Thin Straw Delayed swallow initiation-pyriform sinuses;Reduced airway/laryngeal closure;Penetration/Aspiration before swallow Pharyngeal Material enters airway, remains ABOVE vocal cords then ejected out Pharyngeal- Puree Delayed swallow initiation-vallecula Pharyngeal Material does not enter airway Pharyngeal- Mechanical Soft -- Pharyngeal -- Pharyngeal- Regular Delayed swallow initiation-vallecula Pharyngeal Material does not enter airway Pharyngeal- Multi-consistency -- Pharyngeal -- Pharyngeal- Pill -- Pharyngeal -- Pharyngeal Comment --  CHL IP CERVICAL ESOPHAGEAL PHASE 10/18/2019 Cervical Esophageal Phase WFL Pudding Teaspoon -- Pudding Cup -- Honey Teaspoon -- Honey Cup --  Nectar Teaspoon -- Nectar Cup -- Nectar Straw -- Thin Teaspoon -- Thin Cup -- Thin Straw -- Puree -- Mechanical Soft -- Regular -- Multi-consistency -- Pill -- Cervical Esophageal Comment -- Villa Herb M.S., CCC-SLP Acute Rehabilitation Services Office: [phone] Shanon Rosser [facility] 10/18/2019, 10:45 AM              Assessment/Plan: Arlanda Shiplett is a 44 year old female with history of HTN- no medications otherwise in good health who was admitted on 10/15/19 with inability to communicate and moaning incoherently after a nap that evening. 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 Follow up MRI brain showed stable left thalamic hemorrhage with mild progression of rim of edema.   --Mrs. Destyni Donate would be an excellent candidate for acute rehabilitation. Her impairments include dysarthria, receptive and expressive aphasia, and significant right arm > leg weakness. She would be able to tolerate 3 hours of daily therapy. Currently MaxA x2 in ambulating 40 feet. --Upon discharge, patient will have 24/7 assistance from family. --Admission currently pending insurance authorization.    Godfrey Pick, MD 10/20/2019

## 2019-10-20 NOTE — Progress Notes (Addendum)
PROGRESS NOTE  Lindsay Howard RJJ:[id number] DOB: 1976-08-19 DOA: 10/15/2019 PCP: Patient, No Pcp Per  HPI/Recap of past 24 hours: [name] is a 43 year old female with history of hypertension not on medication but otherwise in good health until October 15, 2019 when she was admitted with inability to communicate and only moaning incoherently she was found to have a stroke by MRI of the brain that showed stable left thalamic hemorrhage with mild progression of rim of edema.  She has been started on antihypertensive with Norvasc she was initially on NG tube with n.p.o. but now she is able to feed.  October 20, 2019 subjective: Patient seen and examined at bedside.  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  Hypertension, continue Norvasc for high blood pressure   3.  Dysphagia.  Patient is getting speech therapy  Code Status: Full  Severity of Illness: The appropriate patient status for this patient is INPATIENT. Inpatient status is judged to be reasonable and necessary in order to provide the required intensity of service to ensure the patient's safety. The patient's presenting symptoms, physical exam findings, and initial radiographic and laboratory data in the context of their chronic comorbidities is felt to place them at high risk for further clinical deterioration. Furthermore, it is not anticipated that the patient will be medically stable for discharge from the hospital within 2 midnights of admission. The following factors support the patient status of inpatient.   " The patient's presenting symptoms include stroke. " The worrisome physical exam findings include the right upper extremity with weakness. " The initial  radiographic and laboratory data are worrisome because of stroke. " The chronic co-morbidities include stroke.   * I certify that at the point of admission it is my clinical judgment that the patient will require inpatient hospital care spanning beyond 2 midnights from the point of admission due to high intensity of service, high risk for further deterioration and high frequency of surveillance required.*    Family Communication: None  Disposition Plan: CIR   Consultants:  Stroke team  Procedures:  None  Antimicrobials:  None  DVT prophylaxis: None due to bleed will order TED hose   Objective: Vitals:   10/19/19 1600 10/19/19 1700 10/19/19 1800 10/19/19 2349  BP: 124/82 111/66 133/85 130/90  Pulse: 71 71 73 68  Resp: 19 (!) 22 18 18   Temp: 97.6 F (36.4 C)   97.8 F (36.6 C)  TempSrc: Oral   Oral  SpO2: 98% 100% 100% 100%  Weight:      Height:        Intake/Output Summary (Last 24 hours) at 10/20/2019 1004 Last data filed at 10/19/2019 1800 Gross per 24 hour  Intake 240 ml  Output 600 ml  Net -360 ml   Filed Weights   10/16/19 1334  Weight: 63.6 kg   Body mass index is 28.32 kg/m.  Exam:  . General: 43 y.o. year-old female well developed well nourished in no acute distress.  Alert and oriented x3. . Cardiovascular: Regular rate and rhythm with no rubs or gallops.  No thyromegaly or JVD noted.   Marland Kitchen Respiratory: Clear to auscultation with no wheezes or rales. Good inspiratory effort. . Abdomen: Soft nontender nondistended with normal bowel  sounds x4 quadrants. . Musculoskeletal: No lower extremity edema. 2/4 pulses in all 4 extremities. . Skin: No ulcerative lesions noted or rashes, . Psychiatry: Mood is appropriate for condition and setting . Neurology:She is alert with aphasia but she was nodding her head to questions making eye contact.  She is flaccid on the right upper extremity but able to lift her right leg of the bed.  She did show me willingly  without prompting     Data Reviewed: CBC: Recent Labs  Lab 10/15/19 1907 10/15/19 1939 10/17/19 0432 10/18/19 0551 10/19/19 0552 10/20/19 0348  WBC 4.9  --  6.3 6.9 5.3 5.1  NEUTROABS 2.4  --   --   --   --   --   HGB 11.9* 12.9 12.0 11.9* 11.7* 12.2  HCT 37.6 38.0 37.7 36.8 36.5 37.3  MCV 79.7*  --  79.0* 78.1* 78.3* 78.2*  PLT 239  --  242 219 237 236   Basic Metabolic Panel: Recent Labs  Lab 10/15/19 1907 10/15/19 1939 10/17/19 0432 10/18/19 0551 10/19/19 0552 10/20/19 0348  NA 137 142 137 137 136 136  K 3.8 3.9 3.6 3.8 3.7 3.6  CL 104 105 104 104 106 106  CO2 23  --  22 22 21* 20*  GLUCOSE 96 90 101* 90 89 97  BUN 9 10 10 8 10 11   CREATININE 0.73 0.60 0.74 0.71 0.80 0.65  CALCIUM 9.0  --  8.8* 8.6* 8.6* 8.9   GFR: Estimated Creatinine Clearance: 75.1 mL/min (by C-G formula based on SCr of 0.65 mg/dL). Liver Function Tests: Recent Labs  Lab 10/15/19 1907  AST 113*  ALT 110*  ALKPHOS 94  BILITOT 0.7  PROT 8.7*  ALBUMIN 3.5   No results for input(s): LIPASE, AMYLASE in the last 168 hours. Recent Labs  Lab 10/16/19 0106  AMMONIA 28   Coagulation Profile: Recent Labs  Lab 10/15/19 1907  INR 1.0   Cardiac Enzymes: No results for input(s): CKTOTAL, CKMB, CKMBINDEX, TROPONINI in the last 168 hours. BNP (last 3 results) No results for input(s): PROBNP in the last 8760 hours. HbA1C: No results for input(s): HGBA1C in the last 72 hours. CBG: Recent Labs  Lab 10/15/19 1930  GLUCAP 92   Lipid Profile: No results for input(s): CHOL, HDL, LDLCALC, TRIG, CHOLHDL, LDLDIRECT in the last 72 hours. Thyroid Function Tests: No results for input(s): TSH, T4TOTAL, FREET4, T3FREE, THYROIDAB in the last 72 hours. Anemia Panel: No results for input(s): VITAMINB12, FOLATE, FERRITIN, TIBC, IRON, RETICCTPCT in the last 72 hours. Urine analysis:    Component Value Date/Time   COLORURINE YELLOW 10/16/2019 0608   APPEARANCEUR CLEAR 10/16/2019 0608   LABSPEC  1.034 (H) 10/16/2019 0608   PHURINE 6.0 10/16/2019 0608   GLUCOSEU NEGATIVE 10/16/2019 0608   HGBUR NEGATIVE 10/16/2019 0608   BILIRUBINUR NEGATIVE 10/16/2019 0608   KETONESUR NEGATIVE 10/16/2019 0608   PROTEINUR NEGATIVE 10/16/2019 0608   NITRITE NEGATIVE 10/16/2019 0608   LEUKOCYTESUR NEGATIVE 10/16/2019 0608   Sepsis Labs: @LABRCNTIP (procalcitonin:4,lacticidven:4)  ) Recent Results (from the past 240 hour(s))  SARS CORONAVIRUS 2 (TAT 6-24 HRS) Nasopharyngeal Nasopharyngeal Swab     Status: None   Collection Time: 10/15/19 10:00 PM   Specimen: Nasopharyngeal Swab  Result Value Ref Range Status   SARS Coronavirus 2 NEGATIVE NEGATIVE Final    Comment: (NOTE) SARS-CoV-2 target nucleic acids are NOT DETECTED. The SARS-CoV-2 RNA is generally detectable in upper and lower respiratory specimens during the acute phase of infection. Negative  results do not preclude SARS-CoV-2 infection, do not rule out co-infections with other pathogens, and should not be used as the sole basis for treatment or other patient management decisions. Negative results must be combined with clinical observations, patient history, and epidemiological information. The expected result is Negative. Fact Sheet for Patients: HairSlick.no Fact Sheet for Healthcare Providers: quierodirigir.com This test is not yet approved or cleared by the [place] FDA and  has been authorized for detection and/or diagnosis of SARS-CoV-2 by FDA under an Emergency Use Authorization (EUA). This EUA will remain  in effect (meaning this test can be used) for the duration of the COVID-19 declaration under Section 56 4(b)(1) of the Act, 21 U.S.C. section 360bbb-3(b)(1), unless the authorization is terminated or revoked sooner. Performed at [facility], [address] [place], [place] [address]   MRSA PCR Screening     Status: None   Collection Time: 10/16/19  1:32  PM   Specimen: Nasal Mucosa; Nasopharyngeal  Result Value Ref Range Status   MRSA by PCR NEGATIVE NEGATIVE Final    Comment:        The GeneXpert MRSA Assay (FDA approved for NASAL specimens only), is one component of a comprehensive MRSA colonization surveillance program. It is not intended to diagnose MRSA infection nor to guide or monitor treatment for MRSA infections. Performed at [facility], [address] [place], [place] [address]       Studies: No results found.  Scheduled Meds: .  stroke: mapping our early stages of recovery book   Does not apply Once  . amLODipine  10 mg Oral Daily  . Chlorhexidine Gluconate Cloth  6 each Topical Daily  . feeding supplement (ENSURE ENLIVE)  237 mL Oral BID BM  . mouth rinse  15 mL Mouth Rinse BID  . pantoprazole  40 mg Oral QHS  . senna-docusate  1 tablet Oral BID    Continuous Infusions: . sodium chloride 50 mL/hr at 10/20/19 0500     LOS: 5 days     Myrtie Neither, MD [facility]  To reach me or the doctor on call, go to: [email or web address] Password [email or web address]  10/20/2019, 10:04 AM

## 2019-10-20 NOTE — Progress Notes (Signed)
STROKE TEAM PROGRESS NOTE   INTERVAL HISTORY Patient is lying  in bed being given a bath.  She is awake and a little interactive.  She is aphasic not able to speak but follows a few pantomime commandsl.  Her family is not at bedside this morning, language barrier, cannot use interpreter line.  Blood pressure adequately controlled.She passed swallow eval and is now on a dysphagia 3 diet. She is now transferred to medical hospitalist team as primary attending. Vitals:   10/19/19 1600 10/19/19 1700 10/19/19 1800 10/19/19 2349  BP: 124/82 111/66 133/85 130/90  Pulse: 71 71 73 68  Resp: 19 (!) 22 18 18   Temp: 97.6 F (36.4 C)   97.8 F (36.6 C)  TempSrc: Oral   Oral  SpO2: 98% 100% 100% 100%  Weight:      Height:        CBC:  Recent Labs  Lab 10/15/19 1907  10/19/19 0552 10/20/19 0348  WBC 4.9   < > 5.3 5.1  NEUTROABS 2.4  --   --   --   HGB 11.9*   < > 11.7* 12.2  HCT 37.6   < > 36.5 37.3  MCV 79.7*   < > 78.3* 78.2*  PLT 239   < > 237 236   < > = values in this interval not displayed.    Basic Metabolic Panel:  Recent Labs  Lab 10/19/19 0552 10/20/19 0348  NA 136 136  K 3.7 3.6  CL 106 106  CO2 21* 20*  GLUCOSE 89 97  BUN 10 11  CREATININE 0.80 0.65  CALCIUM 8.6* 8.9   Lipid Panel:     Component Value Date/Time   CHOL 177 10/17/2019 0432   TRIG 156 (H) 10/17/2019 0432   HDL 23 (L) 10/17/2019 0432   CHOLHDL 7.7 10/17/2019 0432   VLDL 31 10/17/2019 0432   LDLCALC 123 (H) 10/17/2019 0432   HgbA1c:  Lab Results  Component Value Date   HGBA1C 5.7 (H) 10/16/2019   Urine Drug Screen:     Component Value Date/Time   LABOPIA NONE DETECTED 10/16/2019 0608   COCAINSCRNUR NONE DETECTED 10/16/2019 0608   LABBENZ NONE DETECTED 10/16/2019 0608   AMPHETMU NONE DETECTED 10/16/2019 0608   THCU NONE DETECTED 10/16/2019 0608   LABBARB NONE DETECTED 10/16/2019 0608    Alcohol Level     Component Value Date/Time   ETH <10 10/15/2019 2122    IMAGING  Ct Angio  Head W Or Wo Contrast Ct Angio Neck W And/or Wo Contrast 10/15/2019   CT HEAD  IMPRESSION:  2.7 cc acute intraparenchymal hemorrhage centered at the left thalamus. Mild localized edema without significant regional mass effect. No midline shift or intraventricular extension.   CTA HEAD AND NECK  IMPRESSION:  1. Normal CTA of the head and neck. No large vessel occlusion or hemodynamically significant stenosis.  2. No vascular abnormality seen underlying the left thalamic hemorrhage.   Dg Abdomen 1 View 10/16/2019 IMPRESSION:  Nasogastric tube tip and side hole in the proximal stomach.  Dg Abdomen 1 View 10/16/2019 IMPRESSION:  No acute abnormality. The previously seen nasogastric tube is no longer visualized.   Ct Head Wo Contrast 10/16/2019 IMPRESSION:  1. No significant interval change in size of left thalamic intraparenchymal hemorrhage, with mildly increased surrounding vasogenic edema with trace localized left-to-right midline shift. No hydrocephalus or ventricular trapping. No intraventricular extension of hemorrhage.  2. No other new acute intracranial abnormality.   Mr Brain Lottie Dawson  Contrast 10/16/2019   ADDENDUM: Omitted impression #4 of generally low marrow signal, please correlate with CBC.  IMPRESSION:  1. Left thalamic hematoma with size stability since earlier CTA. The rim of edema has progressed.  2. No evidence of underlying mass or primary infarct.  3. Minimal white matter disease.   Dg Chest Portable 1 View 10/15/2019 IMPRESSION:  1. No evidence of active cardiopulmonary disease.  2. UPPER limits normal heart size.   Dg Abd Portable 1v 10/16/2019 IMPRESSION:  NG tube tip projects over the gastric body    PHYSICAL EXAM  Temp:  [97.6 F (36.4 C)-97.8 F (36.6 C)] 97.8 F (36.6 C) (11/09 2349) Pulse Rate:  [65-73] 68 (11/09 2349) Resp:  [16-22] 18 (11/09 2349) BP: (111-133)/(66-90) 130/90 (11/09 2349) SpO2:  [97 %-100 %] 100 % (11/09 2349)  General -frail  middle-aged Asian lady not in distress.  Lethargic.but eyes open and responsive  Ophthalmologic - fundi not visualized due to noncooperation.  Cardiovascular - Regular rhythm and rate.  Neuro - alert awake,   following commands with visual cues, language barrier, difficult testing language function. Conjugate midline gaze tracks examiner across the room, PERRL, EOMI. Blinking to visual threat bilaterally. Right facial droop. Tongue protrusion not cooperative. Right UE flicker with pain, RLE 2/5 with pain. LUE and LLE spontaneous movement against gravity. DTR 1+ right upgoing toe. Sensation, coordination and gait not tested.  ASSESSMENT/PLAN [name] is a 43 y.o. female from [place] with history of HTN found not verbally responsive.   ICH:   L thalamic hemorrhage, likely hypertensive  CT head 2.7cm L thalamic hemorrhage   CTA head & neck Unremarkable   MRI - 10/16/19 - stable L thalamic hemorrhage   Repeat CT head 10/16/19 - No significant interval change - the rim of edema has progressed - generally low marrow signal, please correlate with CBC (low MCV and MCH - Hb normal)  2D Echo EF 60-65%  LDL - 123  HgbA1c 5.7  SCDs for VTE prophylaxis  No antithrombotic prior to admission, now on No antithrombotic given hemorrhage   Therapy recommendations: CLR  Disposition:  pending   Hypertension  Highest BP listed 154/90 in EMR  Home meds:  None listed . SBP goal < 140 . SBP 90s - 120s today  Off cleviprex now, will stop and give prns, consider increasing norvasc or adding oral agent (Family does not know what her home meds were)  On norvasc 5  . Long-term BP goal normotensive  Dysphagia . Secondary to stroke . Speech on board  Other Stroke Risk Factors  Smokeless tobacco user  Other Active Problems Plan    Mobilize out of bed.  Therapy and rehab consults.  No family available at the bedside for discussion patient is aphasic and even using a translator is not going to  improve communication with her.  Continue strict blood pressure control with systolic BP goal below 160.  Continue ongoing therapies.  Transfer to inpatient rehab when bed available in the next few days.   Lindsay Heady MD Stroke Neurology       10/20/2019 1:05 PM  To contact Stroke Continuity provider, please refer to [email or web address]. After hours, contact General Neurology

## 2019-10-21 ENCOUNTER — Other Ambulatory Visit: Payer: Self-pay

## 2019-10-21 ENCOUNTER — Inpatient Hospital Stay (HOSPITAL_COMMUNITY)
Admission: RE | Admit: 2019-10-21 | Discharge: 2019-11-03 | DRG: 057 | Disposition: A | Discharge status: Home Health | Payer: BLUE CROSS/BLUE SHIELD | Source: Intra-hospital | Attending: Physical Medicine & Rehabilitation | Admitting: Physical Medicine & Rehabilitation

## 2019-10-21 ENCOUNTER — Encounter (HOSPITAL_COMMUNITY): Payer: Self-pay | Admitting: Physical Medicine and Rehabilitation

## 2019-10-21 DIAGNOSIS — Z789 Other specified health status: Secondary | ICD-10-CM | POA: Diagnosis not present

## 2019-10-21 DIAGNOSIS — K5900 Constipation, unspecified: Secondary | ICD-10-CM | POA: Diagnosis present

## 2019-10-21 DIAGNOSIS — R7401 Elevation of levels of liver transaminase levels: Secondary | ICD-10-CM

## 2019-10-21 DIAGNOSIS — I10 Essential (primary) hypertension: Secondary | ICD-10-CM | POA: Diagnosis not present

## 2019-10-21 DIAGNOSIS — Z7409 Other reduced mobility: Secondary | ICD-10-CM

## 2019-10-21 DIAGNOSIS — Z23 Encounter for immunization: Secondary | ICD-10-CM

## 2019-10-21 DIAGNOSIS — I610 Nontraumatic intracerebral hemorrhage in hemisphere, subcortical: Secondary | ICD-10-CM | POA: Diagnosis not present

## 2019-10-21 DIAGNOSIS — I69220 Aphasia following other nontraumatic intracranial hemorrhage: Secondary | ICD-10-CM

## 2019-10-21 DIAGNOSIS — I69251 Hemiplegia and hemiparesis following other nontraumatic intracranial hemorrhage affecting right dominant side: Principal | ICD-10-CM

## 2019-10-21 DIAGNOSIS — K5901 Slow transit constipation: Secondary | ICD-10-CM

## 2019-10-21 DIAGNOSIS — Z79899 Other long term (current) drug therapy: Secondary | ICD-10-CM

## 2019-10-21 LAB — CBC
HCT: 37.3 % (ref 36.0–46.0)
Hemoglobin: 12.2 g/dL (ref 12.0–15.0)
MCH: 25.4 pg — ABNORMAL LOW (ref 26.0–34.0)
MCHC: 32.7 g/dL (ref 30.0–36.0)
MCV: 77.5 fL — ABNORMAL LOW (ref 80.0–100.0)
Platelets: 243 10*3/uL (ref 150–400)
RBC: 4.81 MIL/uL (ref 3.87–5.11)
RDW: 14.7 % (ref 11.5–15.5)
WBC: 4.8 10*3/uL (ref 4.0–10.5)
nRBC: 0 % (ref 0.0–0.2)

## 2019-10-21 LAB — BASIC METABOLIC PANEL
Anion gap: 10 (ref 5–15)
BUN: 9 mg/dL (ref 6–20)
CO2: 22 mmol/L (ref 22–32)
Calcium: 8.9 mg/dL (ref 8.9–10.3)
Chloride: 105 mmol/L (ref 98–111)
Creatinine, Ser: 0.64 mg/dL (ref 0.44–1.00)
GFR calc Af Amer: >60 mL/min (ref >60)
GFR calc non Af Amer: >60 mL/min (ref >60)
Glucose, Bld: 94 mg/dL (ref 70–99)
Potassium: 3.4 mmol/L — ABNORMAL LOW (ref 3.5–5.1)
Sodium: 137 mmol/L (ref 135–145)

## 2019-10-21 MED ORDER — AMLODIPINE BESYLATE 10 MG PO TABS: 10.0000 mg | ORAL_TABLET | Freq: Every day | ORAL | Status: DC

## 2019-10-21 MED ORDER — BISACODYL 10 MG RE SUPP
10.0000 mg | Freq: Every day | RECTAL | Status: DC | PRN
  Administered 2019-10-23: 10 mg via RECTAL
  Filled 2019-10-21: qty 1

## 2019-10-21 MED ORDER — SENNOSIDES-DOCUSATE SODIUM 8.6-50 MG PO TABS: 1.0000 | ORAL_TABLET | Freq: Two times a day (BID) | ORAL | Status: DC

## 2019-10-21 MED ORDER — ENSURE ENLIVE PO LIQD: 237.0000 mL | Freq: Two times a day (BID) | ORAL | 12 refills | Status: DC

## 2019-10-21 MED ORDER — POLYETHYLENE GLYCOL 3350 17 G PO PACK: 17.0000 g | PACK | Freq: Every day | ORAL | Status: DC | PRN

## 2019-10-21 MED ORDER — ENOXAPARIN SODIUM 40 MG/0.4ML ~~LOC~~ SOLN
40.0000 mg | SUBCUTANEOUS | Status: DC
  Administered 2019-10-21 – 2019-11-02 (×13): 40 mg via SUBCUTANEOUS
  Filled 2019-10-21 (×14): qty 0.4

## 2019-10-21 MED ORDER — PROCHLORPERAZINE EDISYLATE 10 MG/2ML IJ SOLN: 5.0000 mg | Freq: Four times a day (QID) | INTRAMUSCULAR | Status: DC | PRN

## 2019-10-21 MED ORDER — DIPHENHYDRAMINE HCL 12.5 MG/5ML PO ELIX: 12.5000 mg | ORAL_SOLUTION | Freq: Four times a day (QID) | ORAL | Status: DC | PRN

## 2019-10-21 MED ORDER — INFLUENZA VAC SPLIT QUAD 0.5 ML IM SUSY
0.5000 mL | PREFILLED_SYRINGE | INTRAMUSCULAR | Status: AC
  Administered 2019-10-22: 0.5 mL via INTRAMUSCULAR
  Filled 2019-10-21: qty 0.5

## 2019-10-21 MED ORDER — FLEET ENEMA 7-19 GM/118ML RE ENEM: 1.0000 | ENEMA | Freq: Once | RECTAL | Status: DC | PRN

## 2019-10-21 MED ORDER — GUAIFENESIN-DM 100-10 MG/5ML PO SYRP: 5.0000 mL | ORAL_SOLUTION | Freq: Four times a day (QID) | ORAL | Status: DC | PRN

## 2019-10-21 MED ORDER — SENNOSIDES-DOCUSATE SODIUM 8.6-50 MG PO TABS
1.0000 | ORAL_TABLET | Freq: Two times a day (BID) | ORAL | Status: DC
  Administered 2019-10-21 – 2019-10-23 (×4): 1 via ORAL
  Filled 2019-10-21 (×4): qty 1

## 2019-10-21 MED ORDER — PROCHLORPERAZINE 25 MG RE SUPP: 12.5000 mg | Freq: Four times a day (QID) | RECTAL | Status: DC | PRN

## 2019-10-21 MED ORDER — PANTOPRAZOLE SODIUM 40 MG PO TBEC
40.0000 mg | DELAYED_RELEASE_TABLET | Freq: Every day | ORAL | Status: DC
  Administered 2019-10-21 – 2019-11-02 (×14): 40 mg via ORAL
  Filled 2019-10-21 (×13): qty 1

## 2019-10-21 MED ORDER — STROKE: EARLY STAGES OF RECOVERY BOOK: 1.0000 | Freq: Once | Status: DC

## 2019-10-21 MED ORDER — ALUM & MAG HYDROXIDE-SIMETH 200-200-20 MG/5ML PO SUSP: 30.0000 mL | ORAL | Status: DC | PRN

## 2019-10-21 MED ORDER — ACETAMINOPHEN 325 MG PO TABS
325.0000 mg | ORAL_TABLET | ORAL | Status: DC | PRN
  Administered 2019-10-21 – 2019-10-29 (×7): 650 mg via ORAL
  Filled 2019-10-21 (×7): qty 2

## 2019-10-21 MED ORDER — ENSURE ENLIVE PO LIQD
237.0000 mL | Freq: Two times a day (BID) | ORAL | Status: DC
  Administered 2019-10-22 – 2019-11-03 (×19): 237 mL via ORAL

## 2019-10-21 MED ORDER — AMLODIPINE BESYLATE 10 MG PO TABS
10.0000 mg | ORAL_TABLET | Freq: Every day | ORAL | Status: DC
  Administered 2019-10-22 – 2019-11-03 (×13): 10 mg via ORAL
  Filled 2019-10-21 (×13): qty 1

## 2019-10-21 MED ORDER — TRAZODONE HCL 50 MG PO TABS: 25.0000 mg | ORAL_TABLET | Freq: Every evening | ORAL | Status: DC | PRN

## 2019-10-21 MED ORDER — PROCHLORPERAZINE MALEATE 5 MG PO TABS: 5.0000 mg | ORAL_TABLET | Freq: Four times a day (QID) | ORAL | Status: DC | PRN

## 2019-10-21 MED ORDER — PANTOPRAZOLE SODIUM 40 MG PO TBEC: 40.0000 mg | DELAYED_RELEASE_TABLET | Freq: Every day | ORAL | Status: DC

## 2019-10-21 NOTE — PMR Pre-admission (Signed)
PMR Admission Coordinator Pre-Admission Assessment  Patient: Lindsay Howard is an 43 y.o., female MRN: [id number] DOB: 22-Dec-1975 Height: 4' 11"  (149.9 cm) Weight: 63.6 kg  Insurance Information HMO:     PPO: yes     PCP:      IPA:      80/20:      OTHER:  PRIMARY: [employer] [place]      Policy#: [id number]      Subscriber: patient CM Name: Jonelle Sidle      Phone#: [phone]     Fax#: [phone] Pre-Cert#: [id number] Wallace for CIR provided by Tiffany for 11/11 admission with updates due to fax listed above on 11/17      Employer:  Benefits:  Phone #: [phone]     Name:  Eff. Date: 05/11/19     Deduct: $1500 ($0 met)      Out of Pocket Max: $4500 ($0 met)      Life Max: n/a CIR: 80%      SNF: 80% Outpatient: 80%     Co-Pay: 20% Home Health: 80%      Co-Pay: 20% DME: 80%     Co-Pay: 20% Providers:  SECONDARY:       Policy#:       Subscriber:  CM Name:       Phone#:      Fax#:  Pre-Cert#:       Employer:  Benefits:  Phone #:      Name:  Eff. Date:      Deduct:       Out of Pocket Max:       Life Max:  CIR:       SNF:  Outpatient:      Co-Pay:  Home Health:       Co-Pay:  DME:      Co-Pay:   Medicaid Application Date:       Case Manager:  Disability Application Date:       Case Worker:   The "Data Collection Information Summary" for patients in Inpatient Rehabilitation Facilities with attached "Privacy Act [facility] Records" was provided and verbally reviewed with: N/A  Emergency Contact Information Contact Information    Name Relation Home Work Mobile   Lindsay Howard Niece [phone]        Current Medical History  Patient Admitting Diagnosis: L MCA   History of Present Illness: Lindsay Howard is a 43 year old non-english speaking female from [place] with history of HTN- no medications otherwise in good health who was admitted on 10/15/19 with inability to communicate and moaning incoherently after a nap that evening.  She speaks [name] (also Insurance account manager).  UDS negative.  CTA  head/neck done revealing 2.7 cm IPH centered at left thalamus with mild localized edema and no vascular abnormality or LVO.  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  She was started on Clevidipine drip for better control. Follow up MRI brain showed stable left thalamic hemorrhage with mild progression of rim of edema. 2D echo showed EF of 60-65% with normal diastolic/systolic function and no wall abnormality. Bleed felt to be hypertensive in nature and Norvasc started. NGT placed as patient NPO.  F/u MBS done 11/8 and she was started on dysphagia 3, thins as mild aspiration risk. She continues to have expressive aphasia with left lean, right sided weakness.  Therapy evaluations were completed and pt was recommended for CIR.   Complete NIHSS TOTAL: 5  Patient's medical record from  [facility] has been reviewed by the rehabilitation admission coordinator and physician.  Past Medical History  Past Medical History:  Diagnosis Date  . Bulky or enlarged uterus     Family History   family history includes Epilepsy in her sister.  Prior Rehab/Hospitalizations Has the patient had prior rehab or hospitalizations prior to admission? No  Has the patient had major surgery during 100 days prior to admission? No   Current Medications  Current Facility-Administered Medications:  .   stroke: mapping our early stages of recovery book, , Does not apply, Once, Beulah Gandy A, NP .  0.9 %  sodium chloride infusion, , Intravenous, Continuous, Beulah Gandy A, NP, Last Rate: 50 mL/hr at 10/21/19 0150 .  acetaminophen (TYLENOL) tablet 650 mg, 650 mg, Oral, Q4H PRN **OR** acetaminophen (TYLENOL) 160 MG/5ML solution 650 mg, 650 mg, Per Tube, Q4H PRN, 650 mg at 10/17/19 1447 **OR** acetaminophen (TYLENOL) suppository 650 mg, 650 mg, Rectal, Q4H PRN, Beulah Gandy A, NP .  amLODipine (NORVASC) tablet 10 mg, 10 mg, Oral, Daily, Beulah Gandy A, NP, 10 mg at 10/21/19  0926 .  Chlorhexidine Gluconate Cloth 2 % PADS 6 each, 6 each, Topical, Daily, Beulah Gandy A, NP, 6 each at 10/21/19 [phone] .  feeding supplement (ENSURE ENLIVE) (ENSURE ENLIVE) liquid 237 mL, 237 mL, Oral, BID BM, Beulah Gandy A, NP, 237 mL at 10/21/19 0926 .  hydrALAZINE (APRESOLINE) injection 10-20 mg, 10-20 mg, Intravenous, Q4H PRN, Beulah Gandy A, NP .  MEDLINE mouth rinse, 15 mL, Mouth Rinse, BID, Beulah Gandy A, NP, 15 mL at 10/20/19 1041 .  pantoprazole (PROTONIX) EC tablet 40 mg, 40 mg, Oral, QHS, Beulah Gandy A, NP, 40 mg at 10/20/19 2152 .  senna-docusate (Senokot-S) tablet 1 tablet, 1 tablet, Oral, BID, Beulah Gandy A, NP, 1 tablet at 10/21/19 1157  Patients Current Diet:  Diet Order            Diet regular Room service appropriate? Yes; Fluid consistency: Thin  Diet effective now              Precautions / Restrictions Precautions Precautions: Fall Precaution Comments: R hemiparesis Restrictions Weight Bearing Restrictions: No   Has the patient had 2 or more falls or a fall with injury in the past year? 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 Independent  Indoor Mobility: Did the patient need assistance with walking from room to room (with or without device)? Independent  Stairs: Did the patient need assistance with internal or external stairs (with or without device)? Independent  Functional Cognition: Did the patient need help planning regular tasks such as shopping or remembering to take medications? Independent  Home Assistive Devices / Equipment Home Assistive Devices/Equipment: None Home Equipment: None  Prior Device Use: Indicate devices/aids used by the patient prior to current illness, exacerbation or injury? None of the above  Current Functional Level Cognition   Arousal/Alertness: Awake/alert Overall Cognitive Status: No family/caregiver present to determine baseline cognitive functioning Difficult to assess due to: Non-English speaking, Impaired communication Current Attention Level: Sustained Orientation Level: Oriented to person, Oriented to place, Oriented to time, Oriented to situation Following Commands: (Pt needed mod demonstrational commands during functional reach with the RUE.) Safety/Judgement: Decreased awareness of safety, Decreased awareness of deficits General Comments: responds well to gesturing.  Did not use interpreter  due to aphasia. Attention: Sustained Sustained Attention: Appears intact Memory: (TBA) Awareness: Impaired Awareness Impairment: Intellectual impairment, Emergent impairment, Anticipatory impairment Problem Solving: (TBA) Safety/Judgment: Impaired    Extremity Assessment (includes Sensation/Coordination)  Upper Extremity Assessment: Defer to OT evaluation RUE Deficits / Details: Pt presenting with limited AROM of RUE, decreased grip strength, and poor coordination. Pt required assist to flex shoulder and elbow. Pt not performing thumb opposition with RUE.  RUE Coordination: decreased fine motor, decreased gross motor  Lower Extremity Assessment: RLE deficits/detail RLE Deficits / Details: grossly 4-/5, impaired gross coordination    ADLs  Overall ADL's : Needs assistance/impaired Eating/Feeding: Minimal assistance, Sitting, Set up Grooming: Wash/dry hands, Minimal assistance Upper Body Bathing: Moderate assistance, Sitting Lower Body Bathing: Sit to/from stand, +2 for physical assistance, +2 for safety/equipment, Maximal assistance, Moderate assistance Lower Body Bathing Details (indicate cue type and reason): When cued to wash legs, pt required increased time and mod VCs to initiate task. Pt attempted figure 4 method for washing legs. Pt required mod A to wash feet and back of legs, and max A +2 for peri-care.   Upper Body Dressing : Sitting, Minimal assistance Lower Body Dressing: Maximal assistance, Sit to/from stand, +2 for physical assistance Lower Body Dressing Details (indicate cue type and reason): Pt required max A to don socks sitting on 3N1, required max A +2 to power up to stand Toilet Transfer: Moderate assistance, BSC, Stand-pivot Toilet Transfer Details (indicate cue type and reason): When walking into bathroom, pt began to urinate on the floor. BSC placed behind the pt and pt required max A +2 to transfer up onto raised BSC.  Toileting- Clothing Manipulation and Hygiene: Moderate assistance, Sit to/from stand Toileting - Clothing Manipulation Details (indicate cue type and reason): Pt required max A +2 for peri-hygiene Functional mobility during ADLs: Maximal assistance, +2 for physical assistance, +2 for safety/equipment General ADL Comments: Worked on neuromuscular re-education for the RUE with functional reach for drinking from cup with mod assist as well as picking up and placing wadded up pieces of paper from the chair to the trash can.  She demonstrates 90% of gross grasp and 80% of release.  Educated pt on self AAROM exercises for shoulder flexion and elbow flexion in the RUE as well.    Mobility  Overal bed mobility: Needs Assistance Bed Mobility: Rolling Rolling: Mod assist Sidelying to sit: Mod assist Supine to sit: Max assist General bed mobility comments: Pt required assistance to move to edge of bed and to elevate trunk in sitting presents with R sided Lateral lean/LOB.    Transfers  Overall transfer level: Needs assistance Equipment used: None Transfers: Sit to/from Stand, Stand Pivot Transfers Sit to Stand: Min assist Stand pivot transfers: Mod assist General transfer comment: Decreased ability to maintain support on the RLE with weightbearing as well as demonstrating decreased efficiency with active movement.    Ambulation / Gait / Stairs / Wheelchair Mobility   Ambulation/Gait Ambulation/Gait assistance: Mod assist, +2 safety/equipment Gait Distance (Feet): 65 Feet Assistive device: Right platform walker(youth height) Gait Pattern/deviations: Step-through pattern, Decreased step length - right, Decreased stance time - right, Ataxic, Scissoring General Gait Details: pt with R lateral lean requiring tactile cues for weight shift to the L with R LE advancement, Pt able to advance RLE forward unassisted but required max assistance to maintain path of RW and to manuever RW. Gait velocity: slow Gait velocity interpretation: 1.31 - 2.62 ft/sec, indicative of limited community ambulator    Posture /  Balance Dynamic Sitting Balance Sitting balance - Comments: Pt with initial LOB posteriorly when sitting EOC and working on self AAROM exercises Balance Overall balance assessment: Needs assistance Sitting-balance support: Feet unsupported, No upper extremity supported Sitting balance-Leahy Scale: Poor Sitting balance - Comments: Pt with initial LOB posteriorly when sitting EOC and working on self AAROM exercises Postural control: Right lateral lean Standing balance support: Bilateral upper extremity supported, During functional activity Standing balance-Leahy Scale: Poor Standing balance comment: Pt needs use of the therapist and handles of the chair and BSC for balance support during transfers.    Special needs/care consideration BiPAP/CPAP no CPM no Continuous Drip IV no Dialysis no        Days n/a Life Vest no Oxygen no Special Bed no Trach Size no Wound Vac (area) no      Location n/a Skin intact                              Location  Bowel mgmt: incontinent, no BM since admission Bladder mgmt: incontinent Diabetic mgmt: no Behavioral consideration no Chemo/radiation no Pt is a refugee from [place], has lived in the [place] for 12 years, and in a refugee camp in [place] for 12 years prior to that.    Previous Home Environment (from acute therapy  documentation) Living Arrangements: Spouse/significant other, Children Available Help at Discharge: Family, Available 24 hours/day Type of Home: House Home Layout: One level Home Access: Stairs to enter [employer] of Steps: 2 Bathroom Shower/Tub: Chiropodist: [place]: No  Discharge Living Setting Plans for Discharge Living Setting: Patient's home Type of Home at Discharge: House Discharge Home Layout: Multi-level, Bed/bath upstairs Alternate Level Stairs-Rails: Right Alternate Level Stairs-Number of Steps: 3 Discharge Home Access: Stairs to enter Entrance Stairs-Rails: None Entrance Stairs-Number of Steps: 1 Discharge Bathroom Shower/Tub: Tub/shower unit Discharge Bathroom Toilet: Standard Discharge Bathroom Accessibility: Yes How Accessible: Accessible via walker Does the patient have any problems obtaining your medications?: No  Social/Family/Support Systems Patient Roles: Parent Anticipated Caregiver: Klaw Schwarting (neice) is main contact who speaks the best English; caregivers will include her as well as pt's sister (No Jumper), son (Prey Res), husband (Taw Mera) Anticipated Caregiver's Contact Information: Charlynn Grimes [phone] Ability/Limitations of Caregiver: family aware pt will need 24/7 supervision at discharge Caregiver Availability: 24/7 Discharge Plan Discussed with Primary Caregiver: Yes Is Caregiver In Agreement with Plan?: Yes Does Caregiver/Family have Issues with Lodging/Transportation while Pt is in Rehab?: No  Goals/Additional Needs Patient/Family Goal for Rehab: PT/OT/SPT supervision Expected length of stay: 14-18 days Cultural Considerations: pt/family deny any needs Dietary Needs: d3/thin Special Service Needs: interpreter: [name] Pt/Family Agrees to Admission and willing to participate: Yes Program Orientation Provided & Reviewed with Pt/Caregiver Including Roles  & Responsibilities: Yes  Barriers to  Discharge: Insurance for SNF coverage  Decrease burden of Care through IP rehab admission: n/a  Possible need for SNF placement upon discharge: no  Patient Condition: I have reviewed medical records from [facility], spoken with CM, and patient and family member. I met with patient at the bedside for inpatient rehabilitation assessment.  Patient will benefit from ongoing PT, OT and SLP, can actively participate in 3 hours of therapy a day 5 days of the week, and can make measurable gains during the admission.  Patient will also benefit from the coordinated team approach during an Inpatient Acute Rehabilitation admission.  The patient will receive intensive therapy  as well as Rehabilitation physician, nursing, social worker, and care management interventions.  Due to bladder management, bowel management, safety, skin/wound care, disease management, medication administration, pain management and patient education the patient requires 24 hour a day rehabilitation nursing.  The patient is currently min to mod assist with mobility and basic ADLs.  Discharge setting and therapy post discharge home is anticipated.  Patient has agreed to participate in the Acute Inpatient Rehabilitation Program and will admit today.  Preadmission Screen Completed By:  Michel Santee, PT, DPT 10/21/2019 3:57 PM ______________________________________________________________________   Discussed status with Dr. Ranell Patrick on 10/21/19 at [facility]  and received approval for admission today.  Admission Coordinator:  Michel Santee, PT, DPT time 4:12 PM Sudie Grumbling 10/21/19    Assessment/Plan: Diagnosis: 1. Does the need for close, 24 hr/day Medical supervision in concert with the patient's rehab needs make it unreasonable for this patient to be served in a less intensive setting? Yes 2. Co-Morbidities requiring supervision/potential complications: HTN 3. Due to bowel management, safety, skin/wound care, disease management,  medication administration, pain management and patient education, does the patient require 24 hr/day rehab nursing? Yes 4. Does the patient require coordinated care of a physician, rehab nurse, PT, OT, and SLP to address physical and functional deficits in the context of the above medical diagnosis(es)? Yes Addressing deficits in the following areas: balance, endurance, locomotion, strength, transferring, bowel/bladder control, bathing, dressing, feeding, grooming, toileting, cognition, speech, language and psychosocial support 5. Can the patient actively participate in an intensive therapy program of at least 3 hrs of therapy 5 days a week? Yes 6. The potential for patient to make measurable gains while on inpatient rehab is excellent 7. Anticipated functional outcomes upon discharge from inpatient rehab: modified independent PT, modified independent OT, modified independent SLP 8. Estimated rehab length of stay to reach the above functional goals is: 2 weeks 9. Anticipated discharge destination: Home 10. Overall Rehab/Functional Prognosis: excellent   MD Signature: Leeroy Cha, MD

## 2019-10-21 NOTE — Progress Notes (Signed)
Occupational Therapy Treatment Patient Details Name: Lindsay Howard MRN: [id number] DOB: Jan 06, 1976 Today's Date: 10/21/2019    History of present illness Lindsay Howard is an 43 y.o. female with a PMHx only of HTN, not on any daily medication. Pt admitted after beign found moaning in bed and not verbally responsive. 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    OT comments  Worked on RUE neuromuscular re-education and toileting during session.  Pt with Brunnstrum stage III-IV movement in the RUE with mod assist needed for functional reach when drinking from a cup or when picking pieces of paper up to throw in the trash.  Min assist for dynamic sitting balance with mod assist for toilet transfer and toileting tasks.  Feel she will be an excellent inpatient rehab candidate for more intense therapy.  Will continue to follow in acute care to progress with ADL independence and RUE functional use.     Follow Up Recommendations  CIR;Supervision/Assistance - 24 hour    Equipment Recommendations  Other (comment)(TBD next venue of care)    Recommendations for Other Services Rehab consult    Precautions / Restrictions Precautions Precautions: Fall Precaution Comments: R hemiparesis Restrictions Weight Bearing Restrictions: No       Mobility Bed Mobility Overal bed mobility: Needs Assistance Bed Mobility: Rolling Rolling: Mod assist Sidelying to sit: Mod assist       General bed mobility comments: Pt required assistance to move to edge of bed and to elevate trunk in sitting presents with R sided Lateral lean/LOB.  Transfers Overall transfer level: Needs assistance Equipment used: None Transfers: Sit to/from [facility] Sit to Stand: Min assist Stand pivot transfers: Mod assist       General transfer comment: Decreased ability to maintain support on the RLE with weightbearing as well as demonstrating decreased efficiency with active  movement.    Balance Overall balance assessment: Needs assistance Sitting-balance support: Feet unsupported;No upper extremity supported Sitting balance-Leahy Scale: Poor Sitting balance - Comments: Pt with initial LOB posteriorly when sitting EOC and working on self AAROM exercises Postural control: Right lateral lean Standing balance support: Bilateral upper extremity supported;During functional activity Standing balance-Leahy Scale: Poor Standing balance comment: Pt needs use of the therapist and handles of the chair and BSC for balance support during transfers.                           ADL either performed or assessed with clinical judgement   ADL Overall ADL's : Needs assistance/impaired     Grooming: Wash/dry hands;Minimal assistance                   Toilet Transfer: Moderate assistance;BSC;Stand-pivot   Toileting- Clothing Manipulation and Hygiene: Moderate assistance;Sit to/from stand         General ADL Comments: Worked on neuromuscular re-education for the RUE with functional reach for drinking from cup with mod assist as well as picking up and placing wadded up pieces of paper from the chair to the trash can.  She demonstrates 90% of gross grasp and 80% of release.  Educated pt on self AAROM exercises for shoulder flexion and elbow flexion in the RUE as well.               Cognition Arousal/Alertness: Awake/alert Behavior During Therapy: Flat affect Overall Cognitive Status: No family/caregiver present to determine baseline cognitive functioning  Following Commands: (Pt needed mod demonstrational commands during functional reach with the RUE.)       General Comments: responds well to gesturing.  Did not use interpreter due to aphasia.                   Pertinent Vitals/ Pain       Pain Assessment: Faces Faces Pain Scale: No hurt         Frequency  Min 2X/week        Progress Toward  Goals  OT Goals(current goals can now be found in the care plan section)     Acute Rehab OT Goals Patient Stated Goal: not stated Potential to Achieve Goals: Good  Plan Discharge plan remains appropriate       AM-PAC OT "6 Clicks" Daily Activity     Outcome Measure   Help from another person eating meals?: A Little Help from another person taking care of personal grooming?: A Little Help from another person toileting, which includes using toliet, bedpan, or urinal?: A Lot Help from another person bathing (including washing, rinsing, drying)?: A Lot Help from another person to put on and taking off regular upper body clothing?: A Lot Help from another person to put on and taking off regular lower body clothing?: A Lot 6 Click Score: 14    End of Session    OT Visit Diagnosis: Unsteadiness on feet (R26.81);Muscle weakness (generalized) (M62.81);Hemiplegia and hemiparesis;Cognitive communication deficit (R41.841) Symptoms and signs involving cognitive functions: Cerebral infarction Hemiplegia - Right/Left: Right Hemiplegia - dominant/non-dominant: Dominant Hemiplegia - caused by: Cerebral infarction   Activity Tolerance Patient tolerated treatment well   Patient Left with chair alarm set;in chair   Nurse Communication Mobility status        Time: 9767-3419 OT Time Calculation (min): 38 min  Charges: OT General Charges $OT Visit: 1 Visit OT Treatments $Self Care/Home Management : 8-22 mins $Neuromuscular Re-education: 23-37 mins   Lindsay Howard OTR/L 10/21/2019, 3:21 PM

## 2019-10-21 NOTE — TOC Transition Note (Signed)
Transition of Care [facility]) - CM/SW Discharge Note   Patient Details  Name: Lindsay Howard MRN: [id number] Date of Birth: [birth year]  [facility] CM/SW Contact:  Pollie Friar, RN Phone Number: 10/21/2019, 4:02 PM   Clinical Narrative:    Pt is discharging to CIR today. CM signing off.    Final next level of care: IP Rehab Facility Barriers to Discharge: No Barriers Identified   Patient Goals and CMS Choice        Discharge Placement                       Discharge Plan and Services                                     Social Determinants of Health (SDOH) Interventions     Readmission Risk Interventions No flowsheet data found.

## 2019-10-21 NOTE — Consult Note (Addendum)
Physical Medicine and Rehabilitation Consult Follow-up Reason for Consult: Stroke with functional deficits Referring Physician: Dr. Cristal Deer   HPI: Lindsay Howard is a 43 year old female with history of HTN- no medications otherwise in good health who was admitted on 10/15/19 with inability to communicate and moaning incoherently after a nap that evening. UDS negative.  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 Follow up MRI brain showed stable left thalamic hemorrhage with mild progression of rim of edema. 2D echo showed EF of 60-65% with normal diastolic/systolic function and no wall abnormality. Bleed felt to be hypertensive in nature and Norvasc. NGT placed as patient NPO. MBS done 11/8 and she was started on dysphagia 3, thins as mild aspiration risk.     ROS: As indicated in HPI, 10 point review of systems otherwise negative. Limited by aphasia  Past Medical History:  Diagnosis Date  . Bulky or enlarged uterus    Past Surgical History:  Procedure Laterality Date  . CESAREAN SECTION     Family History  Problem Relation Age of Onset  . Epilepsy Sister    Social History:  reports that she has never smoked. She has never used smokeless tobacco. She reports previous alcohol use. She reports that she does not use drugs. Allergies: No Known Allergies No medications prior to admission.    Home: Home Living Family/patient expects to be discharged to:: Private residence Living Arrangements: Spouse/significant other, Children Available Help at Discharge: Family, Available 24 hours/day Type of Home: House Home Access: Stairs to enter [employer] of Steps: 2 Home Layout: One level Bathroom Shower/Tub: Chiropodist: Standard  Home Equipment: None  Functional History: Prior Function Level of Independence: Independent Comments: Per pt's son, pt was independent for all ADLs and IADLs Functional Status:  Mobility: Bed Mobility Overal bed mobility: Needs Assistance Bed Mobility: Rolling Rolling: Mod assist Sidelying to sit: Mod assist Supine to sit: Max assist General bed mobility comments: Pt required assistance to move to edge of bed for LE advancement and trunk elevation,  Pt with anterior lean intially but once feet planted on floor able to hold balance edge of bed unassisted. Transfers Overall transfer level: Needs assistance Equipment used: Right platform walker Transfers: Sit to/from Stand, Stand Pivot Transfers Sit to Stand: Min assist Stand pivot transfers: Mod assist, +2 safety/equipment General transfer comment: Pt performed multiple sit to stand trials and stand pivot from bed to bedside commode. Ambulation/Gait Ambulation/Gait assistance: Max assist, +2 safety/equipment Gait Distance (Feet): 50 Feet Assistive device: Right platform walker(youth height R platform RW.) Gait Pattern/deviations: Step-through pattern, Decreased step length - right, Decreased stance time - right, Ataxic, Scissoring General Gait Details: pt with R lateral lean requiring tactile cues for weight shift to the L with R LE advancement, Pt able to advance RLE forward unassisted but required max assistance to maintain path of RW and to manuever RW. Gait velocity: slow Gait velocity interpretation: 1.31 - 2.62 ft/sec, indicative of limited community ambulator    ADL: ADL Overall ADL's : Needs assistance/impaired Eating/Feeding: Minimal assistance, Sitting, Set up Grooming: Minimal assistance, Sitting Upper Body Bathing: Moderate assistance, Sitting Lower Body Bathing: Sit to/from stand, +2 for physical assistance, +2 for safety/equipment, Maximal assistance, Moderate assistance Lower  Body Bathing Details (indicate cue type  and reason): When cued to wash legs, pt required increased time and mod VCs to initiate task. Pt attempted figure 4 method for washing legs. Pt required mod A to wash feet and back of legs, and max A +2 for peri-care.  Upper Body Dressing : Sitting, Minimal assistance Lower Body Dressing: Maximal assistance, Sit to/from stand, +2 for physical assistance Lower Body Dressing Details (indicate cue type and reason): Pt required max A to don socks sitting on 3N1, required max A +2 to power up to stand Toilet Transfer: Maximal assistance, +2 for physical assistance, +2 for safety/equipment, Ambulation, BSC Toilet Transfer Details (indicate cue type and reason): When walking into bathroom, pt began to urinate on the floor. BSC placed behind the pt and pt required max A +2 to transfer up onto raised BSC.  Toileting- Clothing Manipulation and Hygiene: Maximal assistance, +2 for physical assistance Toileting - Clothing Manipulation Details (indicate cue type and reason): Pt required max A +2 for peri-hygiene Functional mobility during ADLs: Maximal assistance, +2 for physical assistance, +2 for safety/equipment General ADL Comments: Pt required min A for UB ADLs, mod A-max A +2 for LB ADLs, and max A +2 for functional mobility. Pt demonstrated difficulty moving RLE to walk, required assist to move foot forward  Cognition: Cognition Overall Cognitive Status: No family/caregiver present to determine baseline cognitive functioning Arousal/Alertness: Awake/alert Orientation Level: Oriented to person, Disoriented to situation, Disoriented to time, Disoriented to place Attention: Sustained Sustained Attention: Appears intact Memory: (TBA) Awareness: Impaired Awareness Impairment: Intellectual impairment, Emergent impairment, Anticipatory impairment Problem Solving: (TBA) Safety/Judgment: Impaired Cognition Arousal/Alertness: Awake/alert Behavior During Therapy: Flat affect Overall Cognitive Status: No  family/caregiver present to determine baseline cognitive functioning Area of Impairment: Following commands, Problem solving Current Attention Level: Sustained Following Commands: Follows one step commands with increased time(only follows demonstration due to receptive aphasia) Safety/Judgement: Decreased awareness of safety, Decreased awareness of deficits Awareness: Intellectual Problem Solving: Slow processing, Decreased initiation, Difficulty sequencing, Requires verbal cues, Requires tactile cues General Comments: pt with impaired receptive and expressive aphasia but followed simple visual cues accurately Difficult to assess due to: Non-English speaking, Impaired communication(recpetive aphasia)  Blood pressure (!) 98/59, pulse 68, temperature 97.6 F (36.4 C), temperature source Oral, resp. rate 16, height 4\' 11"  (1.499 m), weight 63.6 kg, SpO2 97 %. Physical Exam  General: No apparent distress HEENT: Head is normocephalic, atraumatic, PERRLA, EOMI, sclera anicteric, oral mucosa pink and moist, dentition intact, ext ear canals clear,  Neck: Supple without JVD or lymphadenopathy Heart: Reg rate and rhythm. No murmurs rubs or gallops Chest: CTA bilaterally without wheezes, rales, or rhonchi; no distress Abdomen: Soft, non-tender, non-distended, bowel sounds positive. Extremities: No clubbing, cyanosis, or edema. Pulses are 2+ Skin: Clean and intact without signs of breakdown Neuro: Eyes open and awake. Severely dysarthric with right facial droop; CN otherwise intact. Follows commands. RUE 0/5. All other extremities antigravity, testing limited due to language barrier.  Musculoskeletal: Full ROM, No pain with AROM or PROM in the neck, trunk, or extremities. Posture appropriate Psych: Dull, flat. Pt is cooperative  Results for orders placed or performed during the hospital encounter of 10/15/19 (from the past 24 hour(s))  CBC     Status: Abnormal   Collection Time: 10/21/19  3:15 AM   Result Value Ref Range   WBC 4.8 4.0 - 10.5 K/uL   RBC 4.81 3.87 - 5.11 MIL/uL   Hemoglobin 12.2 12.0 - 15.0 g/dL   HCT  37.3 36.0 - 46.0 %   MCV 77.5 (L) 80.0 - 100.0 fL   MCH 25.4 (L) 26.0 - 34.0 pg   MCHC 32.7 30.0 - 36.0 g/dL   RDW 61.4 43.1 - 54.0 %   Platelets 243 150 - 400 K/uL   nRBC 0.0 0.0 - 0.2 %  Basic metabolic panel     Status: Abnormal   Collection Time: 10/21/19  3:15 AM  Result Value Ref Range   Sodium 137 135 - 145 mmol/L   Potassium 3.4 (L) 3.5 - 5.1 mmol/L   Chloride 105 98 - 111 mmol/L   CO2 22 22 - 32 mmol/L   Glucose, Bld 94 70 - 99 mg/dL   BUN 9 6 - 20 mg/dL   Creatinine, Ser 0.86 0.44 - 1.00 mg/dL   Calcium 8.9 8.9 - 76.1 mg/dL   GFR calc non Af Amer >60 >60 mL/min   GFR calc Af Amer >60 >60 mL/min   Anion gap 10 5 - 15   No results found. Assessment/Plan: Lindsay Howard is a 43 year old female with history of HTN- no medications otherwise in good health who was admitted on 10/15/19 with inability to communicate and moaning incoherently after a nap that evening. 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 Follow up MRI brain showed stable left thalamic hemorrhage with mild progression of rim of edema.   --Mrs. Lindsay Howard would be an excellent candidate for acute rehabilitation. Her impairments include dysarthria, receptive and expressive aphasia, and significant right arm > leg weakness. She would be able to tolerate 3 hours of daily therapy. Currently MaxA x2 in ambulating 40 feet. --Upon discharge, patient will have 24/7 assistance from family. --Admission currently pending insurance authorization.    Godfrey Pick, MD

## 2019-10-21 NOTE — Progress Notes (Signed)
STROKE TEAM PROGRESS NOTE   INTERVAL HISTORY Her son who speaks English as well as patient's native language is at the bedside and translates for her.  She is awake andinteractive.  She is aphasic not able to speak but follows a few pantomime commands. . She is awaiting insurance approval for transfer to inpatient rehab Vitals:   10/20/19 2349 10/21/19 0353 10/21/19 0516 10/21/19 0741  BP: 100/69 99/64  (!) 98/59  Pulse: 63 66  68  Resp: 18 19  16   Temp: 98.2 F (36.8 C) 99 F (37.2 C)  97.6 F (36.4 C)  TempSrc: Oral Oral  Oral  SpO2: 97% 99%  97%  Weight:   63.6 kg   Height:        CBC:  Recent Labs  Lab 10/15/19 1907  10/20/19 0348 10/21/19 0315  WBC 4.9   < > 5.1 4.8  NEUTROABS 2.4  --   --   --   HGB 11.9*   < > 12.2 12.2  HCT 37.6   < > 37.3 37.3  MCV 79.7*   < > 78.2* 77.5*  PLT 239   < > 236 243   < > = values in this interval not displayed.    Basic Metabolic Panel:  Recent Labs  Lab 10/20/19 0348 10/21/19 0315  NA 136 137  K 3.6 3.4*  CL 106 105  CO2 20* 22  GLUCOSE 97 94  BUN 11 9  CREATININE 0.65 0.64  CALCIUM 8.9 8.9   Lipid Panel:     Component Value Date/Time   CHOL 177 10/17/2019 0432   TRIG 156 (H) 10/17/2019 0432   HDL 23 (L) 10/17/2019 0432   CHOLHDL 7.7 10/17/2019 0432   VLDL 31 10/17/2019 0432   LDLCALC 123 (H) 10/17/2019 0432   HgbA1c:  Lab Results  Component Value Date   HGBA1C 5.7 (H) 10/16/2019   Urine Drug Screen:     Component Value Date/Time   LABOPIA NONE DETECTED 10/16/2019 0608   COCAINSCRNUR NONE DETECTED 10/16/2019 0608   LABBENZ NONE DETECTED 10/16/2019 0608   AMPHETMU NONE DETECTED 10/16/2019 0608   THCU NONE DETECTED 10/16/2019 0608   LABBARB NONE DETECTED 10/16/2019 0608    Alcohol Level     Component Value Date/Time   ETH <10 10/15/2019 2122    IMAGING  Ct Angio Head W Or Wo Contrast Ct Angio Neck W And/or Wo Contrast 10/15/2019   CT HEAD  IMPRESSION:  2.7 cc acute intraparenchymal hemorrhage  centered at the left thalamus. Mild localized edema without significant regional mass effect. No midline shift or intraventricular extension.   CTA HEAD AND NECK  IMPRESSION:  1. Normal CTA of the head and neck. No large vessel occlusion or hemodynamically significant stenosis.  2. No vascular abnormality seen underlying the left thalamic hemorrhage.   Dg Abdomen 1 View 10/16/2019 IMPRESSION:  Nasogastric tube tip and side hole in the proximal stomach.  Dg Abdomen 1 View 10/16/2019 IMPRESSION:  No acute abnormality. The previously seen nasogastric tube is no longer visualized.   Ct Head Wo Contrast 10/16/2019 IMPRESSION:  1. No significant interval change in size of left thalamic intraparenchymal hemorrhage, with mildly increased surrounding vasogenic edema with trace localized left-to-right midline shift. No hydrocephalus or ventricular trapping. No intraventricular extension of hemorrhage.  2. No other new acute intracranial abnormality.   Mr Brain Wo Contrast 10/16/2019   ADDENDUM: Omitted impression #4 of generally low marrow signal, please correlate with CBC.  IMPRESSION:  1.  Left thalamic hematoma with size stability since earlier CTA. The rim of edema has progressed.  2. No evidence of underlying mass or primary infarct.  3. Minimal white matter disease.   Dg Chest Portable 1 View 10/15/2019 IMPRESSION:  1. No evidence of active cardiopulmonary disease.  2. UPPER limits normal heart size.   Dg Abd Portable 1v 10/16/2019 IMPRESSION:  NG tube tip projects over the gastric body    PHYSICAL EXAM  Temp:  [97.5 F (36.4 C)-99 F (37.2 C)] 97.6 F (36.4 C) (11/11 0741) Pulse Rate:  [63-70] 68 (11/11 0741) Resp:  [16-20] 16 (11/11 0741) BP: (98-114)/(59-69) 98/59 (11/11 0741) SpO2:  [97 %-99 %] 97 % (11/11 0741) Weight:  [63.6 kg] 63.6 kg (11/11 0516)  General -frail middle-aged Asian lady not in distress.  Lethargic.but eyes open and responsive  Ophthalmologic -  fundi not visualized due to noncooperation.  Cardiovascular - Regular rhythm and rate.  Neuro - alert awake,   following commands with visual cues, language barrier, difficult testing language function. Conjugate midline gaze tracks examiner across the room, PERRL, EOMI. Blinking to visual threat bilaterally. Right facial droop. Tongue protrusion not cooperative. Right UE flicker with pain, RLE 2/5 with pain. LUE and LLE spontaneous movement against gravity. DTR 1+ right upgoing toe. Sensation, coordination and gait not tested.  ASSESSMENT/PLAN [name] is a 43 y.o. female from [place] with history of HTN found not verbally responsive.   ICH:   L thalamic hemorrhage, likely hypertensive  CT head 2.7cm L thalamic hemorrhage   CTA head & neck Unremarkable   MRI - 10/16/19 - stable L thalamic hemorrhage   Repeat CT head 10/16/19 - No significant interval change - the rim of edema has progressed - generally low marrow signal, please correlate with CBC (low MCV and MCH - Hb normal)  2D Echo EF 60-65%  LDL - 123  HgbA1c 5.7  SCDs for VTE prophylaxis  No antithrombotic prior to admission, now on No antithrombotic given hemorrhage   Therapy recommendations: CLR  Disposition:  pending   Hypertension  Highest BP listed 154/90 in EMR  Home meds:  None listed . SBP goal < 140 . SBP 90s - 120s today  Off cleviprex now, will stop and give prns, consider increasing norvasc or adding oral agent (Family does not know what her home meds were)  On norvasc 5  . Long-term BP goal normotensive  Dysphagia . Secondary to stroke . Speech on board  Other Stroke Risk Factors  Smokeless tobacco user  Other Active Problems Plan    Mobilize out of bed.  Continue ongoing therapies.  Transfer to inpatient rehab when bed available in the next few days.  Continue strict blood pressure control with systolic BP goal below 160.  Discussed with patient and son and answered questions. Delia Heady  MD Stroke Neurology       10/21/2019 11:28 AM  To contact Stroke Continuity provider, please refer to [email or web address]. After hours, contact General Neurology

## 2019-10-21 NOTE — Progress Notes (Signed)
Physical Therapy Treatment Patient Details Name: Lindsay Howard MRN: [id number] DOB: 11-Apr-1976 Today's Date: 10/21/2019    History of Present Illness Lindsay Howard is an 43 y.o. female with a PMHx only of HTN, not on any daily medication. Pt admitted after beign found moaning in bed and not verbally responsive. 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     PT Comments    Pt performed gt training with R youth height platform RW.  Pt required cues for upper trunk control and assistance to correct oathway of RW.  She is moving R foot better but as she fatigues it is noted to shuffle more.  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  Pt continues to be an excellent candidate for aggressive rehab at [facility].    Follow Up Recommendations  [facility]     Equipment Recommendations  (TBD at next venue)    Recommendations for Other Services Rehab consult     Precautions / Restrictions Precautions Precautions: Fall Precaution Comments: R hemiparesis Restrictions Weight Bearing Restrictions: No    Mobility  Bed Mobility Overal bed mobility: Needs Assistance Bed Mobility: Rolling Rolling: Mod assist Sidelying to sit: Mod assist       General bed mobility comments: Pt required assistance to move to edge of bed and to elevate trunk in sitting presents with R sided Lateral lean/LOB.  Transfers Overall transfer level: Needs assistance Equipment used: Right platform walker(youth height.) Transfers: Sit to/from Stand;Stand Pivot Transfers Sit to Stand: Min assist;+2 safety/equipment         General transfer comment: Cues for hand placement to and from seated surface.  Pt required assistance to place RUE on R platform.  Ambulation/Gait Ambulation/Gait assistance: Mod assist;+2 safety/equipment Gait Distance (Feet): 65 Feet Assistive device: Right platform walker(youth height) Gait Pattern/deviations: Step-through pattern;Decreased step length -  right;Decreased stance time - right;Ataxic;Scissoring     General Gait Details: pt with R lateral lean requiring tactile cues for weight shift to the L with R LE advancement, Pt able to advance RLE forward unassisted but required max assistance to maintain path of RW and to manuever RW.   Stairs             Wheelchair Mobility    Modified Rankin (Stroke Patients Only)       Balance Overall balance assessment: Needs assistance Sitting-balance support: Feet unsupported;Single extremity supported Sitting balance-Leahy Scale: Fair Sitting balance - Comments: Pt able to maintain sitting balance EOB without support, but unable to tolerate weigth shift Postural control: Right lateral lean Standing balance support: Bilateral upper extremity supported;During functional activity Standing balance-Leahy Scale: Poor Standing balance comment: Pt required external assistance to maintain balance.                            Cognition Arousal/Alertness: Awake/alert Behavior During Therapy: Flat affect Overall Cognitive Status: Difficult to assess                                 General Comments: responds well to gesturing.  Did not use interpreter due to aphasia.      Exercises      General Comments        Pertinent Vitals/Pain Pain Assessment: Faces Faces Pain Scale: No hurt    Home Living  Prior Function            PT Goals (current goals can now be found in the care plan section) Acute Rehab PT Goals Patient Stated Goal: not stated Potential to Achieve Goals: Good Progress towards PT goals: Progressing toward goals    Frequency    Min 4X/week      PT Plan Current plan remains appropriate    Co-evaluation              AM-PAC PT "6 Clicks" Mobility   Outcome Measure  Help needed turning from your back to your side while in a flat bed without using bedrails?: A Lot Help needed moving from lying  on your back to sitting on the side of a flat bed without using bedrails?: A Lot Help needed moving to and from a bed to a chair (including a wheelchair)?: A Lot Help needed standing up from a chair using your arms (e.g., wheelchair or bedside chair)?: A Little Help needed to walk in hospital room?: A Lot Help needed climbing 3-5 steps with a railing? : A Lot 6 Click Score: 13    End of Session Equipment Utilized During Treatment: Gait belt Activity Tolerance: Patient tolerated treatment well Patient left: in chair;with call bell/phone within reach;with chair alarm set;with family/visitor present Nurse Communication: Mobility status PT Visit Diagnosis: Unsteadiness on feet (R26.81);Muscle weakness (generalized) (M62.81);Difficulty in walking, not elsewhere classified (R26.2);Other symptoms and signs involving the nervous system (R29.898);Hemiplegia and hemiparesis Hemiplegia - Right/Left: Right Hemiplegia - dominant/non-dominant: Dominant Hemiplegia - caused by: Other Nontraumatic intracranial hemorrhage     Time: 1202-1223 PT Time Calculation (min) (ACUTE ONLY): 21 min  Charges:  $Gait Training: 8-22 mins                     Bonney Leitz , PTA Acute Rehabilitation Services Pager [phone] Office [phone]     Nicholaus Steinke Artis Delay 10/21/2019, 2:51 PM

## 2019-10-21 NOTE — Progress Notes (Signed)
  Speech Language Pathology Treatment: Dysphagia  Patient Details Name: Lindsay Howard MRN: [id number] DOB: Nov 18, 1976 Today's Date: 10/21/2019 Time: 6759-1638 SLP Time Calculation (min) (ACUTE ONLY): 10 min  Assessment / Plan / Recommendation Clinical Impression  Pt seen for ongoing swallowing therapy.  Son was present this session and served as interpreter when needed.  Pt tolerated regular solid and thin liquid with no clinical s/s of aspiration. Pt fed herself regular solid finger food using appropriate pacing and bite size.  There was mild oral residue following solid on R side of oral cavity.  Pt is sensate to this and uses lingual sweep independently.  Pt also benefited from liquid wash to reduce oral residue.  Discussed pt diet preference for continue mechanical soft versus regular consistency.  Pt did not have preference.  Will upgrade to regular consistency solids.  Pt can choose softer food as desired.  Pt and son agreeable.  If pt has difficulty with regular texture diet, can downgrade to mechanical soft.   Recommend regular diet with thin liquid.  Briefly reviewed dysarthria strategies with family.  Son reports that pt does not seem to have difficulty finding words and uses appropriate words, but is harder to understand.     HPI HPI: Lindsay Howard is a 43 y.o. female with a PMHx only of HTN, not on any daily medication. Only prior surgical history is c-section.  Head CT on 11/5 revealed "Acute intraparenchymal hemorrhage centered at the left thalamus measures 2.4 x 1.7 x 1.3 cm (estimated volume 2.7 cc). Surrounding low-density vasogenic edema without significant regional mass effect. No significant midline shift. No intraventricular extension."      SLP Plan  Continue with current plan of care       Recommendations  Diet recommendations: Regular;Thin liquid Liquids provided via: Straw;Cup Medication Administration: Whole meds with puree Supervision: Staff to assist with self  feeding Compensations: Slow rate;Small sips/bites;Follow solids with liquid as needed to reduce oral residue. Postural Changes and/or Swallow Maneuvers: Seated upright 90 degrees                Oral Care Recommendations: Oral care BID Follow up Recommendations: Inpatient Rehab SLP Visit Diagnosis: Dysphagia, oropharyngeal phase (R13.12) Plan: Continue with current plan of care       [place], [place], [place] Office: [phone] 10/21/2019, 11:40 AM

## 2019-10-21 NOTE — Progress Notes (Signed)
Patient arrived to unit, no s/s of resp distress. Son at bedside for translation. No complications noted at this time.  Audie Clear, LPN

## 2019-10-21 NOTE — H&P (Signed)
Physical Medicine and Rehabilitation Admission H&P    Chief Complaint  Patient presents with  . Stroke with functional deficis   HPI:  Lindsay Howard is a 43 year old non-english speaking female from [place] with history of HTN- no medications otherwise in good health who was admitted on 10/15/19 with inability to communicate and moaning incoherently after a nap that evening.  She speaks Lindsay Howard (also called [name])  UDS negative.  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 Follow up MRI brain showed stable left thalamic hemorrhage with mild progression of rim of edema. 2D echo showed EF of 60-65% with normal diastolic/systolic function and no wall abnormality. Bleed felt to be hypertensive in nature and Norvasc. NGT placed as patient NPO. MBS done 11/8 and she was started on dysphagia 3, thins as mild aspiration risk. She continues to have expressive aphasia with left lean, right sided weakness.  Her son is at bedside today and helps translate. She denies pain, constipation. Son reports that she is having some difficulty understanding him and unclear speech, but he is mostly able to understand her.       Used pacific interpreter.  Review of Systems  Unable to perform ROS: Other  Respiratory: Positive for cough (dry cough noted).   Gastrointestinal: Positive for constipation.  Neurological: Positive for focal weakness (RUE weakness) and weakness (legs feel heavy).   10 point review of systems performed and is negative except as indicated in HPI   Past Medical History:  Diagnosis Date  . Bulky or enlarged uterus     Past Surgical History:  Procedure Laterality Date  . CESAREAN SECTION  2009, 2004  . CESAREAN SECTION       Family History  Problem Relation Age of Onset   . Epilepsy Sister       Social History:  Married. Independent PTA. She does not use tobacco or alcohol.    Allergies: No Known Allergies    Medications Prior to Admission  Medication Sig Dispense Refill  .  stroke: mapping our early stages of recovery book MISC 1 each by Does not apply route once for 1 dose.    Derrill Memo ON 10/22/2019] amLODipine (NORVASC) 10 MG tablet Take 1 tablet (10 mg total) by mouth daily.    . feeding supplement, ENSURE ENLIVE, (ENSURE ENLIVE) LIQD Take 237 mLs by mouth 2 (two) times daily between meals. 237 mL 12  . pantoprazole (PROTONIX) 40 MG tablet Take 1 tablet (40 mg total) by mouth at bedtime.    . senna-docusate (SENOKOT-S) 8.6-50 MG tablet Take 1 tablet by mouth 2 (two) times daily.      Drug Regimen Review  Drug regimen was reviewed and remains appropriate with no significant issues identified   Home: Home Living Family/patient expects to be discharged to:: Private residence Living Arrangements: Spouse/significant other, Children Available Help at Discharge: Family, Available 24 hours/day Type of Home: House Home Access: Stairs to enter [employer] of Steps: 2 Home Layout: One level Bathroom Shower/Tub: Chiropodist: Standard Home Equipment: None   Functional History: Prior Function Level of Independence: Independent Comments: Per pt's son, pt was independent for all ADLs and IADLs  Functional Status:  Mobility: Bed Mobility Overal bed mobility: Needs Assistance Bed  Mobility: Sidelying to Sit Sidelying to sit: Max assist Supine to sit: Max assist General bed mobility comments: pt in R sidelying, pt initiated pushing self up however due to R UE weakness pt required maxA for trunk elevation Transfers Overall transfer level: Needs assistance Equipment used: 1 person hand held assist Transfers: Sit to/from Stand, Stand Pivot Transfers Sit to Stand: Max assist Stand pivot transfers: Max assist General  transfer comment: pt completed std pvt toward the left to [facility], maxA to initiated R LE stepping, tactile cues to straigthen R LE Ambulation/Gait Ambulation/Gait assistance: Max assist, +2 physical assistance, +2 safety/equipment Gait Distance (Feet): 10 Feet(x2, to/from bathroom) Assistive device: 2 person hand held assist Gait Pattern/deviations: Step-through pattern, Decreased step length - right, Decreased stance time - right, Ataxic, Scissoring General Gait Details: Pt requiring modA for advancement of R LE due to impaired sequencing and ataxic/scissoring like movement. Pt with noted R LE impaired co-ordination and inability to use R UE to stabilize due to impaired grip and decreased strength, pt with R lateral lean requiring maxA to weight shift toward the left  Gait velocity: slow  ADL: ADL Overall ADL's : Needs assistance/impaired Eating/Feeding: Minimal assistance, Sitting, Set up Grooming: Minimal assistance, Sitting Upper Body Bathing: Moderate assistance, Sitting Lower Body Bathing: Sit to/from stand, +2 for physical assistance, +2 for safety/equipment, Maximal assistance, Moderate assistance Lower Body Bathing Details (indicate cue type and reason): When cued to wash legs, pt required increased time and mod VCs to initiate task. Pt attempted figure 4 method for washing legs. Pt required mod A to wash feet and back of legs, and max A +2 for peri-care.  Upper Body Dressing : Sitting, Minimal assistance Lower Body Dressing: Maximal assistance, Sit to/from stand, +2 for physical assistance Lower Body Dressing Details (indicate cue type and reason): Pt required max A to don socks sitting on 3N1, required max A +2 to power up to stand Toilet Transfer: Maximal assistance, +2 for physical assistance, +2 for safety/equipment, Ambulation, BSC Toilet Transfer Details (indicate cue type and reason): When walking into bathroom, pt began to urinate on the floor. BSC placed behind the pt and pt  required max A +2 to transfer up onto raised BSC.  Toileting- Clothing Manipulation and Hygiene: Maximal assistance, +2 for physical assistance Toileting - Clothing Manipulation Details (indicate cue type and reason): Pt required max A +2 for peri-hygiene Functional mobility during ADLs: Maximal assistance, +2 for physical assistance, +2 for safety/equipment General ADL Comments: Pt required min A for UB ADLs, mod A-max A +2 for LB ADLs, and max A +2 for functional mobility. Pt demonstrated difficulty moving RLE to walk, required assist to move foot forward  Cognition: Cognition Overall Cognitive Status: Impaired/Different from baseline Orientation Level: Oriented to person Cognition Arousal/Alertness: Awake/alert Behavior During Therapy: Flat affect Overall Cognitive Status: Impaired/Different from baseline Area of Impairment: Following commands, Problem solving Current Attention Level: Sustained Following Commands: Follows one step commands with increased time(must do demonstration due to global aphasia) Safety/Judgement: Decreased awareness of safety, Decreased awareness of deficits Awareness: Intellectual(had episode of urinary incontinence and didn't know) Problem Solving: Slow processing, Decreased initiation, Difficulty sequencing, Requires verbal cues, Requires tactile cues General Comments: pt with impaired receptive and expressive aphasia but followed simple visual cues accurately Difficult to assess due to: Non-English speaking    Physical Exam: Blood pressure 122/83, pulse 74, temperature 98.5 F (36.9 C), temperature source Oral, resp. rate 18, height 4\' 11"  (1.499 m), weight 61.9 kg, SpO2 99 %. Physical  Exam  Nursing note and vitals reviewed. Constitutional: She appears well-developed and well-nourished.  Neurological: She is alert.  Soft voice -- occassionally able to speak louder. Oriented to self and knew that it was the 11th month, thought that she was in a hospital  or clinic with cues. Unable to state age, DOB or country. She was able to follow simple motor commands. RUE weakness noted.   Eyes open and awake. Severely dysarthric with right facial droop; CN otherwise intact. Follows commands. RUE 1/5. All other extremities antigravity, testing limited due to language barrier. General: No apparent distress HEENT:Head is normocephalic, atraumatic, PERRLA, EOMI, sclera anicteric, oral mucosa pink and moist, dentition intact, ext ear canals clear,  Neck:Supple without JVD or lymphadenopathy Heart:Reg rate and rhythm. No murmurs rubs or gallops Chest:CTA bilaterally without wheezes, rales, or rhonchi; no distress Abdomen:Soft, non-tender, non-distended, bowel sounds positive. Extremities:No clubbing, cyanosis, or edema. Pulses are 2+ Skin:Clean and intact without signs of breakdown Psych:Dull, flat.Pt is cooperative  Results for orders placed or performed during the hospital encounter of 10/15/19 (from the past 48 hour(s))  CBC     Status: Abnormal   Collection Time: 10/20/19  3:48 AM  Result Value Ref Range   WBC 5.1 4.0 - 10.5 K/uL   RBC 4.77 3.87 - 5.11 MIL/uL   Hemoglobin 12.2 12.0 - 15.0 g/dL   HCT 16.1 09.6 - 04.5 %   MCV 78.2 (L) 80.0 - 100.0 fL   MCH 25.6 (L) 26.0 - 34.0 pg   MCHC 32.7 30.0 - 36.0 g/dL   RDW 40.9 81.1 - 91.4 %   Platelets 236 150 - 400 K/uL   nRBC 0.0 0.0 - 0.2 %    Comment: Performed at [facility], [address], [place], [place] [address]  Basic metabolic panel     Status: Abnormal   Collection Time: 10/20/19  3:48 AM  Result Value Ref Range   Sodium 136 135 - 145 mmol/L   Potassium 3.6 3.5 - 5.1 mmol/L   Chloride 106 98 - 111 mmol/L   CO2 20 (L) 22 - 32 mmol/L   Glucose, Bld 97 70 - 99 mg/dL   BUN 11 6 - 20 mg/dL   Creatinine, Ser 6.21 0.44 - 1.00 mg/dL   Calcium 8.9 8.9 - 30.8 mg/dL   GFR calc non Af Amer >60 >60 mL/min   GFR calc Af Amer >60 >60 mL/min   Anion gap 10 5 - 15    Comment:  Performed at [facility], [address] [place], [place] [address]  CBC     Status: Abnormal   Collection Time: 10/21/19  3:15 AM  Result Value Ref Range   WBC 4.8 4.0 - 10.5 K/uL   RBC 4.81 3.87 - 5.11 MIL/uL   Hemoglobin 12.2 12.0 - 15.0 g/dL   HCT 69.6 29.5 - 28.4 %   MCV 77.5 (L) 80.0 - 100.0 fL   MCH 25.4 (L) 26.0 - 34.0 pg   MCHC 32.7 30.0 - 36.0 g/dL   RDW 13.2 44.0 - 10.2 %   Platelets 243 150 - 400 K/uL   nRBC 0.0 0.0 - 0.2 %    Comment: Performed at [facility], [address] [place], [place] [address]  Basic metabolic panel     Status: Abnormal   Collection Time: 10/21/19  3:15 AM  Result Value Ref Range   Sodium 137 135 - 145 mmol/L   Potassium 3.4 (L) 3.5 -  5.1 mmol/L   Chloride 105 98 - 111 mmol/L   CO2 22 22 - 32 mmol/L   Glucose, Bld 94 70 - 99 mg/dL   BUN 9 6 - 20 mg/dL   Creatinine, Ser 1.610.64 0.44 - 1.00 mg/dL   Calcium 8.9 8.9 - 09.610.3 mg/dL   GFR calc non Af Amer >60 >60 mL/min   GFR calc Af Amer >60 >60 mL/min   Anion gap 10 5 - 15    Comment: Performed at [facility], [address] [place], [place] [id number]   No results found.  Medical Problem List and Plan: 1.  Impaired mobility and ADLs secondary to left thalamic hemorrhage  Impairments include expressive aphasia, R sided hemiplegia worse in the upper extremity, R LE ataxia, impaired coordination, impaired motor planning, currently ambulating only with MaxAx2.  2.  Antithrombotics: -DVT/anticoagulation:  Pharmaceutical: Lovenox  -antiplatelet therapy: N/A 3. Pain Management: N/A 4. Mood: LCSW to follow for evaluation and support as able   -antipsychotic agents: N/a 5. Neuropsych: This patient is not capable of making decisions on her own behalf. 6. Skin/Wound Care: Routine pressure relief measures.  7. Fluids/Electrolytes/Nutrition: Intake poor--will ask family to provide meals as able.  8. Hypertension: Patient will benefit from hypertension management education as she was  not on any medication prior to this admission. Continue Norvasc 5mg  QD. Goal SBP <140. 9. Constipation: On Senna-Docusate BID. Encourage eating of meals.  10. Disposition: Will require follow-up with PCP and Physiatry upon discharge.  11. Education: Will require education regarding lowering her risk of HTN and stroke.   I have personally performed a face to face diagnostic evaluation, including, but not limited to relevant history and physical exam findings, of this patient and developed relevant assessment and plan.  Additionally, I have reviewed and concur with the physician assistant's documentation above.  The patient's status has not changed. The original post admission physician evaluation remains appropriate, and any changes from the pre-admission screening or documentation from the acute chart are noted above.   Sula SodaKrutika Lannie Yusuf, MD

## 2019-10-21 NOTE — Discharge Summary (Signed)
. Physician Discharge Summary  Lindsay Howard ZOX:[id number] DOB: 09/20/1976 DOA: 10/15/2019  PCP: Patient, No Pcp Per  Admit date: 10/15/2019 Discharge date: 10/21/2019  Admitted From: Home Disposition:  Discharged to CIR  Recommendations for Outpatient Follow-up:  1. Follow up with PCP in 1-2 weeks 2. Please obtain BMP/CBC in one week  Discharge Condition: Stable  CODE STATUS: FULL   Brief/Interim Summary: [name] is a 43 year old female with history of hypertension not on medication but otherwise in good health until October 15, 2019 when she was admitted with inability to communicate and only moaning incoherently she was found to have a stroke by MRI of the brain that showed stable left thalamic hemorrhage with mild progression of rim of edema.  She has been started on antihypertensive with Norvasc she was initially on NG tube with n.p.o. but now she is able to feed.  11/11: No acute events ON. Denies complaints this AM. Interpretation through son at bedside.  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     [facility]     - MRI Brain 11/6 and repeat CTH: stable     - TTE: EF 65%     - A1c 5.7     - LDL 123: statin     - PT/OT rec  HTN     - goal SBP < 140     - amlodipine 5mg  qday  Dysphagia     - per SLP:          - Recommendations              - Diet recommendations: Regular;Thin liquid             - Liquids provided via: Straw;Cup             - Medication Administration: Whole meds with puree             - Supervision: Staff to assist with self feeding             - Compensations: Slow rate;Small sips/bites;Follow solids with liquid as needed to reduce oral residue.             - Postural Changes and/or Swallow Maneuvers: Seated upright 90 degrees  Hypokalemia     - add PO K+  Microcytosis     - follow up iron level outpt  Discharge  Instructions   Allergies as of 10/21/2019   No Known Allergies     Medication List    TAKE these medications    stroke: mapping our early stages of recovery book Misc 1 each by Does not apply route once for 1 dose.   amLODipine 10 MG tablet Commonly known as: NORVASC Take 1 tablet (10 mg total) by mouth daily. Start taking on: October 22, 2019   feeding supplement (ENSURE ENLIVE) Liqd Take 237 mLs by mouth 2 (two) times daily between meals.   pantoprazole 40 MG tablet Commonly known as: PROTONIX Take 1 tablet (40 mg total) by mouth at bedtime.   senna-docusate 8.6-50 MG tablet Commonly known as: Senokot-S Take 1 tablet by mouth 2 (two) times daily.       No Known Allergies  Consultations:  Neurology   Procedures/Studies: Ct Angio Head W Or Wo Contrast  Result Date: 10/15/2019 CLINICAL DATA:  Whether you guys doing EXAM: CT ANGIOGRAPHY HEAD AND NECK TECHNIQUE: Multidetector CT  imaging of the head and neck was performed using the standard protocol during bolus administration of intravenous contrast. Multiplanar CT image reconstructions and MIPs were obtained to evaluate the vascular anatomy. Carotid stenosis measurements (when applicable) are obtained utilizing NASCET criteria, using the distal internal carotid diameter as the denominator. CONTRAST:  OMNIPAQUE IOHEXOL 350 MG/ML SOLN COMPARISON:  None. FINDINGS: CT HEAD FINDINGS Brain: Acute intraparenchymal hemorrhage centered at the left thalamus measures 2.4 x 1.7 x 1.3 cm (estimated volume 2.7 cc). Surrounding low-density vasogenic edema without significant regional mass effect. No significant midline shift. No intraventricular extension. Remainder the brain is normal in appearance. No other acute intracranial hemorrhage. No acute large vessel territory infarct. No mass lesion. No extra-axial fluid collection. Vascular: No hyperdense vessel. Skull: Scalp soft tissues and calvarium within normal limits. Sinuses:  Mild-to-moderate mucosal thickening within the ethmoidal air cells, sphenoid sinuses, and maxillary sinuses. Mastoid air cells are clear. Orbits: Globes and orbital soft tissues demonstrate no acute finding. Review of the MIP images confirms the above findings CTA NECK FINDINGS Aortic arch: Visualized aortic arch of normal caliber with normal 3 vessel morphology. No hemodynamically significant stenosis about the origin of the great vessels. Visualized subclavian arteries widely patent. Right carotid system: Right common and internal carotid arteries are widely patent without stenosis, dissection, or occlusion. Right ICA medialized into the retropharyngeal space. Left carotid system: Left common and internal carotid arteries widely patent without stenosis, dissection or occlusion. Left ICA medialized into the retropharyngeal space. Vertebral arteries: Both vertebral arteries arise from the subclavian arteries. Vertebral arteries widely patent within the neck without stenosis, dissection or occlusion. Skeleton: No acute osseous abnormality. No discrete lytic or blastic osseous lesions. Other neck: No other acute soft tissue abnormality within the neck. Upper chest: Visualized upper chest demonstrates no acute finding. Review of the MIP images confirms the above findings CTA HEAD FINDINGS Anterior circulation: Internal carotid arteries widely patent to the termini without stenosis or other abnormality. A1 segments, anterior communicating artery common anterior cerebral arteries widely patent. Both ACAs well perfused and widely patent to their distal aspects. No M1 stenosis or occlusion. Normal MCA bifurcations. Distal MCA branches well perfused and symmetric. Posterior circulation: Both vertebral arteries patent to the vertebrobasilar junction without stenosis. Right vertebral artery dominant. Posterior inferior cerebral arteries not well seen. Basilar widely patent to its distal aspect without stenosis. Superior  cerebral arteries patent bilaterally. Both PCAs well perfused and widely patent to their distal aspects. Venous sinuses: Patent. Anatomic variants: None significant. No vascular abnormality seen underlying the left thalamic hemorrhage. Review of the MIP images confirms the above findings IMPRESSION: CT HEAD IMPRESSION: 2.7 cc acute intraparenchymal hemorrhage centered at the left thalamus. Mild localized edema without significant regional mass effect. No midline shift or intraventricular extension. CTA HEAD AND NECK IMPRESSION: 1. Normal CTA of the head and neck. No large vessel occlusion or hemodynamically significant stenosis. 2. No vascular abnormality seen underlying the left thalamic hemorrhage. Critical Value/emergent results were called by telephone at the time of interpretation on 10/15/2019 at 9:10 pm to [facility] , who verbally acknowledged these results. Electronically Signed   By: Rise Mu M.D.   On: 10/15/2019 21:34   Dg Abd 1 View  Result Date: 10/17/2019 CLINICAL DATA:  NG tube placement EXAM: ABDOMEN - 1 VIEW COMPARISON:  Abdominal radiograph 10/11/2019 FINDINGS: Enteric tube tip and side-port project over the stomach. Monitoring leads overlie the patient. Gas is demonstrated within nondilated loops of bowel within the abdomen.  IMPRESSION: Enteric tube tip and side-port project over the stomach. Electronically Signed   By: Annia Belt M.D.   On: 10/17/2019 13:21   Dg Abdomen 1 View  Result Date: 10/16/2019 CLINICAL DATA:  Nasogastric tube placement for the patient pulled out her nasogastric tube earlier today. EXAM: ABDOMEN - 1 VIEW COMPARISON:  Earlier today. FINDINGS: Interval nasogastric tube with its tip and side hole in the proximal stomach. Normal bowel gas pattern. Lumbar and lower thoracic spine degenerative changes. Excreted contrast in the urinary bladder. IMPRESSION: Nasogastric tube tip and side hole in the proximal stomach. Electronically Signed   By:  Beckie Salts M.D.   On: 10/16/2019 11:41   Dg Abdomen 1 View  Result Date: 10/16/2019 CLINICAL DATA:  Nasogastric tube placement. EXAM: ABDOMEN - 1 VIEW COMPARISON:  Earlier today. FINDINGS: The previously demonstrated nasogastric tube is no longer visualized. Normal bowel gas pattern. Lumbar and lower thoracic spine degenerative changes. IMPRESSION: No acute abnormality. The previously seen nasogastric tube is no longer visualized. Electronically Signed   By: Beckie Salts M.D.   On: 10/16/2019 11:40   Ct Head Wo Contrast  Result Date: 10/16/2019 CLINICAL DATA:  Follow-up exam for intracranial hemorrhage. EXAM: CT HEAD WITHOUT CONTRAST TECHNIQUE: Contiguous axial images were obtained from the base of the skull through the vertex without intravenous contrast. COMPARISON:  Prior CT from 10/15/2019. FINDINGS: Brain: Intraparenchymal hemorrhage centered at the left thalamus again seen, measuring 2.9 x 1.2 x 1.4 cm (volume 2.5 cc, previously 2.7 cc). Surrounding low-density vasogenic edema has mildly increased. Trace localized left-to-right midline shift similar to previous. No hydrocephalus or ventricular trapping. No intraventricular extension. Remainder the brain is stable and normal in appearance. No other acute intracranial hemorrhage. No acute large vessel territory infarct. No mass lesion. No extra-axial fluid collection. Vascular: No hyperdense vessel. Skull: Scalp soft tissues and calvarium within normal limits. Sinuses/Orbits: Globes and orbital soft tissues demonstrate no acute finding. Mild scattered mucosal thickening noted within the ethmoidal air cells and sphenoid sinuses. No mastoid effusion. Other: None. IMPRESSION: 1. No significant interval change in size of left thalamic intraparenchymal hemorrhage, with mildly increased surrounding vasogenic edema with trace localized left-to-right midline shift. No hydrocephalus or ventricular trapping. No intraventricular extension of hemorrhage. 2. No  other new acute intracranial abnormality. Electronically Signed   By: Rise Mu M.D.   On: 10/16/2019 21:52   Ct Angio Neck W And/or Wo Contrast  Result Date: 10/15/2019 CLINICAL DATA:  Whether you guys doing EXAM: CT ANGIOGRAPHY HEAD AND NECK TECHNIQUE: Multidetector CT imaging of the head and neck was performed using the standard protocol during bolus administration of intravenous contrast. Multiplanar CT image reconstructions and MIPs were obtained to evaluate the vascular anatomy. Carotid stenosis measurements (when applicable) are obtained utilizing NASCET criteria, using the distal internal carotid diameter as the denominator. CONTRAST:  OMNIPAQUE IOHEXOL 350 MG/ML SOLN COMPARISON:  None. FINDINGS: CT HEAD FINDINGS Brain: Acute intraparenchymal hemorrhage centered at the left thalamus measures 2.4 x 1.7 x 1.3 cm (estimated volume 2.7 cc). Surrounding low-density vasogenic edema without significant regional mass effect. No significant midline shift. No intraventricular extension. Remainder the brain is normal in appearance. No other acute intracranial hemorrhage. No acute large vessel territory infarct. No mass lesion. No extra-axial fluid collection. Vascular: No hyperdense vessel. Skull: Scalp soft tissues and calvarium within normal limits. Sinuses: Mild-to-moderate mucosal thickening within the ethmoidal air cells, sphenoid sinuses, and maxillary sinuses. Mastoid air cells are clear. Orbits: Globes and orbital soft  tissues demonstrate no acute finding. Review of the MIP images confirms the above findings CTA NECK FINDINGS Aortic arch: Visualized aortic arch of normal caliber with normal 3 vessel morphology. No hemodynamically significant stenosis about the origin of the great vessels. Visualized subclavian arteries widely patent. Right carotid system: Right common and internal carotid arteries are widely patent without stenosis, dissection, or occlusion. Right ICA medialized into the  retropharyngeal space. Left carotid system: Left common and internal carotid arteries widely patent without stenosis, dissection or occlusion. Left ICA medialized into the retropharyngeal space. Vertebral arteries: Both vertebral arteries arise from the subclavian arteries. Vertebral arteries widely patent within the neck without stenosis, dissection or occlusion. Skeleton: No acute osseous abnormality. No discrete lytic or blastic osseous lesions. Other neck: No other acute soft tissue abnormality within the neck. Upper chest: Visualized upper chest demonstrates no acute finding. Review of the MIP images confirms the above findings CTA HEAD FINDINGS Anterior circulation: Internal carotid arteries widely patent to the termini without stenosis or other abnormality. A1 segments, anterior communicating artery common anterior cerebral arteries widely patent. Both ACAs well perfused and widely patent to their distal aspects. No M1 stenosis or occlusion. Normal MCA bifurcations. Distal MCA branches well perfused and symmetric. Posterior circulation: Both vertebral arteries patent to the vertebrobasilar junction without stenosis. Right vertebral artery dominant. Posterior inferior cerebral arteries not well seen. Basilar widely patent to its distal aspect without stenosis. Superior cerebral arteries patent bilaterally. Both PCAs well perfused and widely patent to their distal aspects. Venous sinuses: Patent. Anatomic variants: None significant. No vascular abnormality seen underlying the left thalamic hemorrhage. Review of the MIP images confirms the above findings IMPRESSION: CT HEAD IMPRESSION: 2.7 cc acute intraparenchymal hemorrhage centered at the left thalamus. Mild localized edema without significant regional mass effect. No midline shift or intraventricular extension. CTA HEAD AND NECK IMPRESSION: 1. Normal CTA of the head and neck. No large vessel occlusion or hemodynamically significant stenosis. 2. No vascular  abnormality seen underlying the left thalamic hemorrhage. Critical Value/emergent results were called by telephone at the time of interpretation on 10/15/2019 at 9:10 pm to [facility] , who verbally acknowledged these results. Electronically Signed   By: Rise Mu M.D.   On: 10/15/2019 21:34   Mr Brain Wo Contrast  Addendum Date: 10/16/2019   ADDENDUM REPORT: 10/16/2019 06:34 ADDENDUM: Omitted impression #4 of generally low marrow signal, please correlate with CBC. Electronically Signed   By: Marnee Spring M.D.   On: 10/16/2019 06:34   Result Date: 10/16/2019 CLINICAL DATA:  Intracranial hemorrhage EXAM: MRI HEAD WITHOUT CONTRAST TECHNIQUE: Multiplanar, multiecho pulse sequences of the brain and surrounding structures were obtained without intravenous contrast. COMPARISON:  CTA head neck from yesterday FINDINGS: Brain: When measured on T1 and T2 weighted imaging there is a size stable left thalamic hematoma extending towards the upper midbrain which measures 14 x 22 mm on axial slices. Signal characteristics match acute timing. There is a rim of edema which may have increased. This is a noncontrast study; no evidence of underlying mass. No vascular malformation seen on prior study. No evidence of intraventricular extension or hydrocephalus. Diffusion signal is attributed to susceptibility artifact-no definite underlying infarct. Remote microhemorrhage in the right cerebral white matter. Vascular: Normal flow voids Skull and upper cervical spine: Diffusely low marrow signal Sinuses/Orbits: Negative IMPRESSION: 1. Left thalamic hematoma with size stability since earlier CTA. The rim of edema has progressed. 2. No evidence of underlying mass or primary infarct. 3. Minimal white matter  disease. Electronically Signed: By: Monte Fantasia M.D. On: 10/16/2019 04:36   Dg Chest Portable 1 View  Result Date: 10/15/2019 CLINICAL DATA:  Altered mental status EXAM: PORTABLE CHEST 1 VIEW  COMPARISON:  None. FINDINGS: UPPER limits normal heart size noted. There is no evidence of focal airspace disease, pulmonary edema, suspicious pulmonary nodule/mass, pleural effusion, or pneumothorax. No acute bony abnormalities are identified. IMPRESSION: 1. No evidence of active cardiopulmonary disease. 2. UPPER limits normal heart size. Electronically Signed   By: Margarette Canada M.D.   On: 10/15/2019 19:56   Dg Abd Portable 1v  Result Date: 10/16/2019 CLINICAL DATA:  NG tube placement EXAM: PORTABLE ABDOMEN - 1 VIEW COMPARISON:  None. FINDINGS: The NG tube projects over the gastric body. The tip is pointed distally. The visualized portions of the bowel gas are unremarkable. IMPRESSION: NG tube tip projects over the gastric body Electronically Signed   By: Constance Holster M.D.   On: 10/16/2019 06:33   Dg Swallowing Func-speech Pathology  Result Date: 10/18/2019 Objective Swallowing Evaluation: Type of Study: MBS-Modified Barium Swallow Study  Patient Details Name: Niomie Englert MRN: [id number] Date of Birth: 1976/03/22 Today's Date: 10/18/2019 Time: SLP Start Time (ACUTE ONLY): 0950 -SLP Stop Time (ACUTE ONLY): 1015 SLP Time Calculation (min) (ACUTE ONLY): 25 min Past Medical History: No past medical history on file. Past Surgical History: The histories are not reviewed yet. Please review them in the "History" navigator section and refresh this [place]. HPI: Kinisha Soper is a 43 y.o. female with a PMHx only of HTN, not on any daily medication. Only prior surgical history is c-section.  Head CT on 11/5 revealed "Acute intraparenchymal hemorrhage centered at the left thalamus measures 2.4 x 1.7 x 1.3 cm (estimated volume 2.7 cc). Surrounding low-density vasogenic edema without significant regional mass effect. No significant midline shift. No intraventricular extension."  Subjective: Pt was alert and cooperative Assessment / Plan / Recommendation CHL IP CLINICAL IMPRESSIONS 10/18/2019 Clinical Impression Pt presents with  mild oropharyngeal dysphagia c/b transient laryngeal penetration with thin liquid secondary to delayed swallow initiation and reduced laryngeal closure.  No aspiration was observed with any trials and no laryngeal penetration was observed with nectar-thick liquid, puree, or regular solids.  Oral phase was remarkable for prolonged mastication of regular solids and decreased lingual strength resulting in trace oral residue during solid trial.  Additionally, pt was observed to have piecemeal deglutition during regular solid trial.  Pharyngeal phase was remarkable for reduced laryngeal closure resulting in laryngeal penetration and delayed swallow initiation at the level of the pyriforms with all liquid trials.  Pharyngoesophageal phase was unremarkable.  Recommend diet change to Dysphagia 3 (mech soft) solids and thin liquid with the following precautions: 1) Small bites/sips 2) Slow rate of intake 3) Sit upright 90 degrees.  Pt is at a mild risk for intermittent aspiration with thin liquid; however, use of compensatory strategies should decrease risk.  SLP Visit Diagnosis Dysphagia, oropharyngeal phase (R13.12) Attention and concentration deficit following -- Frontal lobe and executive function deficit following -- Impact on safety and function Mild aspiration risk   CHL IP TREATMENT RECOMMENDATION 10/18/2019 Treatment Recommendations Therapy as outlined in treatment plan below   Prognosis 10/18/2019 Prognosis for Safe Diet Advancement Good Barriers to Reach Goals -- Barriers/Prognosis Comment -- CHL IP DIET RECOMMENDATION 10/18/2019 SLP Diet Recommendations Thin liquid;Dysphagia 3 (Mech soft) solids Liquid Administration via Cup;Straw Medication Administration Whole meds with puree Compensations Slow rate;Small sips/bites Postural Changes Seated upright at 90 degrees  CHL IP OTHER RECOMMENDATIONS 10/18/2019 Recommended Consults -- Oral Care Recommendations Oral care BID Other Recommendations --   CHL IP FOLLOW UP  RECOMMENDATIONS 10/18/2019 Follow up Recommendations (No Data)   CHL IP FREQUENCY AND DURATION 10/18/2019 Speech Therapy Frequency (ACUTE ONLY) min 1 x/week Treatment Duration 2 weeks      CHL IP ORAL PHASE 10/18/2019 Oral Phase Impaired Oral - Pudding Teaspoon -- Oral - Pudding Cup -- Oral - Honey Teaspoon -- Oral - Honey Cup -- Oral - Nectar Teaspoon -- Oral - Nectar Cup -- Oral - Nectar Straw WFL Oral - Thin Teaspoon WFL Oral - Thin Cup WFL Oral - Thin Straw WFL Oral - Puree WFL Oral - Mech Soft -- Oral - Regular Delayed oral transit;Piecemeal swallowing Oral - Multi-Consistency -- Oral - Pill -- Oral Phase - Comment --  CHL IP PHARYNGEAL PHASE 10/18/2019 Pharyngeal Phase Impaired Pharyngeal- Pudding Teaspoon -- Pharyngeal -- Pharyngeal- Pudding Cup -- Pharyngeal -- Pharyngeal- Honey Teaspoon -- Pharyngeal -- Pharyngeal- Honey Cup -- Pharyngeal -- Pharyngeal- Nectar Teaspoon -- Pharyngeal -- Pharyngeal- Nectar Cup -- Pharyngeal -- Pharyngeal- Nectar Straw Delayed swallow initiation-pyriform sinuses Pharyngeal Material does not enter airway Pharyngeal- Thin Teaspoon Delayed swallow initiation-pyriform sinuses Pharyngeal Material does not enter airway Pharyngeal- Thin Cup Delayed swallow initiation-pyriform sinuses;Reduced airway/laryngeal closure;Penetration/Aspiration before swallow Pharyngeal Material enters airway, remains ABOVE vocal cords then ejected out Pharyngeal- Thin Straw Delayed swallow initiation-pyriform sinuses;Reduced airway/laryngeal closure;Penetration/Aspiration before swallow Pharyngeal Material enters airway, remains ABOVE vocal cords then ejected out Pharyngeal- Puree Delayed swallow initiation-vallecula Pharyngeal Material does not enter airway Pharyngeal- Mechanical Soft -- Pharyngeal -- Pharyngeal- Regular Delayed swallow initiation-vallecula Pharyngeal Material does not enter airway Pharyngeal- Multi-consistency -- Pharyngeal -- Pharyngeal- Pill -- Pharyngeal -- Pharyngeal Comment --  CHL IP  CERVICAL ESOPHAGEAL PHASE 10/18/2019 Cervical Esophageal Phase WFL Pudding Teaspoon -- Pudding Cup -- Honey Teaspoon -- Honey Cup -- Nectar Teaspoon -- Nectar Cup -- Nectar Straw -- Thin Teaspoon -- Thin Cup -- Thin Straw -- Puree -- Mechanical Soft -- Regular -- Multi-consistency -- Pill -- Cervical Esophageal Comment -- Villa Herb M.S., CCC-SLP Acute Rehabilitation Services Office: [phone] Shanon Rosser [place] 10/18/2019, 10:45 AM                 Subjective: No acute events ON. Denies complaints.   Discharge Exam: Vitals:   10/21/19 0741 10/21/19 1139  BP: (!) 98/59 113/71  Pulse: 68 71  Resp: 16 18  Temp: 97.6 F (36.4 C) 98 F (36.7 C)  SpO2: 97% 98%   Vitals:   10/21/19 0353 10/21/19 0516 10/21/19 0741 10/21/19 1139  BP: 99/64  (!) 98/59 113/71  Pulse: 66  68 71  Resp: Temp: 99 F (37.2 C)  97.6 F (36.4 C) 98 F (36.7 C)  TempSrc: Oral  Oral Oral  SpO2: 99%  97% 98%  Weight:  63.6 kg    Height:        General: 43 y.o. female resting in bed in NAD, interpretation by family at bedside Cardiovascular: RRR, +S1, S2, no m/g/r, equal pulses throughout Respiratory: CTABL, no w/r/r, normal WOB GI: BS+, NDNT, no masses noted, no organomegaly noted MSK: No e/c/c Neuro: alert to name, follows commands, RUE weakness     The results of significant diagnostics from this hospitalization (including imaging, microbiology, ancillary and laboratory) are listed below for reference.     Microbiology: Recent Results (from the past 240 hour(s))  SARS CORONAVIRUS 2 (TAT 6-24 HRS) Nasopharyngeal  Nasopharyngeal Swab     Status: None   Collection Time: 10/15/19 10:00 PM   Specimen: Nasopharyngeal Swab  Result Value Ref Range Status   SARS Coronavirus 2 NEGATIVE NEGATIVE Final    Comment: (NOTE) SARS-CoV-2 target nucleic acids are NOT DETECTED. The SARS-CoV-2 RNA is generally detectable in upper and lower respiratory specimens during the acute phase of infection.  Negative results do not preclude SARS-CoV-2 infection, do not rule out co-infections with other pathogens, and should not be used as the sole basis for treatment or other patient management decisions. Negative results must be combined with clinical observations, patient history, and epidemiological information. The expected result is Negative. Fact Sheet for Patients: HairSlick.no Fact Sheet for Healthcare Providers: quierodirigir.com This test is not yet approved or cleared by the [place] FDA and  has been authorized for detection and/or diagnosis of SARS-CoV-2 by FDA under an Emergency Use Authorization (EUA). This EUA will remain  in effect (meaning this test can be used) for the duration of the COVID-19 declaration under Section 56 4(b)(1) of the Act, 21 U.S.C. section 360bbb-3(b)(1), unless the authorization is terminated or revoked sooner. Performed at [facility], [address] [place], [place] [address]   MRSA PCR Screening     Status: None   Collection Time: 10/16/19  1:32 PM   Specimen: Nasal Mucosa; Nasopharyngeal  Result Value Ref Range Status   MRSA by PCR NEGATIVE NEGATIVE Final    Comment:        The GeneXpert MRSA Assay (FDA approved for NASAL specimens only), is one component of a comprehensive MRSA colonization surveillance program. It is not intended to diagnose MRSA infection nor to guide or monitor treatment for MRSA infections. Performed at [facility], [address], [place], [place] [address]      Labs: BNP (last 3 results) No results for input(s): BNP in the last 8760 hours. Basic Metabolic Panel: Recent Labs  Lab 10/17/19 0432 10/18/19 0551 10/19/19 0552 10/20/19 0348 10/21/19 0315  NA 137 137 136 136 137  K 3.6 3.8 3.7 3.6 3.4*  CL 104 104 106 106 105  CO2 22 22 21* 20* 22  GLUCOSE 101* 90 89 97 94  BUN 10 8 10 11 9   CREATININE 0.74 0.71 0.80 0.65 0.64   CALCIUM 8.8* 8.6* 8.6* 8.9 8.9   Liver Function Tests: Recent Labs  Lab 10/15/19 1907  AST 113*  ALT 110*  ALKPHOS 94  BILITOT 0.7  PROT 8.7*  ALBUMIN 3.5   No results for input(s): LIPASE, AMYLASE in the last 168 hours. Recent Labs  Lab 10/16/19 0106  AMMONIA 28   CBC: Recent Labs  Lab 10/15/19 1907  10/17/19 0432 10/18/19 0551 10/19/19 0552 10/20/19 0348 10/21/19 0315  WBC 4.9  --  6.3 6.9 5.3 5.1 4.8  NEUTROABS 2.4  --   --   --   --   --   --   HGB 11.9*   < > 12.0 11.9* 11.7* 12.2 12.2  HCT 37.6   < > 37.7 36.8 36.5 37.3 37.3  MCV 79.7*  --  79.0* 78.1* 78.3* 78.2* 77.5*  PLT 239  --  242 219 237 236 243   < > = values in this interval not displayed.   Cardiac Enzymes: No results for input(s): CKTOTAL, CKMB, CKMBINDEX, TROPONINI in the last 168 hours. BNP: Invalid input(s): POCBNP CBG: Recent Labs  Lab 10/15/19 1930  GLUCAP 92   D-Dimer No results for  input(s): DDIMER in the last 72 hours. Hgb A1c No results for input(s): HGBA1C in the last 72 hours. Lipid Profile No results for input(s): CHOL, HDL, LDLCALC, TRIG, CHOLHDL, LDLDIRECT in the last 72 hours. Thyroid function studies No results for input(s): TSH, T4TOTAL, T3FREE, THYROIDAB in the last 72 hours.  Invalid input(s): FREET3 Anemia work up No results for input(s): VITAMINB12, FOLATE, FERRITIN, TIBC, IRON, RETICCTPCT in the last 72 hours. Urinalysis    Component Value Date/Time   COLORURINE YELLOW 10/16/2019 0608   APPEARANCEUR CLEAR 10/16/2019 0608   LABSPEC 1.034 (H) 10/16/2019 0608   PHURINE 6.0 10/16/2019 0608   GLUCOSEU NEGATIVE 10/16/2019 0608   HGBUR NEGATIVE 10/16/2019 0608   BILIRUBINUR NEGATIVE 10/16/2019 0608   KETONESUR NEGATIVE 10/16/2019 0608   PROTEINUR NEGATIVE 10/16/2019 0608   NITRITE NEGATIVE 10/16/2019 0608   LEUKOCYTESUR NEGATIVE 10/16/2019 0608   Sepsis Labs Invalid input(s): PROCALCITONIN,  WBC,  LACTICIDVEN Microbiology Recent Results (from the past 240  hour(s))  SARS CORONAVIRUS 2 (TAT 6-24 HRS) Nasopharyngeal Nasopharyngeal Swab     Status: None   Collection Time: 10/15/19 10:00 PM   Specimen: Nasopharyngeal Swab  Result Value Ref Range Status   SARS Coronavirus 2 NEGATIVE NEGATIVE Final    Comment: (NOTE) SARS-CoV-2 target nucleic acids are NOT DETECTED. The SARS-CoV-2 RNA is generally detectable in upper and lower respiratory specimens during the acute phase of infection. Negative results do not preclude SARS-CoV-2 infection, do not rule out co-infections with other pathogens, and should not be used as the sole basis for treatment or other patient management decisions. Negative results must be combined with clinical observations, patient history, and epidemiological information. The expected result is Negative. Fact Sheet for Patients: HairSlick.no Fact Sheet for Healthcare Providers: quierodirigir.com This test is not yet approved or cleared by the [place] FDA and  has been authorized for detection and/or diagnosis of SARS-CoV-2 by FDA under an Emergency Use Authorization (EUA). This EUA will remain  in effect (meaning this test can be used) for the duration of the COVID-19 declaration under Section 56 4(b)(1) of the Act, 21 U.S.C. section 360bbb-3(b)(1), unless the authorization is terminated or revoked sooner. Performed at [facility], [address], [place], [place] [address]   MRSA PCR Screening     Status: None   Collection Time: 10/16/19  1:32 PM   Specimen: Nasal Mucosa; Nasopharyngeal  Result Value Ref Range Status   MRSA by PCR NEGATIVE NEGATIVE Final    Comment:        The GeneXpert MRSA Assay (FDA approved for NASAL specimens only), is one component of a comprehensive MRSA colonization surveillance program. It is not intended to diagnose MRSA infection nor to guide or monitor treatment for MRSA infections. Performed at [facility]  [facility], [address], [place], [place] [address]      Time coordinating discharge: 35 minutes  SIGNED:   Teddy Spike, DO  [facility] 10/21/2019, 3:18 PM Pager   If 7PM-7AM, please contact night-coverage [email or web address] Password [email or web address]

## 2019-10-21 NOTE — Progress Notes (Signed)
Inpatient Rehab Admissions Coordinator:   I am awaiting insurance authorization for possible admission to CIR today, pending approval.    Shann Medal, PT, DPT Admissions Coordinator [phone] 10/21/19  11:14 AM

## 2019-10-22 ENCOUNTER — Inpatient Hospital Stay (HOSPITAL_COMMUNITY): Payer: BLUE CROSS/BLUE SHIELD | Admitting: Occupational Therapy

## 2019-10-22 ENCOUNTER — Inpatient Hospital Stay (HOSPITAL_COMMUNITY): Payer: BLUE CROSS/BLUE SHIELD | Admitting: Speech Pathology

## 2019-10-22 ENCOUNTER — Inpatient Hospital Stay (HOSPITAL_COMMUNITY): Payer: BLUE CROSS/BLUE SHIELD | Admitting: Physical Therapy

## 2019-10-22 DIAGNOSIS — Z789 Other specified health status: Secondary | ICD-10-CM

## 2019-10-22 DIAGNOSIS — I610 Nontraumatic intracerebral hemorrhage in hemisphere, subcortical: Secondary | ICD-10-CM | POA: Diagnosis not present

## 2019-10-22 DIAGNOSIS — Z7409 Other reduced mobility: Secondary | ICD-10-CM | POA: Diagnosis not present

## 2019-10-22 LAB — CBC WITH DIFFERENTIAL/PLATELET
Abs Immature Granulocytes: 0.01 10*3/uL (ref 0.00–0.07)
Basophils Absolute: 0 10*3/uL (ref 0.0–0.1)
Basophils Relative: 1 %
Eosinophils Absolute: 0.1 10*3/uL (ref 0.0–0.5)
Eosinophils Relative: 2 %
HCT: 37.9 % (ref 36.0–46.0)
Hemoglobin: 12.4 g/dL (ref 12.0–15.0)
Immature Granulocytes: 0 %
Lymphocytes Relative: 39 %
Lymphs Abs: 2.1 10*3/uL (ref 0.7–4.0)
MCH: 25.7 pg — ABNORMAL LOW (ref 26.0–34.0)
MCHC: 32.7 g/dL (ref 30.0–36.0)
MCV: 78.6 fL — ABNORMAL LOW (ref 80.0–100.0)
Monocytes Absolute: 0.5 10*3/uL (ref 0.1–1.0)
Monocytes Relative: 9 %
Neutro Abs: 2.6 10*3/uL (ref 1.7–7.7)
Neutrophils Relative %: 49 %
Platelets: 242 10*3/uL (ref 150–400)
RBC: 4.82 MIL/uL (ref 3.87–5.11)
RDW: 15 % (ref 11.5–15.5)
WBC: 5.4 10*3/uL (ref 4.0–10.5)
nRBC: 0 % (ref 0.0–0.2)

## 2019-10-22 LAB — COMPREHENSIVE METABOLIC PANEL
ALT: 81 U/L — ABNORMAL HIGH (ref 0–44)
AST: 65 U/L — ABNORMAL HIGH (ref 15–41)
Albumin: 3.2 g/dL — ABNORMAL LOW (ref 3.5–5.0)
Alkaline Phosphatase: 76 U/L (ref 38–126)
Anion gap: 10 (ref 5–15)
BUN: 12 mg/dL (ref 6–20)
CO2: 22 mmol/L (ref 22–32)
Calcium: 9 mg/dL (ref 8.9–10.3)
Chloride: 105 mmol/L (ref 98–111)
Creatinine, Ser: 0.69 mg/dL (ref 0.44–1.00)
GFR calc Af Amer: >60 mL/min (ref >60)
GFR calc non Af Amer: >60 mL/min (ref >60)
Glucose, Bld: 101 mg/dL — ABNORMAL HIGH (ref 70–99)
Potassium: 3.5 mmol/L (ref 3.5–5.1)
Sodium: 137 mmol/L (ref 135–145)
Total Bilirubin: 0.5 mg/dL (ref 0.3–1.2)
Total Protein: 8.7 g/dL — ABNORMAL HIGH (ref 6.5–8.1)

## 2019-10-22 NOTE — Care Management Note (Signed)
Inpatient Rehabilitation Center Individual Statement of Services  Patient Name:  Lindsay Howard  Date:  10/22/2019  Welcome to the [place].  Our goal is to provide you with an individualized program based on your diagnosis and situation, designed to meet your specific needs.  With this comprehensive rehabilitation program, you will be expected to participate in at least 3 hours of rehabilitation therapies Monday-Friday, with modified therapy programming on the weekends.  Your rehabilitation program will include the following services:  Physical Therapy (PT), Occupational Therapy (OT), Speech Therapy (ST), 24 hour per day rehabilitation nursing, Neuropsychology, Case Management (Social Worker), Rehabilitation Medicine, Nutrition Services and Pharmacy Services  Weekly team conferences will be held on Wednesday to discuss your progress.  Your Social Worker will talk with you frequently to get your input and to update you on team discussions.  Team conferences with you and your family in attendance may also be held.  Expected length of stay: 2.5-3 weeks  Overall anticipated outcome: supervision with cueing  Depending on your progress and recovery, your program may change. Your Social Worker will coordinate services and will keep you informed of any changes. Your Social Worker's name and contact numbers are listed  below.  The following services may also be recommended but are not provided by the [place] will be made to provide these services after discharge if needed.  Arrangements include referral to agencies that provide these services.  Your insurance has been verified to be:  [employer] [place] Your primary doctor is:  None  Pertinent information will be shared with your doctor and your insurance  company.  Social Worker:  Ovidio Kin, [place] or [phone]  Information discussed with and copy given to patient by: Elease Hashimoto, 10/22/2019, 8:56 AM

## 2019-10-22 NOTE — Progress Notes (Signed)
PHYSICAL MEDICINE & REHABILITATION PROGRESS NOTE   Subjective/Complaints:  Using audio interpreter PT in room  ROS- denies pain or problems with breathing  Objective:   No results found. Recent Labs    10/21/19 0315 10/22/19 0507  WBC 4.8 5.4  HGB 12.2 12.4  HCT 37.3 37.9  PLT 243 242   Recent Labs    10/21/19 0315 10/22/19 0507  NA 137 137  K 3.4* 3.5  CL 105 105  CO2 22 22  GLUCOSE 94 101*  BUN 9 12  CREATININE 0.64 0.69  CALCIUM 8.9 9.0    Intake/Output Summary (Last 24 hours) at 10/22/2019 0806 Last data filed at 10/21/2019 2045 Gross per 24 hour  Intake 100 ml  Output -  Net 100 ml     Physical Exam: Vital Signs Blood pressure 119/80, pulse 67, temperature 97.8 F (36.6 C), resp. rate 17, height 4\' 11"  (1.499 m), weight 61.9 kg, SpO2 99 %.   General: No acute distress Mood and affect are appropriate Heart: Regular rate and rhythm no rubs murmurs or extra sounds Lungs: Clear to auscultation, breathing unlabored, no rales or wheezes Abdomen: Positive bowel sounds, soft nontender to palpation, nondistended Extremities: No clubbing, cyanosis, or edema Skin: No evidence of breakdown, no evidence of rash Neurologic: Cranial nerves II through XII intact, motor strength is 5/5 in left  deltoid, bicep, tricep, grip, hip flexor, knee extensors, ankle dorsiflexor and plantar flexor Sensory exam difficult to ascertain even with interpreter , does feel pinch RUE and RLE  Right side trace finger flexion otherwise 0 RLE 3- KE and ankkle DF/PT Unable to perform on Right due to weaknss Good sittin gbalance Musculoskeletal: Full range of motion in all 4 extremities. No joint swelling   Assessment/Plan: 1. 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  Physiatrist is providing close team supervision and 24 hour management of active medical  problems listed below.  Physiatrist and rehab team continue to assess barriers to discharge/monitor patient progress toward functional and medical goals  Care Tool:  Bathing              Bathing assist       Upper Body Dressing/Undressing Upper body dressing   What is the patient wearing?: Hospital gown only    Upper body assist Assist Level: Minimal Assistance - Patient > 75%    Lower Body Dressing/Undressing Lower body dressing      What is the patient wearing?: Incontinence brief     Lower body assist Assist for lower body dressing: Minimal Assistance - Patient > 75%     Toileting Toileting    Toileting assist Assist for toileting: Minimal Assistance - Patient > 75%     Transfers Chair/bed transfer  Transfers assist           Locomotion Ambulation   Ambulation assist              Walk 10 feet activity   Assist           Walk 50 feet activity   Assist           Walk 150 feet activity   Assist           Walk 10 feet on uneven surface  activity   Assist           Wheelchair     Assist  Wheelchair 50 feet with 2 turns activity    Assist            Wheelchair 150 feet activity     Assist          Blood pressure 119/80, pulse 67, temperature 97.8 F (36.6 C), resp. rate 17, height 4\' 11"  (1.499 m), weight 61.9 kg, SpO2 99 %.  Medical Problem List and Plan: 1.  Impaired mobility and ADLs secondary to left thalamic hemorrhage             Impairments include expressive aphasia, R sided hemiplegia worse in the upper extremity, R LE ataxia, impaired coordination, impaired motor planning, currently ambulating only with MaxAx2.   Initial PT, OT SLP evals today  2.  Antithrombotics: -DVT/anticoagulation:  Pharmaceutical: Lovenox             -antiplatelet therapy: N/A 3. Pain Management: N/A 4. Mood: LCSW to follow for evaluation and support as able               -antipsychotic agents: N/a 5. Neuropsych: This patient is not capable of making decisions on her own behalf. 6. Skin/Wound Care: Routine pressure relief measures.  7. Fluids/Electrolytes/Nutrition: Intake poor--will ask family to provide meals as able.  8. Hypertension: Patient will benefit from hypertension management education as she was not on any medication prior to this admission. Continue Norvasc 5mg  QD. Goal SBP <140. Vitals:   10/21/19 1936 10/22/19 0651  BP: 117/82 119/80  Pulse: 74 67  Resp: 16 17  Temp: 97.8 F (36.6 C) 97.8 F (36.6 C)  SpO2: 99% 99%  controlled 11/12 9. Constipation: On Senna-Docusate BID. Type 4 BM yesterday  10. Disposition: Will require follow-up with PCP and Physiatry upon discharge.  11. Education: Will require education regarding lowering her risk of HTN and stroke.     LOS: 1 days A FACE TO FACE EVALUATION WAS PERFORMED  Lindsay Howard 10/22/2019, 8:06 AM

## 2019-10-22 NOTE — Plan of Care (Signed)
  Problem: Consults Goal: RH STROKE PATIENT EDUCATION Description: See Patient Education module for education specifics  Outcome: Progressing   Problem: RH BOWEL ELIMINATION Goal: RH STG MANAGE BOWEL WITH ASSISTANCE Description: STG Manage Bowel with min Assistance. Outcome: Progressing Goal: RH STG MANAGE BOWEL W/MEDICATION W/ASSISTANCE Description: STG Manage Bowel with Medication with min Assistance. Outcome: Progressing   Problem: RH BLADDER ELIMINATION Goal: RH STG MANAGE BLADDER WITH ASSISTANCE Description: STG Manage Bladder With min Assistance Outcome: Progressing   Problem: RH SAFETY Goal: RH STG ADHERE TO SAFETY PRECAUTIONS W/ASSISTANCE/DEVICE Description: STG Adhere to Safety Precautions With supervision/min Assistance/Device. Outcome: Progressing   Problem: RH KNOWLEDGE DEFICIT Goal: RH STG INCREASE KNOWLEDGE OF HYPERTENSION Description: Patient/caregiver will verbalize understanding of HTN including monitoring, medications, diet changes, exercise, and follow up care with min/mod assist. Outcome: Not Progressing Goal: RH STG INCREASE KNOWLEGDE OF HYPERLIPIDEMIA Description: Patient/caregiver will verbalize understanding of HLD including monitoring, medications, diet changes, exercise, and follow up care with min/mod assist. Outcome: Not Progressing Goal: RH STG INCREASE KNOWLEDGE OF STROKE PROPHYLAXIS Description: Patient/caregiver will verbalize understanding of stroke prophylaxis including monitoring, medications, diet changes, exercise, and follow up care with min/mod assist. Outcome: Not Progressing

## 2019-10-22 NOTE — Progress Notes (Signed)
Inpatient Rehabilitation  Patient information reviewed and entered into eRehab system by Chanel Mckesson M. Davontae Prusinski, M.A., CCC/SLP, PPS Coordinator.  Information including medical coding, functional ability and quality indicators will be reviewed and updated through discharge.    

## 2019-10-22 NOTE — Evaluation (Addendum)
Speech Language Pathology Assessment and Plan  Patient Details  Name: Lindsay Howard MRN: [id number] Date of Birth: 1976-05-29  SLP Diagnosis: Dysarthria;Cognitive Impairments  Rehab Potential: Good ELOS: 2 weeks    Today's Date: 10/22/2019 SLP Individual Time: 1000-1055 SLP Individual Time Calculation (min): 55 min   Problem List:  Patient Active Problem List   Diagnosis Date Noted  . Thalamic hemorrhage ([place]) 10/21/2019  . Intracerebral hemorrhage 10/15/2019  . ICH (intracerebral hemorrhage) ([place]) 10/15/2019  . Bulky or enlarged uterus 02/15/2016  . Acanthosis nigricans 02/15/2016  . Obesity 02/15/2016   Past Medical History:  Past Medical History:  Diagnosis Date  . Bulky or enlarged uterus    Past Surgical History:  Past Surgical History:  Procedure Laterality Date  . CESAREAN SECTION  2009, 2004  . CESAREAN SECTION      Assessment / Plan / Recommendation Clinical Impression Patient is a 21 year oldnon-english speakingfemalefrom [place] history of HTN- no medications otherwise in good health who was admitted on 10/15/19 with inability to communicate and moaning incoherently after a nap that evening.She speaks [name] (also Insurance account manager).  UDS negative. 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  She was started on Clevidipine drip for better control. Follow up MRI brain showed stable left thalamic hemorrhage with mild progression of rim of edema. 2D echo showed EF of 60-65% with normal diastolic/systolic function and no wall abnormality. Bleed felt to be hypertensive in nature and Norvasc started. NGT placed as patient NPO.  F/u MBS done 11/8 and she was started on dysphagia 3, thins as mild aspiration risk. She continues to have expressive aphasia with left lean, right sided weakness.  Therapy evaluations were  completed and pt was recommended for CIR and admitted 10/21/19.  Patient administered a cognitive-linguistic evaluation and BSE with use of the video interpreter without family present to confirm baseline level of functioning. Patient demonstrates severe cognitive impairments characterized by impaired orientation, impaired basic problem solving, impaired intellectual awareness of deficits and impaired recall of daily information which impacts her safety with functional and familiar tasks. Patient's overall auditory comprehension appeared [facility] for all tasks assessed without evidence of word-finding. However, intelligibility appeared reduced due to a low vocal intensity and left oral-motor weakness and decreased ROM. Patient's oral-motor weakness also resulted in mildly prolonged mastication with solid textures, however, patient able to demonstrate complete oral clearance. No overt s/s of aspiration were noted with thin liquids. Recommend patient continue current diet of regular textures with thin liquids with full supervision to maximize safety due to cognitive deficits. Patient would benefit from skilled SLP intervention to maximize her cognitive and speech function prior to discharge.    Skilled Therapeutic Interventions          Administered a cognitive-linguistic evaluation and BSE, please see above for details.   SLP Assessment  Patient will need skilled Speech Lanaguage Pathology Services during CIR admission    Recommendations  SLP Diet Recommendations: Age appropriate regular solids;Thin Medication Administration: Crushed with puree(patient tends to chew tablets) Supervision: Patient able to self feed;Full supervision/cueing for compensatory strategies Compensations: Slow rate;Small sips/bites;Follow solids with liquid Postural Changes and/or Swallow Maneuvers: Seated upright 90 degrees Oral Care Recommendations: Oral care BID Recommendations for Other Services: Neuropsych consult Patient  destination: Home Follow up Recommendations: Home Health SLP;24 hour supervision/assistance Equipment Recommended: None recommended  by SLP    SLP Frequency 3 to 5 out of 7 days   SLP Duration  SLP Intensity  SLP Treatment/Interventions 2 weeks  Minumum of 1-2 x/day, 30 to 90 minutes  Cognitive remediation/compensation;Internal/external aids;Speech/Language facilitation;Therapeutic Activities;Environmental controls;Cueing hierarchy;Patient/family education;Functional tasks    Pain Pain Assessment Pain Scale: Faces Pain Score: 0-No pain  SLP Evaluation Cognition Overall Cognitive Status: Impaired/Different from baseline Arousal/Alertness: Lethargic Orientation Level: Oriented to person;Disoriented to place;Disoriented to time;Disoriented to situation Attention: Sustained Sustained Attention: Appears intact Memory: Impaired Memory Impairment: Decreased recall of new information;Decreased short term memory Decreased Short Term Memory: Verbal basic;Functional basic Awareness: Impaired Awareness Impairment: Intellectual impairment Problem Solving: Impaired Problem Solving Impairment: Functional basic;Verbal basic Safety/Judgment: Impaired  Comprehension Auditory Comprehension Overall Auditory Comprehension: Appears within functional limits for tasks assessed Yes/No Questions: Within Functional Limits Commands: Within Functional Limits Visual Recognition/Discrimination Discrimination: Not tested Reading Comprehension Reading Status: Not tested Expression Expression Primary Mode of Expression: Verbal Verbal Expression Overall Verbal Expression: Appears within functional limits for tasks assessed Written Expression Dominant Hand: Right Written Expression: Not tested Oral Motor Oral Motor/Sensory Function Overall Oral Motor/Sensory Function: Mild impairment Facial Symmetry: Abnormal symmetry right Lingual ROM: Reduced right;Reduced left Motor Speech Overall Motor  Speech: Impaired Respiration: Within functional limits Phonation: Low vocal intensity Resonance: Within functional limits Articulation: Within functional limitis Intelligibility: Intelligibility reduced Word: 75-100% accurate Phrase: 50-74% accurate Sentence: 50-74% accurate Motor Planning: Witnin functional limits Effective Techniques: Increased vocal intensity   Bedside Swallowing Assessment General Date of Onset: 10/15/19 Previous Swallow Assessment: MBS 11/8: Recommended Dys. 3 textures with thin liquids but has since been upgraded to regular textures Diet Prior to this Study: Regular;Thin liquids Temperature Spikes Noted: No Respiratory Status: Room air History of Recent Intubation: No Behavior/Cognition: Requires cueing;Confused;Lethargic/Drowsy Oral Cavity - Dentition: Adequate natural dentition Self-Feeding Abilities: Able to feed self;Needs assist Patient Positioning: Upright in bed Baseline Vocal Quality: Low vocal intensity Volitional Cough: Weak Volitional Swallow: Able to elicit  Ice Chips Ice chips: Not tested Thin Liquid Thin Liquid: Within functional limits Presentation: Straw Nectar Thick Nectar Thick Liquid: Not tested Honey Thick Honey Thick Liquid: Not tested Puree Puree: Impaired Presentation: Self Fed Oral Phase Functional Implications: Prolonged oral transit Solid Solid: Impaired Presentation: Self Fed Oral Phase Impairments: Impaired mastication Oral Phase Functional Implications: Prolonged oral transit;Impaired mastication BSE Assessment Risk for Aspiration Impact on safety and function: Mild aspiration risk Other Related Risk Factors: Cognitive impairment  Short Term Goals: Week 1: SLP Short Term Goal 1 (Week 1): Patient will demonstrate orientation to place, time and situation with Max A multimodal cues. SLP Short Term Goal 2 (Week 1): Patient will demonstrate functional problem solving for basic and familiar tasks with Max A multimodal  cues. SLP Short Term Goal 3 (Week 1): Patient will identify 2 physical and 2 cognitive deficits with Max A multimodal cues. SLP Short Term Goal 4 (Week 1): Patient will recall new, daily information with Max A multimodal cues. SLP Short Term Goal 5 (Week 1): Patient will maximize speech intelligibility to ~90% at the sentence level with use of an increased vocal intensity with Min A verbal cues  Refer to Care Plan for Long Term Goals  Recommendations for other services: Neuropsych  Discharge Criteria: Patient will be discharged from SLP if patient refuses treatment 3 consecutive times without medical reason, if treatment goals not met, if there is a change in medical status, if patient makes no progress towards goals or if patient is discharged from hospital.  The above assessment, treatment plan, treatment alternatives and goals were discussed and mutually agreed upon: by patient  Djuana Littleton 10/22/2019, 11:53 AM

## 2019-10-22 NOTE — Evaluation (Signed)
Physical Therapy Assessment and Plan  Patient Details  Name: Lindsay Howard MRN: [id number] Date of Birth: Apr 25, 1976  PT Diagnosis: Abnormality of gait, Difficulty walking, Hemiparesis non-dominant, Impaired cognition, Impaired sensation and Muscle weakness Rehab Potential: Good ELOS: 2.5-3 weeks   Today's Date: 10/22/2019 PT Individual Time: 0802-0905 PT Individual Time Calculation (min): 63 min    Problem List:  Patient Active Problem List   Diagnosis Date Noted  . 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 Intracerebral hemorrhage 10/15/2019  . ICH (intracerebral hemorrhage) ([place]) 10/15/2019  . Bulky or enlarged uterus 02/15/2016  . Acanthosis nigricans 02/15/2016  . Obesity 02/15/2016    Past Medical History:  Past Medical History:  Diagnosis Date  . Bulky or enlarged uterus    Past Surgical History:  Past Surgical History:  Procedure Laterality Date  . CESAREAN SECTION  2009, 2004  . CESAREAN SECTION      Assessment & Plan Clinical Impression: Patient is a 43 y.o. year old non-english speaking female from [place] with history of HTN- no medications otherwise in good health who was admitted on 10/15/19 with inability to communicate and moaning incoherently after a nap that evening.  She speaks [name] (also called [name])  UDS negative.  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 Follow up MRI brain showed stable left thalamic hemorrhage with mild progression of rim of edema. 2D echo showed EF of 60-65% with normal diastolic/systolic function and no wall abnormality. Bleed felt to be hypertensive in nature and Norvasc. NGT placed as patient NPO. MBS done 11/8 and she was started on dysphagia 3, thins as mild aspiration risk. She continues to have expressive aphasia  with left lean, right sided weakness.  Patient transferred to CIR on 10/21/2019 .   Patient currently requires mod with mobility secondary to muscle weakness, decreased cardiorespiratoy endurance, impaired timing and sequencing, abnormal tone, unbalanced muscle activation and decreased motor planning, decreased attention to right, decreased attention, decreased awareness, decreased problem solving, decreased safety awareness and decreased memory and decreased sitting balance, decreased standing balance, decreased postural control and decreased balance strategies.  Prior to hospitalization, patient was independent  with mobility and lived with Family in a House home.  Home access is 2Stairs to enter.  Patient will benefit from skilled PT intervention to maximize safe functional mobility, minimize fall risk and decrease caregiver burden for planned discharge home with 24 hour supervision.  Anticipate patient will benefit from follow up [place] at discharge.  PT - End of Session Activity Tolerance: Tolerates 30+ min activity with multiple rests Endurance Deficit: Yes Endurance Deficit Description: multiple rest breaks secondary to fatigue PT Assessment Rehab Potential (ACUTE/IP ONLY): Good PT Barriers to Discharge: [place] home environment;Home environment access/layout;Decreased caregiver support PT Patient demonstrates impairments in the following area(s): Balance;Safety;Sensory;Behavior;Endurance;Motor;Perception PT Transfers Functional Problem(s): Bed Mobility;Bed to Chair;Car;Furniture PT Locomotion Functional Problem(s): Ambulation;Wheelchair Mobility;Stairs PT Plan PT Intensity: Minimum of 1-2 x/day ,45 to 90 minutes PT Frequency: 5 out of 7 days PT Duration Estimated Length of Stay: 2.5-3 weeks PT Treatment/Interventions: Ambulation/gait training;Community reintegration;DME/adaptive equipment instruction;Neuromuscular re-education;Psychosocial support;Stair training;UE/LE Strength  taining/ROM;Wheelchair propulsion/positioning;Balance/vestibular training;Discharge planning;Functional electrical stimulation;Pain management;Skin care/wound management;Therapeutic Activities;UE/LE Coordination activities;Cognitive remediation/compensation;Disease management/prevention;Functional mobility training;Patient/family education;Splinting/orthotics;Therapeutic Exercise;Visual/perceptual remediation/compensation PT Transfers Anticipated Outcome(s): supervision PT Locomotion Anticipated Outcome(s): supervision using LRAD PT  Recommendation Follow Up Recommendations: Home health PT;24 hour supervision/assistance Patient destination: Home Equipment Recommended: To be determined  Skilled Therapeutic Intervention Evaluation completed (see details above and below) with education on PT POC and goals and individual treatment initiated with focus on bed mobility, transfers, gait training, stair navigation, activity tolerance and education regarding daily therapy schedule, weekly team meetings, purpose of PT evaluation, and other CIR information. Patient received R sidelying in bed, asleep but upon awakening agreeable to therapy session. RN in/out for medication administration. [place] call interpreter present throughout session (ID # [id number]). Supine>sit, HOB partially elevated and using bedrails, with CGA for safety and increased time. Sitting EOB required close supervision for static sitting balance safety. MD in/out for morning assessment. Stand pivot transfer EOB>w/c with mod assist for balance and R LE management.  Transported to/from gym in w/c. Ambulated 60f with R UE support around therapist and mod assist for balance and R LE management - pt able to bring R foot through during swing with increased hip adduction/scissoring requiring assistance for repositioning, maintains R knee flexed during gait requiring tactile cuing and manual facilitation for improved R knee extension during stance phase, R  lateral trunk lean. Ascended/descened 4 steps using R UE support on each handrail with mod assist for balance - pt demonstrating poor safety awareness by performing step-to pattern leading with R LE on ascent and descent. Stand pivot simulated car transfer (sedan height) with mod assist for balance - pt able to place R LE in car without assist but required assist for R LE management when exiting. Transported back to room and pt requesting to return to bed. Stand pivot w/c>EOB with min assist using w/c arm rests and bedrail. Sit>supine with min assist for R LE management and trunk descent. Pt left sidelying in bed with needs in reach and bed alarm on.  PT Evaluation Precautions/Restrictions Precautions Precautions: Fall Precaution Comments: R hemiparesis, poor safety awareness Restrictions Weight Bearing Restrictions: No Pain Pain Assessment Pain Scale: 0-10 Pain Score: 0-No pain Home Living/Prior Functioning Home Living Available Help at Discharge: Family;Available 24 hours/day Type of Home: House Home Access: Stairs to enter [employer] Steps: 2 Home Layout: One level  Lives With: Family Prior Function Level of Independence: Independent with basic ADLs;Independent with homemaking with ambulation;Independent with gait;Independent with transfers Comments: Per pt's son, pt was independent for all ADLs and IADLs - information obtained from chart review and OT's conversation with pt's family Perception  Perception Perception: Impaired Inattention/Neglect: Does not attend to right visual field;Does not attend to right side of body Praxis Praxis: Impaired Praxis Impairment Details: Motor planning  Cognition Overall Cognitive Status: No family/caregiver present to determine baseline cognitive functioning(per interpreter pt intermittently difficult to comprehend) Arousal/Alertness: Awake/alert Orientation Level: Oriented to person;Disoriented to place;Disoriented to  time;Disoriented to situation([place] pt [place]) Awareness: Impaired Safety/Judgment: Impaired Sensation Sensation Light Touch: Impaired by gross assessment Light Touch Impaired Details: Impaired RUE;Impaired RLE(pt reports she cannot feel light touch to R LE) Hot/Cold: Not tested Proprioception: Impaired by gross assessment Stereognosis: Not tested Coordination Gross Motor Movements are Fluid and Coordinated: No Coordination and Movement Description: impaired due to R hemiparesis, R inattention, and impaired proprioception Motor  Motor Motor: Hemiplegia;Abnormal tone Motor - Skilled Clinical Observations: R hemiparesis  Mobility Bed Mobility Bed Mobility: Supine to Sit;Sit to Supine Supine to Sit: Contact Guard/Touching assist Sit to Supine: Minimal Assistance - Patient > 75% Transfers  Transfers: Sit to Stand;Stand to [employer] Transfers Sit to Stand: Moderate Assistance - Patient 50-74% Stand to Sit: Moderate Assistance - Patient 50-74% Stand Pivot Transfers: Moderate Assistance - Patient 50 - 74% Stand Pivot Transfer Details: Tactile cues for sequencing;Visual cues/gestures for sequencing;Verbal cues for technique;Tactile cues for initiation;Tactile cues for placement Transfer (Assistive device): None Locomotion  Gait Ambulation: Yes Gait Assistance: Moderate Assistance - Patient 50-74% Gait Distance (Feet): 90 Feet Assistive device: 1 person hand held assist Gait Assistance Details: Tactile cues for weight shifting;Tactile cues for sequencing;Tactile cues for initiation;Tactile cues for posture;Tactile cues for placement;Manual facilitation for weight bearing;Manual facilitation for placement;Manual facilitation for weight shifting Gait Gait: Yes Gait Pattern: Impaired Gait Pattern: Decreased stance time - right;Decreased stride length;Decreased step length - right;Poor foot  clearance - right;Right flexed knee in stance Gait velocity: decreased Stairs / Additional Locomotion Stairs: Yes Stairs Assistance: Moderate Assistance - Patient 50 - 74% Stair Management Technique: Two rails Number of Stairs: 4 Height of Stairs: 6 Wheelchair Mobility Wheelchair Mobility: No  Trunk/Postural Assessment  Cervical Assessment Cervical Assessment: Within Functional Limits Thoracic Assessment Thoracic Assessment: Within Functional Limits Lumbar Assessment Lumbar Assessment: Within Functional Limits Postural Control Postural Control: Deficits on evaluation Righting Reactions: delayed and inadequate Postural Limitations: decreased  Balance Balance Balance Assessed: Yes Static Sitting Balance Static Sitting - Balance Support: Feet supported Static Sitting - Level of Assistance: 5: Stand by assistance Dynamic Sitting Balance Dynamic Sitting - Balance Support: Feet supported Dynamic Sitting - Level of Assistance: 5: Stand by assistance;4: Min assist Static Standing Balance Static Standing - Balance Support: During functional activity;Right upper extremity supported Static Standing - Level of Assistance: 3: Mod assist;4: Min assist Dynamic Standing Balance Dynamic Standing - Balance Support: During functional activity;Right upper extremity supported Dynamic Standing - Level of Assistance: 3: Mod assist Extremity Assessment  RLE Assessment RLE Assessment: Exceptions to [facility] RLE Strength Right Hip Flexion: 3+/5 Right Knee Flexion: 3+/5 Right Knee Extension: 3+/5 Right Ankle Dorsiflexion: 3-/5 Right Ankle Plantar Flexion: 3-/5 LLE Assessment LLE Assessment: Exceptions to [facility] LLE Strength Left Hip Flexion: 4+/5 Left Knee Flexion: 4+/5 Left Knee Extension: 4+/5 Left Ankle Dorsiflexion: 4+/5 Left Ankle Plantar Flexion: 4+/5    Refer to Care Plan for Long Term Goals  Recommendations for other services: None   Discharge Criteria: Patient will be discharged  from PT if patient refuses treatment 3 consecutive times without medical reason, if treatment goals not met, if there is a change in medical status, if patient makes no progress towards goals or if patient is discharged from hospital.  The above assessment, treatment plan, treatment alternatives and goals were discussed and mutually agreed upon: by patient  Tawana Scale, PT, DPT 10/22/2019, 7:46 AM

## 2019-10-22 NOTE — Evaluation (Signed)
Occupational Therapy Assessment and Plan  Patient Details  Name: Lindsay Howard MRN: [id number] Date of Birth: 02/04/1976  OT Diagnosis: cognitive deficits, disturbance of vision, hemiplegia affecting dominant side, muscle weakness (generalized) and coordination disorder Rehab Potential: Rehab Potential (ACUTE ONLY): Good ELOS: 2.5 - 3 wks   Today's Date: 10/22/2019 OT Individual Time: 1300-1410 OT Individual Time Calculation (min): 70 min     Problem List:  Patient Active Problem List   Diagnosis Date Noted  . Thalamic hemorrhage ([facility]) 10/21/2019  . Intracerebral hemorrhage 10/15/2019  . ICH (intracerebral hemorrhage) ([facility]) 10/15/2019  . Bulky or enlarged uterus 02/15/2016  . Acanthosis nigricans 02/15/2016  . Obesity 02/15/2016    Past Medical History:  Past Medical History:  Diagnosis Date  . Bulky or enlarged uterus    Past Surgical History:  Past Surgical History:  Procedure Laterality Date  . CESAREAN SECTION  2009, 2004  . CESAREAN SECTION      Assessment & Plan Clinical Impression: Patient is a 43 y.o. year old female from [place] with history of HTN- no medications otherwise in good health who was admitted on 10/15/19 with inability to communicate and moaning incoherently after a nap that evening.  She speaks [place] (also called [name])  UDS negative.  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 Follow up MRI brain showed stable left thalamic hemorrhage with mild progression of rim of edema. 2D echo showed EF of 60-65% with normal diastolic/systolic function and no wall abnormality. Bleed felt to be hypertensive in nature and Norvasc. NGT placed as patient NPO. MBS done 11/8 and she was started on dysphagia 3, thins as mild aspiration risk. She continues to  have expressive aphasia with left lean, right sided weakness. .  Patient transferred to CIR on 10/21/2019 .    Patient currently requires mod with basic self-care skills secondary to muscle weakness, decreased cardiorespiratoy endurance, decreased coordination, inattention, decreased attention to right, decreased awareness, decreased problem solving, decreased safety awareness and decreased memory and decreased sitting balance, decreased standing balance and hemiplegia.  Prior to hospitalization, patient could complete ADLs and IADLs with independent .  Patient will benefit from skilled intervention to decrease level of assist with basic self-care skills prior to discharge home with care partner.  Anticipate patient will require 24 hour supervision and follow up home health.  OT - End of Session Activity Tolerance: Decreased this session Endurance Deficit: Yes Endurance Deficit Description: multiple rest breaks secondary to fatigue OT Assessment Rehab Potential (ACUTE ONLY): Good OT Barriers to Discharge: (none known at this time) OT Patient demonstrates impairments in the following area(s): Balance;Cognition;Endurance;Motor;Pain;Safety OT Basic ADL's Functional Problem(s): Grooming;Bathing;Dressing;Toileting OT Transfers Functional Problem(s): Toilet;Tub/Shower OT Additional Impairment(s): Fuctional Use of Upper Extremity OT Plan OT Intensity: Minimum of 1-2 x/day, 45 to 90 minutes OT Frequency: 5 out of 7 days OT Duration/Estimated Length of Stay: 2.5 - 3 wks OT Treatment/Interventions: Balance/vestibular training;Neuromuscular re-education;Self Care/advanced ADL retraining;Therapeutic Exercise;Cognitive remediation/compensation;DME/adaptive equipment instruction;Pain management;UE/LE Strength taining/ROM;Community reintegration;Functional electrical stimulation;Patient/family education;UE/LE Coordination activities;Discharge planning;Functional mobility training;Psychosocial  support;Therapeutic Activities;Visual/perceptual remediation/compensation OT Self Feeding Anticipated Outcome(s): S OT Basic Self-Care Anticipated Outcome(s): S OT Toileting Anticipated Outcome(s): S OT Bathroom Transfers Anticipated Outcome(s): S OT Recommendation Recommendations for Other Services: Therapeutic Recreation consult Therapeutic Recreation Interventions: Kitchen group Patient   destination: Home Follow Up Recommendations: Home health OT;24 hour supervision/assistance Equipment Recommended: To be determined   Skilled Therapeutic Intervention Upon entering the room, pt seated in bed in long sitting position. OT utilized audio interpreter throughout the session. Pt agreeable to OT intervention and without c/o pain this session. Pt verbalized need for toileting and transferred from bed >wheelchair with mod A. Pt performed same transfer onto toilet and able to perform hygiene with set up A. Pt needing assistance with clothing management this session. Pt returning to sit in wheelchair and engaged in bathing and dressing task at sink. Pt required mod A overall for UB self care and max A for LB self care. Pt did not attempt to utilize R UE during session with noted inattention to R side. Pt's R hand placed into weight bearing at sink when standing for peri hygiene. Hand over hand assistance to utilize R UE with self care tasks. Pt was able to follow commands and demonstrations for strength testing of R UE with assistance from interpreter. Pt returning to bed at end of session for safety and secondary to fatigue. Call bell and all needed items within reach. Bed alarm activated. OT calling and speaking to son regarding POC, OT purpose, and goals with him verbalizing understanding and no questions at this time. OT also asking family to bring pt's clothing for therapy sessions.  OT Evaluation Precautions/Restrictions  Precautions Precautions: Fall Precaution Comments: R hemiparesis, poor safety  awareness Pain Pain Assessment Pain Scale: Faces Faces Pain Scale: No hurt Home Living/Prior Functioning Home Living Available Help at Discharge: Family, Available 24 hours/day Type of Home: House Home Access: Stairs to enter [employer] of Steps: 2 Home Layout: One level Bathroom Shower/Tub: Chiropodist: Standard  Lives With: Family Prior Function Level of Independence: Independent with basic ADLs, Independent with homemaking with ambulation, Independent with gait, Independent with transfers Vocation: Unemployed Comments: Per pt's son, pt was independent for all ADLs and IADLs Vision Baseline Vision/History: No visual deficits Patient Visual Report: No change from baseline Vision Assessment?: Vision impaired- to be further tested in functional context Additional Comments: R inattention Cognition Overall Cognitive Status: Impaired/Different from baseline Arousal/Alertness: Awake/alert Orientation Level: Person Year: Other (Comment)(all information via interpreter. Pt reports, " I don't know") Month: (" I don't know") Day of Week: Incorrect(wed) Memory: Impaired Memory Impairment: Decreased recall of new information;Decreased short term memory Decreased Short Term Memory: Verbal basic;Functional basic Immediate Memory Recall: (recall 0/0) Memory Recall Sock: Not able to recall Memory Recall Blue: Not able to recall Memory Recall Bed: Not able to recall Attention: Sustained Sustained Attention: Appears intact Awareness: Impaired Awareness Impairment: Intellectual impairment Problem Solving: Impaired Problem Solving Impairment: Functional basic;Verbal basic Safety/Judgment: Impaired Sensation Sensation Light Touch: Impaired by gross assessment Light Touch Impaired Details: Impaired RUE;Impaired RLE Hot/Cold: Appears Intact Proprioception: Impaired by gross assessment Stereognosis: Not tested Coordination Gross Motor Movements are Fluid  and Coordinated: No Fine Motor Movements are Fluid and Coordinated: No Coordination and Movement Description: R hemiplegia Motor  Motor Motor: Hemiplegia Mobility  Bed Mobility Bed Mobility: Supine to Sit;Sit to Supine Supine to Sit: Minimal Assistance - Patient > 75% Sit to Supine: Minimal Assistance - Patient > 75% Transfers Stand to Sit: Moderate Assistance - Patient 50-74%  Trunk/Postural Assessment  Cervical Assessment Cervical Assessment: Within Functional Limits Thoracic Assessment Thoracic Assessment: Within Functional Limits Lumbar Assessment Lumbar Assessment: Within Functional Limits Postural Control Postural Control: Deficits on evaluation  Balance Balance Balance Assessed: Yes Dynamic Sitting  Balance Dynamic Sitting - Balance Support: Feet unsupported;During functional activity Dynamic Sitting - Level of Assistance: 5: Stand by assistance Dynamic Standing Balance Dynamic Standing - Balance Support: During functional activity;No upper extremity supported Dynamic Standing - Level of Assistance: 3: Mod assist;2: Max assist Extremity/Trunk Assessment RUE Assessment RUE Assessment: Exceptions to [facility] Passive Range of Motion (PROM) Comments: WFLs Active Range of Motion (AROM) Comments: distal>proximal General Strength Comments: digits and wrist 3-/5, elbow 2+/5, and shoulder 2-/5. She did not functional use during session but when demonstrated and interpreter asking her to do action she was able to initiate LUE Assessment LUE Assessment: Within Functional Limits     Refer to Care Plan for Long Term Goals  Recommendations for other services: Therapeutic Recreation  Kitchen group   Discharge Criteria: Patient will be discharged from OT if patient refuses treatment 3 consecutive times without medical reason, if treatment goals not met, if there is a change in medical status, if patient makes no progress towards goals or if patient is discharged from hospital.  The  above assessment, treatment plan, treatment alternatives and goals were discussed and mutually agreed upon: by patient and by family  Gypsy Decant 10/22/2019, 2:13 PM

## 2019-10-22 NOTE — Progress Notes (Signed)
Lindsay Ribas, MD  Physician  Physical Medicine and Rehabilitation  PMR Pre-admission  Signed  Date of Service:  10/21/2019 3:56 PM      Related encounter: ED to Hosp-Admission (Discharged) from 10/15/2019 in [facility]      Signed         Show:Clear all [x] Manual[x] Template[x] Copied  Added by: [x] Raulkar, Clide Deutscher, MD[x] Michel Santee, PT  [] Hover for details PMR Admission Coordinator Pre-Admission Assessment  Patient: Lindsay Howard is an 43 y.o., female MRN: [id number] DOB: 1976-10-16 Height: 4' 11"  (149.9 cm) Weight: 63.6 kg  Insurance Information HMO:     PPO: yes     PCP:      IPA:      80/20:      OTHER:  PRIMARY: [employer] [place]      Policy#: [id number]      Subscriber: patient CM Name: Jonelle Sidle      Phone#: [phone]     Fax#: [phone] Pre-Cert#: [id number] [place] for CIR provided by Tiffany for 11/11 admission with updates due to fax listed above on 11/17      Employer:  Benefits:  Phone #: [phone]     Name:  Eff. Date: 05/11/19     Deduct: $1500 ($0 met)      Out of Pocket Max: $4500 ($0 met)      Life Max: n/a CIR: 80%      SNF: 80% Outpatient: 80%     Co-Pay: 20% Home Health: 80%      Co-Pay: 20% DME: 80%     Co-Pay: 20% Providers:  SECONDARY:       Policy#:       Subscriber:  CM Name:       Phone#:      Fax#:  Pre-Cert#:       Employer:  Benefits:  Phone #:      Name:  Eff. Date:      Deduct:       Out of Pocket Max:       Life Max:  CIR:       SNF:  Outpatient:      Co-Pay:  Home Health:       Co-Pay:  DME:      Co-Pay:   Medicaid Application Date:       Case Manager:  Disability Application Date:       Case Worker:   The "Data Collection Information Summary" for patients in Inpatient Rehabilitation Facilities with attached "Privacy Act [place] Records" was provided and verbally reviewed with: N/A  Emergency Contact Information         Contact Information    Name Relation Home Work Mobile    Tanksley,Klaw Niece [phone]        Current Medical History  Patient Admitting Diagnosis: L MCA   History of Present Illness: Lindsay Howard is a 37 year oldnon-english speakingfemalefrom [place]with history of HTN- no medications otherwise in good health who was admitted on 10/15/19 with inability to communicate and moaning incoherently after a nap that evening.She speaks [name] (also Insurance account manager).  UDS negative. 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  She was started on Clevidipine drip for better control. Follow up MRI brain showed stable left thalamic hemorrhage with mild progression of rim of edema. 2D echo showed EF of 60-65%  with normal diastolic/systolic function and no wall abnormality. Bleed felt to be hypertensive in nature and Norvasc started. NGT placed as patient NPO.  F/u MBS done 11/8 and she was started on dysphagia 3, thins as mild aspiration risk. She continues to have expressive aphasia with left lean, right sided weakness.  Therapy evaluations were completed and pt was recommended for CIR.   Complete NIHSS TOTAL: 5  Patient's medical record from [facility] has been reviewed by the rehabilitation admission coordinator and physician.  Past Medical History      Past Medical History:  Diagnosis Date  . Bulky or enlarged uterus     Family History   family history includes Epilepsy in her sister.  Prior Rehab/Hospitalizations Has the patient had prior rehab or hospitalizations prior to admission? No  Has the patient had major surgery during 100 days prior to admission? No              Current Medications  Current Facility-Administered Medications:  .   stroke: mapping our early stages of recovery book, , Does not apply, Once, Beulah Gandy A, NP .  0.9 %  sodium chloride  infusion, , Intravenous, Continuous, Beulah Gandy A, NP, Last Rate: 50 mL/hr at 10/21/19 0150 .  acetaminophen (TYLENOL) tablet 650 mg, 650 mg, Oral, Q4H PRN **OR** acetaminophen (TYLENOL) 160 MG/5ML solution 650 mg, 650 mg, Per Tube, Q4H PRN, 650 mg at 10/17/19 1447 **OR** acetaminophen (TYLENOL) suppository 650 mg, 650 mg, Rectal, Q4H PRN, Beulah Gandy A, NP .  amLODipine (NORVASC) tablet 10 mg, 10 mg, Oral, Daily, Beulah Gandy A, NP, 10 mg at 10/21/19 0926 .  Chlorhexidine Gluconate Cloth 2 % PADS 6 each, 6 each, Topical, Daily, Beulah Gandy A, NP, 6 each at 10/21/19 [phone] .  feeding supplement (ENSURE ENLIVE) (ENSURE ENLIVE) liquid 237 mL, 237 mL, Oral, BID BM, Beulah Gandy A, NP, 237 mL at 10/21/19 0926 .  hydrALAZINE (APRESOLINE) injection 10-20 mg, 10-20 mg, Intravenous, Q4H PRN, Beulah Gandy A, NP .  MEDLINE mouth rinse, 15 mL, Mouth Rinse, BID, Beulah Gandy A, NP, 15 mL at 10/20/19 1041 .  pantoprazole (PROTONIX) EC tablet 40 mg, 40 mg, Oral, QHS, Beulah Gandy A, NP, 40 mg at 10/20/19 2152 .  senna-docusate (Senokot-S) tablet 1 tablet, 1 tablet, Oral, BID, Beulah Gandy A, NP, 1 tablet at 10/21/19 5364  Patients Current Diet:     Diet Order                  Diet regular Room service appropriate? Yes; Fluid consistency: Thin  Diet effective now               Precautions / Restrictions Precautions Precautions: Fall Precaution Comments: R hemiparesis Restrictions Weight Bearing Restrictions: No   Has the patient had 2 or more falls or a fall with injury in the past year? 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 Independent  Indoor Mobility: Did the patient need assistance with walking from room to room (with or without device)? Independent  Stairs: Did the patient need  assistance with internal or external stairs (with or without device)? Independent  Functional Cognition: Did the patient need help planning regular tasks such as shopping or remembering to take medications? Independent  Home Assistive Devices / Equipment Home Assistive Devices/Equipment: None Home Equipment: None  Prior Device Use: Indicate devices/aids used by the patient prior to current illness, exacerbation or injury? None of the above  Current Functional Level Cognition  Arousal/Alertness: Awake/alert Overall Cognitive Status: No family/caregiver present to determine baseline cognitive functioning Difficult to assess due to: Non-English speaking, Impaired communication Current Attention Level: Sustained Orientation Level: Oriented to person, Oriented to place, Oriented to time, Oriented to situation Following Commands: (Pt needed mod demonstrational commands during functional reach with the RUE.) Safety/Judgement: Decreased awareness of safety, Decreased awareness of deficits General Comments: responds well to gesturing.  Did not use interpreter due to aphasia. Attention: Sustained Sustained Attention: Appears intact Memory: (TBA) Awareness: Impaired Awareness Impairment: Intellectual impairment, Emergent impairment, Anticipatory impairment Problem Solving: (TBA) Safety/Judgment: Impaired    Extremity Assessment (includes Sensation/Coordination)  Upper Extremity Assessment: Defer to OT evaluation RUE Deficits / Details: Pt presenting with limited AROM of RUE, decreased grip strength, and poor coordination. Pt required assist to flex shoulder and elbow. Pt not performing thumb opposition with RUE.  RUE Coordination: decreased fine motor, decreased gross motor  Lower Extremity Assessment: RLE deficits/detail RLE Deficits / Details: grossly 4-/5, impaired gross coordination    ADLs  Overall ADL's : Needs assistance/impaired Eating/Feeding: Minimal assistance, Sitting,  Set up Grooming: Wash/dry hands, Minimal assistance Upper Body Bathing: Moderate assistance, Sitting Lower Body Bathing: Sit to/from stand, +2 for physical assistance, +2 for safety/equipment, Maximal assistance, Moderate assistance Lower Body Bathing Details (indicate cue type and reason): When cued to wash legs, pt required increased time and mod VCs to initiate task. Pt attempted figure 4 method for washing legs. Pt required mod A to wash feet and back of legs, and max A +2 for peri-care.  Upper Body Dressing : Sitting, Minimal assistance Lower Body Dressing: Maximal assistance, Sit to/from stand, +2 for physical assistance Lower Body Dressing Details (indicate cue type and reason): Pt required max A to don socks sitting on 3N1, required max A +2 to power up to stand Toilet Transfer: Moderate assistance, BSC, Stand-pivot Toilet Transfer Details (indicate cue type and reason): When walking into bathroom, pt began to urinate on the floor. BSC placed behind the pt and pt required max A +2 to transfer up onto raised BSC.  Toileting- Clothing Manipulation and Hygiene: Moderate assistance, Sit to/from stand Toileting - Clothing Manipulation Details (indicate cue type and reason): Pt required max A +2 for peri-hygiene Functional mobility during ADLs: Maximal assistance, +2 for physical assistance, +2 for safety/equipment General ADL Comments: Worked on neuromuscular re-education for the RUE with functional reach for drinking from cup with mod assist as well as picking up and placing wadded up pieces of paper from the chair to the trash can.  She demonstrates 90% of gross grasp and 80% of release.  Educated pt on self AAROM exercises for shoulder flexion and elbow flexion in the RUE as well.    Mobility  Overal bed mobility: Needs Assistance Bed Mobility: Rolling Rolling: Mod assist Sidelying to sit: Mod assist Supine to sit: Max assist General bed mobility comments: Pt required assistance to move  to edge of bed and to elevate trunk in sitting presents with R sided Lateral lean/LOB.    Transfers  Overall transfer level: Needs assistance Equipment used: None Transfers: Sit to/from Stand, Stand Pivot Transfers Sit to Stand: Min assist Stand pivot transfers: Mod assist General transfer comment: Decreased ability to maintain support on the RLE with weightbearing as well as demonstrating decreased efficiency with active movement.    Ambulation / Gait /  Stairs / Emergency planning/management officer  Ambulation/Gait Ambulation/Gait assistance: Mod assist, +2 safety/equipment Gait Distance (Feet): 65 Feet Assistive device: Right platform walker(youth height) Gait Pattern/deviations: Step-through pattern, Decreased step length - right, Decreased stance time - right, Ataxic, Scissoring General Gait Details: pt with R lateral lean requiring tactile cues for weight shift to the L with R LE advancement, Pt able to advance RLE forward unassisted but required max assistance to maintain path of RW and to manuever RW. Gait velocity: slow Gait velocity interpretation: 1.31 - 2.62 ft/sec, indicative of limited community ambulator    Posture / Balance Dynamic Sitting Balance Sitting balance - Comments: Pt with initial LOB posteriorly when sitting EOC and working on self AAROM exercises Balance Overall balance assessment: Needs assistance Sitting-balance support: Feet unsupported, No upper extremity supported Sitting balance-Leahy Scale: Poor Sitting balance - Comments: Pt with initial LOB posteriorly when sitting EOC and working on self AAROM exercises Postural control: Right lateral lean Standing balance support: Bilateral upper extremity supported, During functional activity Standing balance-Leahy Scale: Poor Standing balance comment: Pt needs use of the therapist and handles of the chair and BSC for balance support during transfers.    Special needs/care consideration BiPAP/CPAP no CPM no Continuous  Drip IV no Dialysis no        Days n/a Life Vest no Oxygen no Special Bed no Trach Size no Wound Vac (area) no      Location n/a Skin intact                              Location  Bowel mgmt: incontinent, no BM since admission Bladder mgmt: incontinent Diabetic mgmt: no Behavioral consideration no Chemo/radiation no Pt is a refugee from [place], has lived in the [place] for 12 years, and in a refugee camp in [place] for 12 years prior to that.    Previous Home Environment (from acute therapy documentation) Living Arrangements: Spouse/significant other, Children Available Help at Discharge: Family, Available 24 hours/day Type of Home: House Home Layout: One level Home Access: Stairs to enter [employer] of Steps: 2 Bathroom Shower/Tub: Chiropodist: [place]: No  Discharge Living Setting Plans for Discharge Living Setting: Patient's home Type of Home at Discharge: House Discharge Home Layout: Multi-level, Bed/bath upstairs Alternate Level Stairs-Rails: Right Alternate Level Stairs-Number of Steps: 3 Discharge Home Access: Stairs to enter Entrance Stairs-Rails: None Entrance Stairs-Number of Steps: 1 Discharge Bathroom Shower/Tub: Tub/shower unit Discharge Bathroom Toilet: Standard Discharge Bathroom Accessibility: Yes How Accessible: Accessible via walker Does the patient have any problems obtaining your medications?: No  Social/Family/Support Systems Patient Roles: Parent Anticipated Caregiver: Klaw Nocito (neice) is main contact who speaks the best English; caregivers will include her as well as pt's sister (No Gurka), son (Prey Res), husband (Taw Moorman) Anticipated Caregiver's Contact Information: Charlynn Grimes [phone] Ability/Limitations of Caregiver: family aware pt will need 24/7 supervision at discharge Caregiver Availability: 24/7 Discharge Plan Discussed with Primary Caregiver: Yes Is Caregiver In Agreement with Plan?: Yes  Does Caregiver/Family have Issues with Lodging/Transportation while Pt is in Rehab?: No  Goals/Additional Needs Patient/Family Goal for Rehab: PT/OT/SPT supervision Expected length of stay: 14-18 days Cultural Considerations: pt/family deny any needs Dietary Needs: d3/thin Special Service Needs: interpreter: [name] Pt/Family Agrees to Admission and willing to participate: Yes Program Orientation Provided & Reviewed with Pt/Caregiver Including Roles  & Responsibilities: Yes  Barriers to Discharge: Insurance for SNF coverage  Decrease burden of Care through  IP rehab admission: n/a  Possible need for SNF placement upon discharge: no  Patient Condition: I have reviewed medical records from [facility], spoken with CM, and patient and family member. I met with patient at the bedside for inpatient rehabilitation assessment.  Patient will benefit from ongoing PT, OT and SLP, can actively participate in 3 hours of therapy a day 5 days of the week, and can make measurable gains during the admission.  Patient will also benefit from the coordinated team approach during an Inpatient Acute Rehabilitation admission.  The patient will receive intensive therapy as well as Rehabilitation physician, nursing, social worker, and care management interventions.  Due to bladder management, bowel management, safety, skin/wound care, disease management, medication administration, pain management and patient education the patient requires 24 hour a day rehabilitation nursing.  The patient is currently min to mod assist with mobility and basic ADLs.  Discharge setting and therapy post discharge home is anticipated.  Patient has agreed to participate in the Acute Inpatient Rehabilitation Program and will admit today.  Preadmission Screen Completed By:  Michel Santee, PT, DPT 10/21/2019 3:57 PM ______________________________________________________________________   Discussed status with Dr. Ranell Patrick on  10/21/19 at [place] PM  and received approval for admission today.  Admission Coordinator:  Michel Santee, PT, DPT time 4:12 PM Sudie Grumbling 10/21/19    Assessment/Plan: Diagnosis: 1. Does the need for close, 24 hr/day Medical supervision in concert with the patient's rehab needs make it unreasonable for this patient to be served in a less intensive setting? Yes 2. Co-Morbidities requiring supervision/potential complications: HTN 3. Due to bowel management, safety, skin/wound care, disease management, medication administration, pain management and patient education, does the patient require 24 hr/day rehab nursing? Yes 4. Does the patient require coordinated care of a physician, rehab nurse, PT, OT, and SLP to address physical and functional deficits in the context of the above medical diagnosis(es)? Yes Addressing deficits in the following areas: balance, endurance, locomotion, strength, transferring, bowel/bladder control, bathing, dressing, feeding, grooming, toileting, cognition, speech, language and psychosocial support 5. Can the patient actively participate in an intensive therapy program of at least 3 hrs of therapy 5 days a week? Yes 6. The potential for patient to make measurable gains while on inpatient rehab is excellent 7. Anticipated functional outcomes upon discharge from inpatient rehab: modified independent PT, modified independent OT, modified independent SLP 8. Estimated rehab length of stay to reach the above functional goals is: 2 weeks 9. Anticipated discharge destination: Home 10. Overall Rehab/Functional Prognosis: excellent   MD Signature: Leeroy Cha, MD        Revision History

## 2019-10-23 ENCOUNTER — Inpatient Hospital Stay (HOSPITAL_COMMUNITY): Payer: BLUE CROSS/BLUE SHIELD | Admitting: Occupational Therapy

## 2019-10-23 ENCOUNTER — Inpatient Hospital Stay (HOSPITAL_COMMUNITY): Payer: BLUE CROSS/BLUE SHIELD | Admitting: Physical Therapy

## 2019-10-23 ENCOUNTER — Inpatient Hospital Stay (HOSPITAL_COMMUNITY): Payer: BLUE CROSS/BLUE SHIELD | Admitting: Speech Pathology

## 2019-10-23 DIAGNOSIS — I610 Nontraumatic intracerebral hemorrhage in hemisphere, subcortical: Secondary | ICD-10-CM | POA: Diagnosis not present

## 2019-10-23 MED ORDER — SENNOSIDES-DOCUSATE SODIUM 8.6-50 MG PO TABS
2.0000 | ORAL_TABLET | Freq: Every day | ORAL | Status: DC
  Administered 2019-10-24 – 2019-11-02 (×11): 2 via ORAL
  Filled 2019-10-23 (×10): qty 2

## 2019-10-23 MED ORDER — POLYETHYLENE GLYCOL 3350 17 G PO PACK
17.0000 g | PACK | Freq: Every day | ORAL | Status: DC
  Administered 2019-10-23 – 2019-11-03 (×9): 17 g via ORAL
  Filled 2019-10-23 (×10): qty 1

## 2019-10-23 NOTE — Progress Notes (Signed)
Social Work Assessment and Plan   Patient Details  Name: Lindsay Howard MRN: [id number] Date of Birth: 1976/03/21  Today's Date: 10/23/2019  Problem List:  Patient Active Problem List   Diagnosis Date Noted  . Impaired mobility and activities of daily living   . Thalamic hemorrhage (HCC) 10/21/2019  . Intracerebral hemorrhage 10/15/2019  . ICH (intracerebral hemorrhage) (HCC) 10/15/2019  . Bulky or enlarged uterus 02/15/2016  . Acanthosis nigricans 02/15/2016  . Obesity 02/15/2016   Past Medical History:  Past Medical History:  Diagnosis Date  . Bulky or enlarged uterus    Past Surgical History:  Past Surgical History:  Procedure Laterality Date  . CESAREAN SECTION  2009, 2004  . CESAREAN SECTION     Social History:  reports that she has never smoked. She has never used smokeless tobacco. She reports previous alcohol use. She reports that she does not use drugs.  Family / Support Systems Marital Status: Married Patient Roles: Spouse, Parent, Other (Comment)(sibling) Spouse/Significant Other: Tan Liew doesn't speak English Children: Prey Res-son 14 yo and an 32 yo son Other Supports: Kathleen Lime Menna-niece [phone] speaks english  No-sister Anticipated Caregiver: Family members will pull together to assist-husband works during he day but others will assist while he is working. Ability/Limitations of Caregiver: Family aware pt will require 24 hr supervision-care at DC Caregiver Availability: 24/7 Family Dynamics: Close knit family who will pull together when one of them is in need. They are very supportive of one another and will make sure pt has what she needs. Involved in the community  Social History Preferred language: Maryagnes Amos Religion:  Cultural Background: From [place] Education: Little education in a refugee camp Read: Yes(little in native language) Write: Producer, television/film/video language) Employment Status: Unemployed Marine scientist Issues: No  issues Guardian/Conservator: None-according to MD pt is not fully capable of making her own decisions at this time. Will need to look toward her husband and other family members. Her son is only 25 yo but is the designated visitor   Abuse/Neglect Abuse/Neglect Assessment Can Be Completed: Yes Physical Abuse: Denies Verbal Abuse: Denies Sexual Abuse: Denies Exploitation of patient/patient's resources: Denies Self-Neglect: Denies  Emotional Status Pt's affect, behavior and adjustment status: Pt does want to improve and regain her independence she was the one to take care of others not being cared for. She is a Mom and wants to get back to this and be at home with her family. She pushes herself maybe too hard and gets exhausted trying to do well in therapies. Recent Psychosocial Issues: healthy prior to admission Psychiatric History: No history deferred depression screen due to doing well but do feel she would benefit from seeing neuro-psych due to young age and hard life she has had. Will get input from team Substance Abuse History: No issues  Patient / Family Perceptions, Expectations & Goals Pt/Family understanding of illness & functional limitations: Pt and niece seem to have a basic understanding of her condition and deficits. Both are encouraged by the progress she has made already and hopeful this will continue while here. Pt is very motivated to do well while here. Premorbid pt/family roles/activities: Wife, Mom, sister, aunt, etc Anticipated changes in roles/activities/participation: resume Pt/family expectations/goals: Pt states"  I do "  Niece states: " We will do what is needed to help her, she would [place]."  [employer]: None Premorbid Home Care/DME Agencies: None Transportation available at discharge: family Resource referrals recommended: Neuropsychology  Discharge Planning Living Arrangements: Spouse/significant  other, Children Support Systems:  Spouse/significant other, Children, Other relatives, Friends/neighbors Type of Residence: Private residence Insurance Resources: Multimedia programmer (specify)(BCBS of [place]) Financial Resources: Family Support Financial Screen Referred: No Living Expenses: Education officer, community Management: Spouse Does the patient have any problems obtaining your medications?: Yes (Describe)(was not seeing a MD) Home Management: Patient does this Patient/Family Preliminary Plans: Return home with husband and children with family members assisting with her care until  she is able to do for herself again. She really wants to get to that level before she leaves here. Sw Barriers to Discharge: Other (comments) Sw Barriers to Discharge Comments: Language barrier Social Work Anticipated Follow Up Needs: HH/OP  Clinical Impression Pleasant female who is very motivated to do well and recover from this stroke. Her family is very involved but niece and 73 yo son are only ones who speak [place]. Will try to obtain PCP for her prior to DC and work on discharge needs.  Elease Hashimoto 10/23/2019, 10:54 AM

## 2019-10-23 NOTE — Progress Notes (Signed)
Speech Language Pathology Daily Session Note  Patient Details  Name: Acquanetta Cabanilla MRN: [id number] Date of Birth: [birth year]  Today's Date: 10/23/2019 SLP Individual Time: 1000-1055 SLP Individual Time Calculation (min): 55 min  Short Term Goals: Week 1: SLP Short Term Goal 1 (Week 1): Patient will demonstrate orientation to place, time and situation with Max A multimodal cues. SLP Short Term Goal 2 (Week 1): Patient will demonstrate functional problem solving for basic and familiar tasks with Max A multimodal cues. SLP Short Term Goal 3 (Week 1): Patient will identify 2 physical and 2 cognitive deficits with Max A multimodal cues. SLP Short Term Goal 4 (Week 1): Patient will recall new, daily information with Max A multimodal cues. SLP Short Term Goal 5 (Week 1): Patient will maximize speech intelligibility to ~90% at the sentence level with use of an increased vocal intensity with Min A verbal cues  Skilled Therapeutic Interventions: Skilled treatment session focused on cognitive goals. Upon arrival, patient verbalizing something in [place]. Video interpreter was utilized and patient was requesting to use the bathroom. Min verbal cues were required for safety due to impulsivity but patient was able to void successfully. Patient participated in a basic PEG board task in which she had to make a pattern that consisted of alternating between 2 colors. Patient completed task with Mod I, however, as patterns increased in complexity, overall Mod A verbal and visual cues were needed to self-monitor and correct errors. Patient required total A for orientation to place, time and situation but external aids were unable to be utilized due to patient being illiterate at baseline. Patient was transferred back to bed at end of session due to back pain, RN made aware. Patient left sitting EOB with RN present. Continue with current plan of care.      Pain Pain in back, RN aware and administered medications. Patient  also transferred back to bed.   Therapy/Group: Individual Therapy  Laster Appling 10/23/2019, 12:45 PM

## 2019-10-23 NOTE — Progress Notes (Signed)
Occupational Therapy Session Note  Patient Details  Name: Lindsay Howard MRN: [id number] Date of Birth: Dec 09, 1976  Today's Date: 10/23/2019 OT Individual Time: 8250-5397 OT Individual Time Calculation (min): 75 min    Short Term Goals: Week 1:  OT Short Term Goal 1 (Week 1): Pt will perform UB dressing with min A overall. OT Short Term Goal 2 (Week 1): Pt will maintain standing balance at sink during grooming tasks with min A. OT Short Term Goal 3 (Week 1): Pt will perform toilet transfer with min A.  Skilled Therapeutic Interventions/Progress Updates:    Interpreter line use for translation during session.  She was up in the wheelchair to start and verbally agreed to participate in shower and dressing tasks.  Mod assist hand held for ambulation from the wheelchair at bedside to the shower bench.  She was able to complete all bathing sit to stand with overall mod assist sit to stand.  She needed mod facilitation for RUE use to wash her left arm and other parts of her body.  She was able to transfer to the wheelchair with min assist stand pivot for dressing tasks at the sink.  Mod demonstrational cueing for hemidressing techniques to start with the right side first.  Most of the time she would attempt the sound left side instead.  Min assist for donning clothing sit to stand, with mod assist for gripper socks.  Finished session with work on stand pivot transfers from bed to the wheelchair and back with mod facilitation.  Pt left up in the wheelchair with call button and phone in reach and safety alarm belt in place.    Therapy Documentation Precautions:  Precautions Precautions: Fall Precaution Comments: R hemiparesis, poor safety awareness Restrictions Weight Bearing Restrictions: No  Pain: Pain Assessment Pain Scale: Faces Faces Pain Scale: No hurt ADL: See Care Tool Section for some details of mobility and selfcare  Therapy/Group: Individual Therapy  Dravin Lance OTR/L 10/23/2019, 5:08  PM

## 2019-10-23 NOTE — Progress Notes (Signed)
Physical Therapy Session Note  Patient Details  Name: Lindsay Howard MRN: [id number] Date of Birth: 05-20-1976  Today's Date: 10/23/2019 PT Individual Time: 0805-0907 PT Individual Time Calculation (min): 62 min   Short Term Goals: Week 1:  PT Short Term Goal 1 (Week 1): Pt will perform supine<>sit with supervision PT Short Term Goal 2 (Week 1): Pt will perform transfers with min assist PT Short Term Goal 3 (Week 1): Pt will ambulate at least 63ft using LRAD with min assist PT Short Term Goal 4 (Week 1): Pt will ascend/descend 4 steps using handrails with min assist  Skilled Therapeutic Interventions/Progress Updates:    Pt received supine in bed with nursing staff present. Audio interpreter (ID # [id number]) present throughout session. Pt agreeable to therapy session stating she is having the most difficulty with her walking. Supine>sit, HOB slightly elevated and using bedrails, with close supervision and increased time. Stand pivot EOB>w/c, no AD, with mod assist for balance while turning. Sitting in w/c at sink performed oral care with assist for toothpaste on brush. Sitting in w/c performed UB and LB dressing with max assist for time management - sit<>stand with min assist to complete pulling pants over hips.  Transported to/from gym in w/c. Ambulated 282ft, no AD, with min/mod assist for balance and progressively increasing assist for RLE swing through - demonstrates ability to swing R LE through during gait requiring assistance to do this with increased distance (less scissoring noted today) and with increased distance has progressively worsening R knee flexion during stance - manual facilitation and tactile cuing for improvement - pt demonstrating improving trunk control this session. Ascended/descended 8 steps using L UE support on each handrail with mod assist for balance and guarding R knee but no buckling noted despite pt ascending and descending with step to pattern on ascent and descent. Repeated R  LE step up/down on 6" step using B UE support on handrails (2nd person assisting with maintaining R hand position) x15reps with min/mod assist for balance and manual facilitation for increased hip/knee extension - max multimodal cuing initially for pt to understand sequencing of stepping. Repeated R LE lateral step up/down on 6" step again with B UE support on handrail and 2nd person assisting with R hand grasp x15 reps and min/mod assist for balance and manual facilitation for improved R hip/knee extension. Ambulated ~274ft, no AD, with min quickly progressing to mod assist for balance due to progressive slight R lateral trunk lean and for improved R LE swing through and manual facilitation for improved R knee extension during stance. Therapist retrieved smaller w/c with a cushion for improved pt fit and pressure relief to allow increased time upright OOB. Pt transported back to room and left seated in w/c with needs in reach, seat belt alarm on, and educated to call for assistance back to bed.   Therapy Documentation Precautions:  Precautions Precautions: Fall Precaution Comments: R hemiparesis, poor safety awareness Restrictions Weight Bearing Restrictions: No  Pain:   Denies pain during session.   Therapy/Group: Individual Therapy  Tawana Scale, PT, DPT 10/23/2019, 7:45 AM

## 2019-10-23 NOTE — Progress Notes (Signed)
[facility] PHYSICAL MEDICINE & REHABILITATION PROGRESS NOTE   Subjective/Complaints: Lindsay Howard states that her chief complaint is her difficulty walking. She denies pain. She does say that she has not had a BM in some time and would like to have one.  Using audio interpreter. PT, nurse, and son are also at patient's bedside.   ROS- denies pain or problems with breathing  Objective:   No results found. Recent Labs    10/21/19 0315 10/22/19 0507  WBC 4.8 5.4  HGB 12.2 12.4  HCT 37.3 37.9  PLT 243 242   Recent Labs    10/21/19 0315 10/22/19 0507  NA 137 137  K 3.4* 3.5  CL 105 105  CO2 22 22  GLUCOSE 94 101*  BUN 9 12  CREATININE 0.64 0.69  CALCIUM 8.9 9.0    Intake/Output Summary (Last 24 hours) at 10/23/2019 0915 Last data filed at 10/22/2019 1809 Gross per 24 hour  Intake 342 ml  Output -  Net 342 ml     Physical Exam: Vital Signs Blood pressure (!) 131/97, pulse 62, temperature 97.9 F (36.6 C), temperature source Oral, resp. rate 14, height 4\' 11"  (1.499 m), weight 61.9 kg, SpO2 99 %.   General: No acute distress Mood and affect are appropriate Heart: Regular rate and rhythm no rubs murmurs or extra sounds Lungs: Clear to auscultation, breathing unlabored, no rales or wheezes Abdomen: Positive bowel sounds, soft nontender to palpation, nondistended Extremities: No clubbing, cyanosis, or edema Skin: No evidence of breakdown, no evidence of rash Neurologic: Cranial nerves II through XII intact, motor strength is 5/5 in left  deltoid, bicep, tricep, grip, hip flexor, knee extensors, ankle dorsiflexor and plantar flexor Sensory exam difficult to ascertain even with interpreter , does feel pinch RUE and RLE  Right side trace finger flexion otherwise 0 RLE 3- KE and ankle DF/PT Unable to perform on Right due to weaknss Good sittin gbalance Musculoskeletal: Full range of motion in all 4 extremities. No joint swelling   Assessment/Plan: 1. 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  Physiatrist is providing close team supervision and 24 hour management of active medical problems listed below.  Physiatrist and rehab team continue to assess barriers to discharge/monitor patient progress toward functional and medical goals  Care Tool:  Bathing    Body parts bathed by patient: Chest, Abdomen, Front perineal area, Right upper leg, Left upper leg, Face   Body parts bathed by helper: Abdomen, Front perineal area, Buttocks, Right upper leg, Left upper leg     Bathing assist Assist Level: Maximal Assistance - Patient 24 - 49%     Upper Body Dressing/Undressing Upper body dressing   What is the patient wearing?: Hospital gown only    Upper body assist Assist Level: Moderate Assistance - Patient 50 - 74%    Lower Body Dressing/Undressing Lower body dressing      What is the patient wearing?: Incontinence brief     Lower body assist Assist for lower body dressing: Maximal Assistance - Patient 25 - 49%     Toileting Toileting    Toileting assist Assist for toileting: Minimal Assistance - Patient > 75%     Transfers Chair/bed transfer  Transfers assist     Chair/bed transfer assist level: Moderate Assistance - Patient 50 - 74%     Locomotion Ambulation   Ambulation assist  Assist level: Moderate Assistance - Patient 50 - 74% Assistive device: Other (comment)(wheelchair) Max distance: 47ft   Walk 10 feet activity   Assist     Assist level: Moderate Assistance - Patient - 50 - 74% Assistive device: Hand held assist   Walk 50 feet activity   Assist    Assist level: Moderate Assistance - Patient - 50 - 74% Assistive device: Hand held assist    Walk 150 feet activity   Assist Walk 150 feet activity did not occur: Safety/medical concerns         Walk 10 feet on uneven surface   activity   Assist Walk 10 feet on uneven surfaces activity did not occur: Safety/medical concerns         Wheelchair     Assist Will patient use wheelchair at discharge?: (TBD; however, anticipate pt will be functional ambulator)      Wheelchair assist level: Moderate Assistance - Patient 50 - 74%      Wheelchair 50 feet with 2 turns activity    Assist            Wheelchair 150 feet activity     Assist          Blood pressure (!) 131/97, pulse 62, temperature 97.9 F (36.6 C), temperature source Oral, resp. rate 14, height 4\' 11"  (1.499 m), weight 61.9 kg, SpO2 99 %.  Medical Problem List and Plan: 1.  Impaired mobility and ADLs secondary to left thalamic hemorrhage             Impairments include expressive aphasia, R sided hemiplegia worse in the upper extremity, R LE ataxia, impaired coordination, impaired motor planning, currently ambulating only with MaxAx2.   Continue PT, OT SLP  2.  Antithrombotics: -DVT/anticoagulation:  Pharmaceutical: Lovenox             -antiplatelet therapy: N/A 3. Pain Management: N/A 4. Mood: LCSW to follow for evaluation and support as able              -antipsychotic agents: N/a 5. Neuropsych: This patient is not capable of making decisions on her own behalf. 6. Skin/Wound Care: Routine pressure relief measures.  7. Fluids/Electrolytes/Nutrition: Intake poor--will ask family to provide meals as able.  8. Hypertension: Patient will benefit from hypertension management education as she was not on any medication prior to this admission. Continue Norvasc 5mg  QD. Goal SBP <140. BP well controlled on 11/13.  Vitals:   10/22/19 2000 10/23/19 0509  BP: 115/78 (!) 131/97  Pulse: 64 62  Resp: 16 14  Temp: 97.6 F (36.4 C) 97.9 F (36.6 C)  SpO2: 100% 99%  controlled 11/12 9. Constipation: On Senna-Docusate BID. Type 4 BM earlier this week. Will make Miralax standing to encourage more regular BM. Can cut back as patient  becomes more mobile.  10. Disposition: Will require follow-up with PCP and Physiatry upon discharge.  11. Education: Will require education regarding lowering her risk of HTN and stroke.     LOS: 2 days A FACE TO FACE EVALUATION WAS PERFORMED  Trisha Morandi P Virgel Haro 10/23/2019, 9:15 AM

## 2019-10-24 ENCOUNTER — Inpatient Hospital Stay (HOSPITAL_COMMUNITY): Payer: BLUE CROSS/BLUE SHIELD | Admitting: Occupational Therapy

## 2019-10-24 ENCOUNTER — Inpatient Hospital Stay (HOSPITAL_COMMUNITY): Payer: BLUE CROSS/BLUE SHIELD | Admitting: Speech Pathology

## 2019-10-24 ENCOUNTER — Inpatient Hospital Stay (HOSPITAL_COMMUNITY): Payer: BLUE CROSS/BLUE SHIELD

## 2019-10-24 DIAGNOSIS — I610 Nontraumatic intracerebral hemorrhage in hemisphere, subcortical: Secondary | ICD-10-CM | POA: Diagnosis not present

## 2019-10-24 DIAGNOSIS — R7401 Elevation of levels of liver transaminase levels: Secondary | ICD-10-CM

## 2019-10-24 DIAGNOSIS — K5901 Slow transit constipation: Secondary | ICD-10-CM | POA: Diagnosis not present

## 2019-10-24 DIAGNOSIS — I10 Essential (primary) hypertension: Secondary | ICD-10-CM

## 2019-10-24 NOTE — Progress Notes (Signed)
Speech Language Pathology Daily Session Note  Patient Details  Name: Lindsay Howard MRN: [id number] Date of Birth: 25-May-1976  Today's Date: 10/24/2019 SLP Individual Time: 3557-3220 SLP Individual Time Calculation (min): 40 min  Short Term Goals: Week 1: SLP Short Term Goal 1 (Week 1): Patient will demonstrate orientation to place, time and situation with Max A multimodal cues. SLP Short Term Goal 2 (Week 1): Patient will demonstrate functional problem solving for basic and familiar tasks with Max A multimodal cues. SLP Short Term Goal 3 (Week 1): Patient will identify 2 physical and 2 cognitive deficits with Max A multimodal cues. SLP Short Term Goal 4 (Week 1): Patient will recall new, daily information with Max A multimodal cues. SLP Short Term Goal 5 (Week 1): Patient will maximize speech intelligibility to ~90% at the sentence level with use of an increased vocal intensity with Min A verbal cues  Skilled Therapeutic Interventions: Skilled treatment session focused on cognitive goals. SLP attempted to use the [facility] interpreter with no [place] interpreter available. Therefore, SLP utilized visual and contextual cues to follow commands. Patient voided successfully on the toilet with Min verbal cues needed for safety with task. Patient also performed oral care at the sink and basic grooming tasks with Mod I. SLP also facilitated session by providing Min A visual cues for patient to self-monitor and correct errors during a pipe tree task. Patient transferred back to bed at end of session and left with alarm on and all needs within reach. Continue with current plan of care.      Pain No/Denies Pain   Therapy/Group: Individual Therapy  [name] 10/24/2019, 9:32 AM

## 2019-10-24 NOTE — Progress Notes (Signed)
Occupational Therapy Session Note  Patient Details  Name: Lindsay Howard MRN: [id number] Date of Birth: 16-Oct-1976  Today's Date: 10/24/2019 OT Individual Time: 1400-1500 OT Individual Time Calculation (min): 60 min    Short Term Goals: Week 1:  OT Short Term Goal 1 (Week 1): Pt will perform UB dressing with min A overall. OT Short Term Goal 2 (Week 1): Pt will maintain standing balance at sink during grooming tasks with min A. OT Short Term Goal 3 (Week 1): Pt will perform toilet transfer with min A.  Skilled Therapeutic Interventions/Progress Updates: focus of session:    Toilet transfer=Min A w/c to/fr toilet and via grab bar; toileting= MinA for cloting mgmt.    Also patient was able to wash periarea standing in front of toilet with fair+balance with Min a for thorough washing and drying.  Patient preferred to sit for grooming of hands and combing hair at sink as she sat back down rather than stand as she incessantly yawned.  She completed Nu Step for neur reducation and reciprocal movements which will also help with cognitive processing.   He independently repositioned her right hand onto handle bar as needed.   She completed self ROM exercises and other left upper extremity neuro re education.  Sit to stand in prep for ADL= close S Static and dynamic sitting balance during self care=close S Dynamic standing balance during tasks/activities=CGA  Patient assisted back to bed to rest at end of session.   Bed alarm and call bell within reach.  Continue OT POC     Therapy Documentation Precautions:  Precautions Precautions: Fall Precaution Comments: R hemiparesis, poor safety awareness Restrictions Weight Bearing Restrictions: No Vital Signs:denied if this clinician understood her nonverbal communication correctly  Therapy/Group: Individual Therapy  Alfredia Ferguson [facility] 10/24/2019, 3:47 PM

## 2019-10-24 NOTE — Progress Notes (Signed)
[facility] PHYSICAL MEDICINE & REHABILITATION PROGRESS NOTE   Subjective/Complaints: Patient seen sitting up at the edge of her bed eating breakfast this morning.  Good sitting balance noted.  No reported issues overnight.  ROS: Limited due to language  Objective:   No results found. Recent Labs    10/22/19 0507  WBC 5.4  HGB 12.4  HCT 37.9  PLT 242   Recent Labs    10/22/19 0507  NA 137  K 3.5  CL 105  CO2 22  GLUCOSE 101*  BUN 12  CREATININE 0.69  CALCIUM 9.0    Intake/Output Summary (Last 24 hours) at 10/24/2019 1314 Last data filed at 10/23/2019 1845 Gross per 24 hour  Intake 120 ml  Output -  Net 120 ml     Physical Exam: Vital Signs Blood pressure 116/76, pulse 70, temperature 98.2 F (36.8 C), temperature source Oral, resp. rate 16, height 4\' 11"  (1.499 m), weight 61.9 kg, SpO2 99 %. Constitutional: No distress . Vital signs reviewed. HENT: Normocephalic.  Atraumatic. Eyes: EOMI. No discharge. Cardiovascular: No JVD. Respiratory: Normal effort.  No stridor. GI: Non-distended. Skin: Warm and dry.  Intact. Psych: Normal mood.  Normal behavior. Musc: No edema in extremities.  No tenderness in extremities. Neurologic: Alert Motor: LUE/LLE appear to be 5/5 proximal distal RUE:?  3/5 proximal distal RLE:?  4 --4/5 proximal distal  Assessment/Plan: 1. 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  Physiatrist is providing close team supervision and 24 hour management of active medical problems listed below.  Physiatrist and rehab team continue to assess barriers to discharge/monitor patient progress toward functional and medical goals  Care Tool:  Bathing    Body parts bathed by patient: Chest, Abdomen, Front perineal area, Right upper leg, Left upper leg, Face, Buttocks, Left arm, Right lower leg, Left lower leg   Body parts bathed by  helper: Right arm     Bathing assist Assist Level: Minimal Assistance - Patient > 75%     Upper Body Dressing/Undressing Upper body dressing   What is the patient wearing?: Bra, Pull over shirt    Upper body assist Assist Level: Moderate Assistance - Patient 50 - 74%    Lower Body Dressing/Undressing Lower body dressing      What is the patient wearing?: Incontinence brief, Pants     Lower body assist Assist for lower body dressing: Moderate Assistance - Patient 50 - 74%     Toileting Toileting    Toileting assist Assist for toileting: 2 Helpers     Transfers Chair/bed transfer  Transfers assist     Chair/bed transfer assist level: Minimal Assistance - Patient > 75%     Locomotion Ambulation   Ambulation assist      Assist level: Minimal Assistance - Patient > 75% Assistive device: No Device Max distance: 200 ft   Walk 10 feet activity   Assist     Assist level: Minimal Assistance - Patient > 75% Assistive device: No Device   Walk 50 feet activity   Assist    Assist level: Minimal Assistance - Patient > 75% Assistive device: No Device    Walk 150 feet activity   Assist Walk 150 feet activity did not occur: Safety/medical concerns  Assist level: Minimal Assistance - Patient > 75% Assistive device: No Device    Walk 10 feet on uneven surface  activity   Assist Walk 10  feet on uneven surfaces activity did not occur: Safety/medical concerns         Wheelchair     Assist Will patient use wheelchair at discharge?: (TBD; however, anticipate pt will be functional ambulator)      Wheelchair assist level: Moderate Assistance - Patient 50 - 74%      Wheelchair 50 feet with 2 turns activity    Assist            Wheelchair 150 feet activity     Assist          Blood pressure 116/76, pulse 70, temperature 98.2 F (36.8 C), temperature source Oral, resp. rate 16, height 4\' 11"  (1.499 m), weight 61.9 kg, SpO2  99 %.  Medical Problem List and Plan: 1.  Impaired mobility and ADLs secondary to left thalamic hemorrhage             Impairments include expressive aphasia, R sided hemiplegia worse in the upper extremity, R LE ataxia, impaired coordination, impaired motor planning, currently ambulating only with MaxAx2.   Continue CIR 2.  Antithrombotics: -DVT/anticoagulation:  Pharmaceutical: Lovenox             -antiplatelet therapy: N/A 3. Pain Management: N/A 4. Mood: LCSW to follow for evaluation and support as able              -antipsychotic agents: N/a 5. Neuropsych: This patient is not capable of making decisions on her own behalf. 6. Skin/Wound Care: Routine pressure relief measures.  7. Fluids/Electrolytes/Nutrition: Intake poor-- family to provide meals as able.  8. Hypertension: Patient will benefit from hypertension management education as she was not on any medication prior to this admission.   Continue Norvasc 5mg  QD. Goal SBP <140.   Vitals:   10/23/19 1952 10/24/19 0503  BP: 138/84 116/76  Pulse: 74 70  Resp: 16 16  Temp: 98 F (36.7 C) 98.2 F (36.8 C)  SpO2: 99% 99%   Controlled 11/14 9. Constipation: On Senna-Docusate BID.  MiraLAX  Improving 10.  Potassium-borderline normal on 11/12  Labs ordered for Monday 11.  Transaminitis  LFTs elevated on 11/12, labs ordered for Monday  LOS: 3 days A FACE TO FACE EVALUATION WAS PERFORMED  Ankit 13/12 10/24/2019, 1:14 PM

## 2019-10-24 NOTE — Progress Notes (Signed)
Physical Therapy Session Note  Patient Details  Name: Lindsay Howard MRN: [id number] Date of Birth: 1976/04/22  Today's Date: 10/24/2019 PT Individual Time: 1050-1200 PT Individual Time Calculation (min): 70 min   Short Term Goals: Week 1:  PT Short Term Goal 1 (Week 1): Pt will perform supine<>sit with supervision PT Short Term Goal 2 (Week 1): Pt will perform transfers with min assist PT Short Term Goal 3 (Week 1): Pt will ambulate at least 20ft using LRAD with min assist PT Short Term Goal 4 (Week 1): Pt will ascend/descend 4 steps using handrails with min assist  Skilled Therapeutic Interventions/Progress Updates:    Pt seated EOB upon PT arrival, agreeable to therapy tx and denies pain. Attempted to use phone interpreter this session by calling [employer] language line- none available for her language. Proceeded with session using gestures and visual cues, therapist pointed to the bathroom and pt nodded her head yes. Stand pivot to w/c with min assist, stand pivot to toilet this session with min assist. Pt performed clothing management with supervision and CGA for standing balance, performed pericare without assist, continent of bladder. Pt transported to the gym.  Pt ambulated 2 x 200 ft without AD and with min assist this session, tactile cues for step length and turning. Pt performed 2 x 10 sit<>stands this session without UE support for LE strengthening, visual cues for techniques. Pt worked on standing balance and R UE NMR to perform overhead reaching task for clothespins, x 2 trials with active assist for R UE reaching. Standing at parallel bars for UE support pt performed R LE hip abduction with visual/tactile cues for techniques x 10, pt performed x 10 sidesteps in place with each LE and visual/tactile cues for techniques, working on hip abductor strength. Pt seated in w/c performed hip abduction with theraband x 15 for strengthening. Pt transferred from w/c>quadruped on the mat with mod  assist (visual demonstration and tactile cues), in quadruped worked on R UE weightbearing with therapist providing UE stabilization at the elbow while performing L UE reaching task with clothespins. Pt transferred back to w/c, worked on R UE NMR to perform pegboard puzzle, active assist for reaching and cues for R UE use. Pt transported back to room and left in w/c with needs in reach and chair alarm set.   Therapy Documentation Precautions:  Precautions Precautions: Fall Precaution Comments: R hemiparesis, poor safety awareness Restrictions Weight Bearing Restrictions: No    Therapy/Group: Individual Therapy  Netta Corrigan, PT, DPT, CSRS 10/24/2019, 10:32 AM

## 2019-10-24 NOTE — IPOC Note (Signed)
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[facility])  Hospital Problems: Principal Problem:   Thalamic hemorrhage ([place]) Active Problems:   Impaired mobility and activities of daily living   Transaminitis   Slow transit constipation   Accelerated hypertension     Functional Problem List: Nursing Bladder, Bowel, Safety  PT Balance, Safety, Sensory, Behavior, Endurance, Motor, Perception  OT Balance, Cognition, Endurance, Motor, Pain, Safety  SLP    TR         Basic ADL's: OT Grooming, Bathing, Dressing, Toileting     Advanced  ADL's: OT       Transfers: PT Bed Mobility, Bed to Chair, Car, Manufacturing systems engineer, Metallurgist: PT Ambulation, Emergency planning/management officer, Stairs     Additional Impairments: OT Fuctional Use of Upper Extremity  SLP Communication, Social Cognition expression Social Interaction, Problem Solving, Memory, Attention, Awareness  TR      Anticipated Outcomes Item Anticipated Outcome  Self Feeding S  Swallowing      Basic self-care  S  Toileting  S   Bathroom Transfers S  Bowel/Bladder  Patient will improve continence of bowel and bladder  Transfers  supervision  Locomotion  supervision using LRAD  Communication  Supervision  Cognition  Mod A  Pain  Patient will maintain pain level of 3 on scale of 0 to 10  Safety/Judgment  Patient will adhere to safety precautions   Therapy Plan: PT Intensity: Minimum of 1-2 x/day ,45 to 90 minutes PT Frequency: 5 out of 7 days PT Duration Estimated Length of Stay: 2.5-3 weeks OT Intensity: Minimum of 1-2 x/day, 45 to 90 minutes OT Frequency: 5 out of 7 days OT Duration/Estimated Length of Stay: 2.5 - 3 wks SLP Intensity: Minumum of 1-2 x/day, 30 to 90 minutes SLP Frequency: 3 to 5 out of 7 days SLP Duration/Estimated Length of Stay: 2 weeks    Team Interventions: Nursing  Interventions Bladder Management, Bowel Management, Medication Management, Patient/Family Education, Discharge Planning  PT interventions Ambulation/gait training, Community reintegration, DME/adaptive equipment instruction, Neuromuscular re-education, Psychosocial support, Stair training, UE/LE Strength taining/ROM, Wheelchair propulsion/positioning, Training and development officer, Discharge planning, Functional electrical stimulation, Pain management, Skin care/wound management, Therapeutic Activities, UE/LE Coordination activities, Cognitive remediation/compensation, Disease management/prevention, Functional mobility training, Patient/family education, Splinting/orthotics, Therapeutic Exercise, Visual/perceptual remediation/compensation  OT Interventions Balance/vestibular training, Neuromuscular re-education, Self Care/advanced ADL retraining, Therapeutic Exercise, Cognitive remediation/compensation, DME/adaptive equipment instruction, Pain management, UE/LE Strength taining/ROM, Community reintegration, Technical sales engineer stimulation, Patient/family education, UE/LE Coordination activities, Discharge planning, Functional mobility training, Psychosocial support, Therapeutic Activities, Visual/perceptual remediation/compensation  SLP Interventions Cognitive remediation/compensation, Internal/external aids, Speech/Language facilitation, Therapeutic Activities, Environmental controls, English as a second language teacher, Patient/family education, Functional tasks  TR Interventions    SW/CM Interventions Discharge Planning, Psychosocial Support, Patient/Family Education   Barriers to Discharge MD  Medical stability, Behavior and New diagnosis hypertension  Nursing Other (comments) language barrier  PT Inaccessible home environment, Home environment access/layout, Decreased caregiver support    OT (none known at this time)    SLP      SW Other (comments) Language barrier   Team Discharge Planning: Destination:  PT-Home ,OT- Home , SLP-Home Projected Follow-up: PT-Home health PT, 24 hour supervision/assistance, OT-  Home health OT, 24 hour supervision/assistance, SLP-Home Health SLP, 24 hour supervision/assistance Projected Equipment Needs: PT-To be determined, OT- To be determined, SLP-None recommended by SLP Equipment Details: PT- , OT-  Patient/family involved in discharge planning: PT- Patient,  OT-Patient, SLP-Patient  MD ELOS: 13-17 days. Medical Rehab Prognosis:  Good Assessment: 43 year old non-english speaking female from [place] with history of HTN- no medications otherwise in good health who was admitted on 10/15/19 with inability to communicate and moaning incoherently after a nap that evening.  She speaks Maryagnes Amos (also called [name]) UDS negative.  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 Follow up MRI brain showed stable left thalamic hemorrhage with mild progression of rim of edema. 2D echo showed EF of 60-65% with normal diastolic/systolic function and no wall abnormality. Bleed felt to be hypertensive in nature and Norvasc. NGT placed as patient NPO, later advanced to D3 thins after MBS and now on regular thins. She continues to have expressive aphasia with left lean, right sided weakness.  We will set goals for supervision with PT/OT and mod A with SLP.  Due to the current state of emergency, patients may not be receiving their 3-hours of Medicare-mandated therapy.  See Team Conference Notes for weekly updates to the plan of care

## 2019-10-25 ENCOUNTER — Inpatient Hospital Stay (HOSPITAL_COMMUNITY): Payer: BLUE CROSS/BLUE SHIELD | Admitting: Physical Therapy

## 2019-10-25 DIAGNOSIS — I10 Essential (primary) hypertension: Secondary | ICD-10-CM

## 2019-10-25 DIAGNOSIS — I610 Nontraumatic intracerebral hemorrhage in hemisphere, subcortical: Secondary | ICD-10-CM | POA: Diagnosis not present

## 2019-10-25 NOTE — Progress Notes (Signed)
[place] PHYSICAL MEDICINE & REHABILITATION PROGRESS NOTE   Subjective/Complaints: Patient seen sitting up at the edge of her bed eating breakfast morning.  Good sitting balance noted.  Patient with limited ability to follow commands.  Per nurse tech patient only repeats 2 words even with interpreter and son present.  ROS: Limited due to language/cognition  Objective:   No results found. No results for input(s): WBC, HGB, HCT, PLT in the last 72 hours. No results for input(s): NA, K, CL, CO2, GLUCOSE, BUN, CREATININE, CALCIUM in the last 72 hours.  Intake/Output Summary (Last 24 hours) at 10/25/2019 1242 Last data filed at 10/25/2019 0744 Gross per 24 hour  Intake 240 ml  Output -  Net 240 ml     Physical Exam: Vital Signs Blood pressure 117/85, pulse 73, temperature 97.7 F (36.5 C), resp. rate 18, height 4\' 11"  (1.499 m), weight 61.9 kg, SpO2 99 %. Constitutional: No distress . Vital signs reviewed. HENT: Normocephalic.  Atraumatic. Eyes: EOMI. No discharge. Cardiovascular: No JVD. Respiratory: Normal effort.  No stridor. GI: Non-distended. Skin: Warm and dry.  Intact. Psych: Normal mood.  Normal behavior. Musc: No edema in extremities.  No tenderness in extremities. Neurologic: Alert Global aphasia Motor: Limited due to participation, however LUE/LLE appear to be 5/5 proximal distal RUE:? 3+/5 proximal distal RLE:?  4 --4/5 proximal distal  Assessment/Plan: 1. 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  Physiatrist is providing close team supervision and 24 hour management of active medical problems listed below.  Physiatrist and rehab team continue to assess barriers to discharge/monitor patient progress toward functional and medical goals  Care Tool:  Bathing    Body parts bathed by patient: Chest, Abdomen, Front perineal area, Right upper leg,  Left upper leg, Face, Buttocks, Left arm, Right lower leg, Left lower leg   Body parts bathed by helper: Right arm     Bathing assist Assist Level: Minimal Assistance - Patient > 75%     Upper Body Dressing/Undressing Upper body dressing   What is the patient wearing?: Bra, Pull over shirt    Upper body assist Assist Level: Moderate Assistance - Patient 50 - 74%    Lower Body Dressing/Undressing Lower body dressing      What is the patient wearing?: Incontinence brief, Pants     Lower body assist Assist for lower body dressing: Moderate Assistance - Patient 50 - 74%     Toileting Toileting    Toileting assist Assist for toileting: 2 Helpers     Transfers Chair/bed transfer  Transfers assist     Chair/bed transfer assist level: Minimal Assistance - Patient > 75%     Locomotion Ambulation   Ambulation assist      Assist level: Minimal Assistance - Patient > 75% Assistive device: No Device Max distance: 200 ft   Walk 10 feet activity   Assist     Assist level: Minimal Assistance - Patient > 75% Assistive device: No Device   Walk 50 feet activity   Assist    Assist level: Minimal Assistance - Patient > 75% Assistive device: No Device    Walk 150 feet activity   Assist Walk 150 feet activity did not occur: Safety/medical concerns  Assist level: Minimal Assistance - Patient > 75% Assistive device: No Device    Walk 10 feet on uneven surface  activity   Assist Walk 10 feet on uneven surfaces  activity did not occur: Safety/medical concerns         Wheelchair     Assist Will patient use wheelchair at discharge?: (TBD; however, anticipate pt will be functional ambulator)      Wheelchair assist level: Moderate Assistance - Patient 50 - 74%      Wheelchair 50 feet with 2 turns activity    Assist            Wheelchair 150 feet activity     Assist          Blood pressure 117/85, pulse 73, temperature 97.7 F  (36.5 C), resp. rate 18, height 4\' 11"  (1.499 m), weight 61.9 kg, SpO2 99 %.  Medical Problem List and Plan: 1.  Impaired mobility and ADLs secondary to left thalamic hemorrhage             Impairments include expressive aphasia, R sided hemiplegia worse in the upper extremity, R LE ataxia, impaired coordination, impaired motor planning, currently ambulating only with MaxAx2.   Continue CIR 2.  Antithrombotics: -DVT/anticoagulation:  Pharmaceutical: Lovenox             -antiplatelet therapy: N/A 3. Pain Management: N/A 4. Mood: LCSW to follow for evaluation and support as able              -antipsychotic agents: N/a 5. Neuropsych: This patient is not capable of making decisions on her own behalf. 6. Skin/Wound Care: Routine pressure relief measures.  7. Fluids/Electrolytes/Nutrition: Intake poor-- family to provide meals as able.  8. Hypertension: Patient will benefit from hypertension management education as she was not on any medication prior to this admission.   Continue Norvasc 5mg  QD. Goal SBP <140.   Vitals:   10/25/19 0521 10/25/19 0906  BP: 123/89 117/85  Pulse: 73   Resp: 18   Temp: 97.7 F (36.5 C)   SpO2: 99%    Controlled 11/15 9. Constipation: On Senna-Docusate BID.  MiraLAX  Improving 10.  Potassium-borderline normal on 11/12  Labs ordered for tomorrow 11.  Transaminitis  LFTs elevated on 11/12, labs ordered for tomorrow  LOS: 4 days A FACE TO FACE EVALUATION WAS PERFORMED  Lindsay Howard 13/12 10/25/2019, 12:42 PM

## 2019-10-25 NOTE — Progress Notes (Signed)
Physical Therapy Session Note  Patient Details  Name: Lindsay Howard MRN: [id number] Date of Birth: July 05, 1976  Today's Date: 10/25/2019 PT Individual Time: 7371-0626 PT Individual Time Calculation (min): 46 min   Short Term Goals: Week 1:  PT Short Term Goal 1 (Week 1): Pt will perform supine<>sit with supervision PT Short Term Goal 2 (Week 1): Pt will perform transfers with min assist PT Short Term Goal 3 (Week 1): Pt will ambulate at least 64ft using LRAD with min assist PT Short Term Goal 4 (Week 1): Pt will ascend/descend 4 steps using handrails with min assist  Skilled Therapeutic Interventions/Progress Updates:    Pt received sitting up in bed and eager to participate in therapy session. Audio interpreter present throughout session. Supine>sitting EOB with supervision. Stand pivot EOB>w/c, no AD, with min assist for balance. Transported to/from bathroom in w/c. Stand pivot w/c<>toilet with min assist for balance. Standing with min assist for balance performed LB clothing management with min assist - continent of bladder and performed seated peri-care with close supervision for sitting balance. Performed hand hygiene sitting in w/c with mod assist. Transported to/from gym in w/c. Ambulated 136ft and 235ft (seated break between), no AD, with min assist for balance due to intermittent minor R lateral trunk lean - tactile cuing for increased R LE foot clearance and step length - pt demonstrating improved R LE foot clearance, increased R knee extension during stance, and improved trunk control - pt demonstrates strong L gaze preference while ambulating with cuing for improvement. At end of 2nd walk when pt turning R to sit in the w/c she had R anterior LOB and therapist unable to pivot her hips into the w/c therefore assisted with slowed descent to the floor for patient safety. Floor transfer to chair with +2 mod assist for patient safety. Vitals assessed: BP 125/89 (MAP 100), HR 71bpm, SpO2 100%. Patient  did not hit her head and denies pain. RN notified. Patient requesting to continue with therapy session as she would like to "keep exercising." Performed dynamic standing balance, R UE NMR, and R attention task of reaching to the R with R lateral weight shift to grasp horseshoes and place them on table in front of patient - min assist for balance and mod assist for R UE movement to reach the horseshoe. Patient requesting to ambulate back to room as she would still like to continue therapy session despite there being no more time. Transported back to room in w/c and pt left seated in w/c with needs in reach and seat belt alarm on.   Therapy Documentation Precautions:  Precautions Precautions: Fall Precaution Comments: R hemiparesis, poor safety awareness Restrictions Weight Bearing Restrictions: No  Pain: Repeatedly denies pain throughout session.   Therapy/Group: Individual Therapy  Tawana Scale, PT, DPT 10/25/2019, 1:04 PM

## 2019-10-25 NOTE — Progress Notes (Signed)
   10/25/19 1657  What Happened  Was fall witnessed? Yes  Who witnessed fall? Therapist  Patients activity before fall during therapy (at 1450)  Point of contact buttocks;hip/leg  Was patient injured? No  Follow Up  MD notified Posey Pronto  Time MD notified 1500  Family notified Yes - comment  Additional tests No  Progress note created (see row info) Yes  Pain Assessment  Pain Scale 0-10  Pain Score 0  Neurological  Neuro (WDL) X  Level of Consciousness Alert  Orientation Level Oriented to person;Oriented to situation;Disoriented to time;Disoriented to place (son here to translate)  Cognition Follows commands  Speech Clear  R Hand Grip Weak  L Hand Grip Moderate   RUE Motor Response Purposeful movement  RUE Motor Strength 2  LUE Motor Response Purposeful movement  LUE Motor Strength 4  RLE Motor Response Purposeful movement  RLE Motor Strength 4  LLE Motor Response Purposeful movement  LLE Motor Strength 4  Musculoskeletal  Musculoskeletal (WDL) X  Assistive Device Wheelchair  Generalized Weakness Yes  Weight Bearing Restrictions No  Musculoskeletal Details  RUE Limited movement;Weakness  Integumentary  Integumentary (WDL) WDL

## 2019-10-26 ENCOUNTER — Inpatient Hospital Stay (HOSPITAL_COMMUNITY): Payer: Self-pay

## 2019-10-26 ENCOUNTER — Inpatient Hospital Stay (HOSPITAL_COMMUNITY): Payer: Self-pay | Admitting: Occupational Therapy

## 2019-10-26 DIAGNOSIS — I610 Nontraumatic intracerebral hemorrhage in hemisphere, subcortical: Secondary | ICD-10-CM | POA: Diagnosis not present

## 2019-10-26 LAB — CBC
HCT: 40 % (ref 36.0–46.0)
Hemoglobin: 12.7 g/dL (ref 12.0–15.0)
MCH: 25.2 pg — ABNORMAL LOW (ref 26.0–34.0)
MCHC: 31.8 g/dL (ref 30.0–36.0)
MCV: 79.5 fL — ABNORMAL LOW (ref 80.0–100.0)
Platelets: 265 10*3/uL (ref 150–400)
RBC: 5.03 MIL/uL (ref 3.87–5.11)
RDW: 15.3 % (ref 11.5–15.5)
WBC: 5.7 10*3/uL (ref 4.0–10.5)
nRBC: 0 % (ref 0.0–0.2)

## 2019-10-26 LAB — COMPREHENSIVE METABOLIC PANEL
ALT: 87 U/L — ABNORMAL HIGH (ref 0–44)
AST: 72 U/L — ABNORMAL HIGH (ref 15–41)
Albumin: 3.5 g/dL (ref 3.5–5.0)
Alkaline Phosphatase: 77 U/L (ref 38–126)
Anion gap: 8 (ref 5–15)
BUN: 13 mg/dL (ref 6–20)
CO2: 28 mmol/L (ref 22–32)
Calcium: 9.4 mg/dL (ref 8.9–10.3)
Chloride: 102 mmol/L (ref 98–111)
Creatinine, Ser: 0.64 mg/dL (ref 0.44–1.00)
GFR calc Af Amer: >60 mL/min (ref >60)
GFR calc non Af Amer: >60 mL/min (ref >60)
Glucose, Bld: 102 mg/dL — ABNORMAL HIGH (ref 70–99)
Potassium: 3.7 mmol/L (ref 3.5–5.1)
Sodium: 138 mmol/L (ref 135–145)
Total Bilirubin: 0.6 mg/dL (ref 0.3–1.2)
Total Protein: 8.8 g/dL — ABNORMAL HIGH (ref 6.5–8.1)

## 2019-10-26 NOTE — Progress Notes (Signed)
Physical Therapy Session Note  Patient Details  Name: Lindsay Howard MRN: [id number] Date of Birth: 1976/07/02  Today's Date: 10/26/2019 PT Individual Time: 1300-1400; 2585-2778 PT Individual Time Calculation (min): 60 min , 15 min  Short Term Goals: Week 1:  PT Short Term Goal 1 (Week 1): Pt will perform supine<>sit with supervision PT Short Term Goal 2 (Week 1): Pt will perform transfers with min assist PT Short Term Goal 3 (Week 1): Pt will ambulate at least 78ft using LRAD with min assist PT Short Term Goal 4 (Week 1): Pt will ascend/descend 4 steps using handrails with min assist  Skilled Therapeutic Interventions/Progress Updates:   tx 1:  Pt resting in bed.  Supine> sit with supervision.  CGA for stand pivot to w/c.  Video interpreter unavailable. No S/S of pain during session.  Toilet transfer for continent voiding, min assist.  Pt performed peri care with set up, and managed clothing iwht min assist.  neuromuscular re-education via multimodal cues for alternating reciprocal movements, bil LEs using [employer] in sitting on seat, resistance 60/sec with bil UE support> 0UE support.    PT added towel pads to bil foot plates to improve pt's seating in w/c.      Gait training with AD through obstacle course of 2 canes and 3 cones, with min/mod assist for balance due to inattention/LOB R.  2nd bout using low bolsters and cones with good clearance of q foot, same balance deficits. Up/down 12 steps 2 rails with step-to pattern on 6" high steps, step through on 3" high steps, min assist.  Seated activity, using bil hands spontaneously to fold pillow cases, wash clothes and towels, in her lap then placing on table to her R.  At end of session, pt in bed with alarm set and needs at hand.  tx 2:  Pt resting in bed.  Her son present; he has limited [place] but interpreted adequately for a tx session.   neuromuscular re-education via multimodal cues , demo for supine: cervical flexion, R/L  straight leg raises, bil bridging, alternating ankle pumps, R scapular protraction.    At end of session, pt resting in bed with alarm set and needs at hand.  Son present.  Therapy Documentation Precautions:  Precautions Precautions: Fall Precaution Comments: R hemiparesis, poor safety awareness Restrictions Weight Bearing Restrictions: No          Therapy/Group: Individual Therapy  Onita Pfluger 10/26/2019, 4:22 PM

## 2019-10-26 NOTE — Progress Notes (Signed)
[facility] PHYSICAL MEDICINE & REHABILITATION PROGRESS NOTE   Subjective/Complaints:  Audio interpreter in use by SLP Pt denies pain   ROS: Limited due to language/cognition- with interpreter denies Breathing issues, no bowel or bladder issues  Objective:   No results found. Recent Labs    10/26/19 0526  WBC 5.7  HGB 12.7  HCT 40.0  PLT 265   Recent Labs    10/26/19 0526  NA 138  K 3.7  CL 102  CO2 28  GLUCOSE 102*  BUN 13  CREATININE 0.64  CALCIUM 9.4    Intake/Output Summary (Last 24 hours) at 10/26/2019 0912 Last data filed at 10/25/2019 2300 Gross per 24 hour  Intake 360 ml  Output -  Net 360 ml     Physical Exam: Vital Signs Blood pressure 117/81, pulse 71, temperature 98.1 F (36.7 C), temperature source Oral, resp. rate 18, height 4\' 11"  (1.499 m), weight 61.9 kg, SpO2 99 %. Constitutional: No distress . Vital signs reviewed. HENT: Normocephalic.  Atraumatic. Eyes: EOMI. No discharge. Cardiovascular: No JVD. Respiratory: Normal effort.  No stridor. GI: Non-distended. Skin: Warm and dry.  Intact. Psych: Normal mood.  Normal behavior. Musc: No edema in extremities.  No tenderness in extremities. Neurologic: Alert Global aphasia Motor: Limited due to participation, however LUE/LLE appear to be 5/5 proximal distal RUE:? 3+/5 proximal distal RLE:?  4 --4/5 proximal distal  Assessment/Plan: 1. 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  Physiatrist is providing close team supervision and 24 hour management of active medical problems listed below.  Physiatrist and rehab team continue to assess barriers to discharge/monitor patient progress toward functional and medical goals  Care Tool:  Bathing    Body parts bathed by patient: Chest, Abdomen, Front perineal area, Right upper leg, Left upper leg, Face, Buttocks, Left arm, Right lower  leg, Left lower leg   Body parts bathed by helper: Right arm     Bathing assist Assist Level: Minimal Assistance - Patient > 75%     Upper Body Dressing/Undressing Upper body dressing   What is the patient wearing?: Bra, Pull over shirt    Upper body assist Assist Level: Moderate Assistance - Patient 50 - 74%    Lower Body Dressing/Undressing Lower body dressing      What is the patient wearing?: Incontinence brief, Pants     Lower body assist Assist for lower body dressing: Moderate Assistance - Patient 50 - 74%     Toileting Toileting    Toileting assist Assist for toileting: Minimal Assistance - Patient > 75%     Transfers Chair/bed transfer  Transfers assist     Chair/bed transfer assist level: Minimal Assistance - Patient > 75%     Locomotion Ambulation   Ambulation assist      Assist level: Minimal Assistance - Patient > 75% Assistive device: No Device Max distance: 248ft   Walk 10 feet activity   Assist     Assist level: Minimal Assistance - Patient > 75% Assistive device: No Device   Walk 50 feet activity   Assist    Assist level: Minimal Assistance - Patient > 75% Assistive device: No Device    Walk 150 feet activity   Assist Walk 150 feet activity did not occur: Safety/medical concerns  Assist level: Minimal Assistance - Patient > 75% Assistive device: No Device    Walk 10 feet on uneven surface  activity   Assist Walk 10 feet on uneven surfaces activity did not occur: Safety/medical concerns         Wheelchair     Assist Will patient use wheelchair at discharge?: (TBD; however, anticipate pt will be functional ambulator)      Wheelchair assist level: Moderate Assistance - Patient 50 - 74%      Wheelchair 50 feet with 2 turns activity    Assist            Wheelchair 150 feet activity     Assist          Blood pressure 117/81, pulse 71, temperature 98.1 F (36.7 C), temperature source  Oral, resp. rate 18, height 4\' 11"  (1.499 m), weight 61.9 kg, SpO2 99 %.  Medical Problem List and Plan: 1.  Impaired mobility and ADLs secondary to left thalamic hemorrhage             Impairments include expressive aphasia, R sided hemiplegia worse in the upper extremity, R LE ataxia, impaired coordination, impaired motor planning, currently ambulating only with MaxAx2.   Continue CIR PT,OT 2.  Antithrombotics: -DVT/anticoagulation:  Pharmaceutical: Lovenox             -antiplatelet therapy: N/A 3. Pain Management: N/A 4. Mood: LCSW to follow for evaluation and support as able              -antipsychotic agents: N/a 5. Neuropsych: This patient is not capable of making decisions on her own behalf. 6. Skin/Wound Care: Routine pressure relief measures.  7. Fluids/Electrolytes/Nutrition: Intake poor-- family to provide meals as able.  8. Hypertension: Patient will benefit from hypertension management education as she was not on any medication prior to this admission.   Continue Norvasc 5mg  QD. Goal SBP <140.   Vitals:   10/25/19 1924 10/26/19 0642  BP: 122/77 117/81  Pulse: 68 71  Resp: 16 18  Temp: 98 F (36.7 C) 98.1 F (36.7 C)  SpO2: 98% 99%   Controlled 11/16 9. Constipation: On Senna-Docusate BID.  MiraLAX  Improving 10.  Potassium-NORMAL 3.7   11.  Transaminitis  ALT, AST Stable   LOS: 5 days A FACE TO FACE EVALUATION WAS PERFORMED  Charlett Blake 10/26/2019, 9:12 AM

## 2019-10-26 NOTE — Progress Notes (Signed)
Occupational Therapy Session Note  Patient Details  Name: Lindsay Howard MRN: [id number] Date of Birth: 04-12-1976  Today's Date: 10/26/2019 OT Individual Time: 1000-1100 OT Individual Time Calculation (min): 60 min    Short Term Goals: Week 1:  OT Short Term Goal 1 (Week 1): Pt will perform UB dressing with min A overall. OT Short Term Goal 2 (Week 1): Pt will maintain standing balance at sink during grooming tasks with min A. OT Short Term Goal 3 (Week 1): Pt will perform toilet transfer with min A.  Skilled Therapeutic Interventions/Progress Updates:    Patient seated in bed, alert.  Used language line for introductions, general assessment and initial instruction.  Patient completed bed mobility with CS.  SPT and ambulation in room without AD to/from bed, toilet, w/c, arm chair, shower bench with CGA/min A.  Patient completed toileting with CS/CGA, bathing seated on shower bench with min A (uses right UE to assist with bathing), UB dressing seated on arm chair CS, LB dressing min A.  Patient completed UB coordination and reaching activities in unsupported sitting with min cues and occ tactile input for Right UE - min difficulty with grasp and release of 1-3 " objects.  Moderate difficulty with manipulation of objects in right hand.  Completed folding task and [place] activities with [place] to initiate.  She returned to bed at close of session.  Bed alarm set and call bell in reach.    Therapy Documentation Precautions:  Precautions Precautions: Fall Precaution Comments: R hemiparesis, poor safety awareness Restrictions Weight Bearing Restrictions: No General:   Vital Signs:   Pain: Pain Assessment Pain Scale: 0-10 Pain Score: 0-No pain Faces Pain Scale: No hurt Other Treatments:     Therapy/Group: Individual Therapy  Carlos Levering 10/26/2019, 12:26 PM

## 2019-10-26 NOTE — Progress Notes (Signed)
Speech Language Pathology Daily Session Note  Patient Details  Name: Lindsay Howard MRN: [id number] Date of Birth: 1976/03/29  Today's Date: 10/26/2019 SLP Individual Time: 0903-1000 SLP Individual Time Calculation (min): 57 min  Short Term Goals: Week 1: SLP Short Term Goal 1 (Week 1): Patient will demonstrate orientation to place, time and situation with Max A multimodal cues. SLP Short Term Goal 2 (Week 1): Patient will demonstrate functional problem solving for basic and familiar tasks with Max A multimodal cues. SLP Short Term Goal 3 (Week 1): Patient will identify 2 physical and 2 cognitive deficits with Max A multimodal cues. SLP Short Term Goal 4 (Week 1): Patient will recall new, daily information with Max A multimodal cues. SLP Short Term Goal 5 (Week 1): Patient will maximize speech intelligibility to ~90% at the sentence level with use of an increased vocal intensity with Min A verbal cues  Skilled Therapeutic Interventions: Skilled ST services focused on cognitive skills. SLP utilized [employer] audio interpreter, although noted was disconnected and had to call twice. SLP facilitated orientation and recall skills, pt was orginialy orientated to state only and following extensive instruction, demonstrated orientation/recall with 10 minutes delay of place (state, city and type pf facility) only, unable to recall situation or time (month/year), stating " I don't know." Pt initially denied any physical or cognitive changes for acute infarct, repeating " I want to go home" however after requiring max A verbal cues to list months of the year, agreed she would be able to demonstrate skill prior to hospitalization. SLP facilitated basic problem solving with familiar task, basic PEG design, pt demonstrated ability to alternate between two colors, however required max A verbal/visucal cues for error awareness and planning coordinations. Pt was handed off to OT. ST recommends to continue skilled ST services.       Pain Pain Assessment Pain Scale: 0-10 Pain Score: 0-No pain  Therapy/Group: Individual Therapy  Lindsay Howard  [facility] 10/26/2019, 12:54 PM

## 2019-10-27 ENCOUNTER — Inpatient Hospital Stay (HOSPITAL_COMMUNITY): Payer: BLUE CROSS/BLUE SHIELD | Admitting: Physical Therapy

## 2019-10-27 ENCOUNTER — Inpatient Hospital Stay (HOSPITAL_COMMUNITY): Payer: BLUE CROSS/BLUE SHIELD | Admitting: Occupational Therapy

## 2019-10-27 ENCOUNTER — Inpatient Hospital Stay (HOSPITAL_COMMUNITY): Payer: BLUE CROSS/BLUE SHIELD

## 2019-10-27 DIAGNOSIS — I610 Nontraumatic intracerebral hemorrhage in hemisphere, subcortical: Secondary | ICD-10-CM | POA: Diagnosis not present

## 2019-10-27 NOTE — Progress Notes (Signed)
[facility] PHYSICAL MEDICINE & REHABILITATION PROGRESS NOTE   Subjective/Complaints:  Son at bedside able to translate , eating breakfast asking to go home   ROS: Limited due to language/cognition- with interpreter denies Breathing issues, no bowel or bladder issues  Objective:   No results found. Recent Labs    10/26/19 0526  WBC 5.7  HGB 12.7  HCT 40.0  PLT 265   Recent Labs    10/26/19 0526  NA 138  K 3.7  CL 102  CO2 28  GLUCOSE 102*  BUN 13  CREATININE 0.64  CALCIUM 9.4    Intake/Output Summary (Last 24 hours) at 10/27/2019 0743 Last data filed at 10/26/2019 1854 Gross per 24 hour  Intake 760 ml  Output -  Net 760 ml     Physical Exam: Vital Signs Blood pressure 116/76, pulse 69, temperature (!) 97.5 F (36.4 C), temperature source Oral, resp. rate 17, height 4\' 11"  (1.499 m), weight 61.9 kg, SpO2 99 %. Constitutional: No distress . Vital signs reviewed. HENT: Normocephalic.  Atraumatic. Eyes: EOMI. No discharge. Cardiovascular: No JVD. Respiratory: Normal effort.  No stridor. GI: Non-distended. Skin: Warm and dry.  Intact. Psych: Normal mood.  Normal behavior. Musc: No edema in extremities.  No tenderness in extremities. Neurologic: Alert Global aphasia Motor: Limited due to participation, however LUE/LLE appear to be 5/5 proximal distal RUE:? 3+/5 proximal distal RLE:?  4 --4/5 proximal distal  Assessment/Plan: 1. 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  Physiatrist is providing close team supervision and 24 hour management of active medical problems listed below.  Physiatrist and rehab team continue to assess barriers to discharge/monitor patient progress toward functional and medical goals  Care Tool:  Bathing    Body parts bathed by patient: Chest, Abdomen, Front perineal area, Right upper leg, Left upper leg, Face,  Buttocks, Left arm, Right lower leg, Left lower leg, Right arm   Body parts bathed by helper: Right arm     Bathing assist Assist Level: Minimal Assistance - Patient > 75%     Upper Body Dressing/Undressing Upper body dressing   What is the patient wearing?: Pull over shirt    Upper body assist Assist Level: Supervision/Verbal cueing    Lower Body Dressing/Undressing Lower body dressing      What is the patient wearing?: Underwear/pull up, Skirt     Lower body assist Assist for lower body dressing: Minimal Assistance - Patient > 75%     Toileting Toileting    Toileting assist Assist for toileting: Contact Guard/Touching assist     Transfers Chair/bed transfer  Transfers assist     Chair/bed transfer assist level: Minimal Assistance - Patient > 75%     Locomotion Ambulation   Ambulation assist      Assist level: Minimal Assistance - Patient > 75% Assistive device: No Device Max distance: 274ft   Walk 10 feet activity   Assist     Assist level: Minimal Assistance - Patient > 75% Assistive device: No Device   Walk 50 feet activity   Assist    Assist level: Minimal Assistance - Patient > 75% Assistive device: No Device    Walk 150 feet activity   Assist Walk 150 feet activity did not occur: Safety/medical concerns  Assist level: Minimal Assistance - Patient > 75% Assistive device: No Device    Walk 10 feet on uneven surface  activity  Assist Walk 10 feet on uneven surfaces activity did not occur: Safety/medical concerns         Wheelchair     Assist Will patient use wheelchair at discharge?: (TBD; however, anticipate pt will be functional ambulator)      Wheelchair assist level: Moderate Assistance - Patient 50 - 74%      Wheelchair 50 feet with 2 turns activity    Assist            Wheelchair 150 feet activity     Assist          Blood pressure 116/76, pulse 69, temperature (!) 97.5 F (36.4 C),  temperature source Oral, resp. rate 17, height 4\' 11"  (1.499 m), weight 61.9 kg, SpO2 99 %.  Medical Problem List and Plan: 1.  Impaired mobility and ADLs secondary to left thalamic hemorrhage             Impairments include expressive aphasia, R sided hemiplegia worse in the upper extremity, R LE ataxia, impaired coordination, impaired motor planning,   Continue CIR PT,OT, team conf in am  2.  Antithrombotics: -DVT/anticoagulation:  Pharmaceutical: Lovenox             -antiplatelet therapy: N/A 3. Pain Management: N/A 4. Mood: LCSW to follow for evaluation and support as able              -antipsychotic agents: N/a 5. Neuropsych: This patient is not capable of making decisions on her own behalf. 6. Skin/Wound Care: Routine pressure relief measures.  7. Fluids/Electrolytes/Nutrition: Intake poor-- family to provide meals as able.  8. Hypertension: Patient will benefit from hypertension management education as she was not on any medication prior to this admission.   Continue Norvasc 5mg  QD. Goal SBP <140.   Vitals:   10/26/19 1916 10/27/19 0554  BP: 117/79 116/76  Pulse: 67 69  Resp: 18 17  Temp: 97.7 F (36.5 C) (!) 97.5 F (36.4 C)  SpO2: 100% 99%   Controlled 11/17 9. Constipation: On Senna-Docusate BID.  MiraLAX  Improving 10.  Potassium-NORMAL 3.7   11.  Transaminitis  ALT, AST Stable   LOS: 6 days A FACE TO FACE EVALUATION WAS PERFORMED  Charlett Blake 10/27/2019, 7:43 AM

## 2019-10-27 NOTE — Progress Notes (Signed)
Physical Therapy Session Note  Patient Details  Name: Lindsay Howard MRN: [id number] Date of Birth: 10-Mar-1976  Today's Date: 10/27/2019 PT Individual Time: 0805-0922 PT Individual Time Calculation (min): 77 min   Short Term Goals: Week 1:  PT Short Term Goal 1 (Week 1): Pt will perform supine<>sit with supervision PT Short Term Goal 2 (Week 1): Pt will perform transfers with min assist PT Short Term Goal 3 (Week 1): Pt will ambulate at least 1ft using LRAD with min assist PT Short Term Goal 4 (Week 1): Pt will ascend/descend 4 steps using handrails with min assist  Skilled Therapeutic Interventions/Progress Updates:    Pt received supine in bed with her son present to begin interpretation while calling for audio interpreter - pt agreeable to therapy session. Pt's son inquiring about a note for pt's husband's work to allow him time off to visit the patient - therapist notified Becky, SW. Supine>sit with supervision. Stand pivot EOB>w/c, no AD, with min assist for balance. Transported to/from bathroom in w/c. Stand pivot w/c<>toilet using grab bars with min assist for balance. Standing LB clothing management with CGA for steadying - continent of bowel and performed seated peri-care with set-up assist. No interpreter available at this time. Transported to/from gym in w/c. Ambulated 284ft, no AD, with min assist for balance primarily for trunk control. Dynamic standing balance and bimanual task of cross body chops while holding a beach ball targeting R UE NMR, balance with trunk control, and R attention. Audio interpreter [phone]) available and present for remainder of session. Standing>tall kneeling on mat table with min assist for balance and cuing for R hemibody attention. Tall kneeling on mat table targeting trunk control and R UE NMR of grasping and relocating cups outside BOS to the R with min assist for balance. Tall kneeling>sitting EOM with min assist for balance. Sitting trunk control and R UE NMR  task of cross body reaching to grasp and relocate cones - CGA for sitting balance and progressively increased from no to min/mod assist for R UE movement due to muscle fatigue - pt reports some discomfort/pain in R hand from increased use. Pt requesting to ambulate more. Ambulated, no AD, 445ft with min assist for balance while grasping horshoes in R visual field with R UE. Patient still not oriented to situation with therapist educating her on the stroke's effects on her R hemibody paresis. Therapist educated pt regarding team conference tomorrow (Wednesday) to discuss pt's progress and therapy POC and the need for hands-on family education prior to discharge home to increase pt safety. Transported back to room in w/c and pt left seated in w/c with needs in reach and seat belt alarm on.    Therapy Documentation Precautions:  Precautions Precautions: Fall Precaution Comments: R hemiparesis, poor safety awareness Restrictions Weight Bearing Restrictions: No  Pain:   Reports some knee discomfort during tall kneeling (primarily L LE) - provided seated rest break for pain relief.    Therapy/Group: Individual Therapy  Tawana Scale, PT, DPT 10/27/2019, 7:49 AM

## 2019-10-27 NOTE — Progress Notes (Signed)
Social Work Patient ID: Lindsay Howard, female   DOB: Nov 09, 1976, 43 y.o.   MRN: [id number]  Letter left for son to give to Dad for excuse from work to come in and see pt and observe her in therapies. Will need to begin education in preparation for home due to pt wants to go home soon. Team conference tomorrow.

## 2019-10-27 NOTE — Progress Notes (Signed)
Speech Language Pathology Daily Session Note  Patient Details  Name: Lindsay Howard MRN: [id number] Date of Birth: 04-Oct-1976  Today's Date: 10/27/2019 SLP Individual Time: 1000-1100 SLP Individual Time Calculation (min): 60 min  Short Term Goals: Week 1: SLP Short Term Goal 1 (Week 1): Patient will demonstrate orientation to place, time and situation with Max A multimodal cues. SLP Short Term Goal 2 (Week 1): Patient will demonstrate functional problem solving for basic and familiar tasks with Max A multimodal cues. SLP Short Term Goal 3 (Week 1): Patient will identify 2 physical and 2 cognitive deficits with Max A multimodal cues. SLP Short Term Goal 4 (Week 1): Patient will recall new, daily information with Max A multimodal cues. SLP Short Term Goal 5 (Week 1): Patient will maximize speech intelligibility to ~90% at the sentence level with use of an increased vocal intensity with Min A verbal cues  Skilled Therapeutic Interventions: Skilled ST services focused on cognitive skills. SLP utilized [employer]us audio interpreter, however had to wait for 20 minutes for an interpreter to be available. SLP facilitated basic problem solving skills without interpreter present utilizing 3 step sequence picture cards, pt required total A visual cues following initial demonstration for each trial. Once the interpreter came available, demonstrated ability to sequence familiar 3 step cards with max A verbal cues and novel 2 step sequence cards with mod A verbal cues for problem solving. Pt demonstrated greater verbal problem solving compared to function during card sequence task, with no awareness of errors. Pt required mod A verbal cues to increase speech intelligibility. Pt was orientated to place and situation, but not time. Pt was agreeable to changes in body, but appeared unsure of cognitive changes " I dont know." Pt indicated via gestures she wanted to use the bathroom, SLP instructed pt to utilize call bell and  demonstrated ability to press button with visual cues. Pt was left in room with call bell within reach and bed alarm set. ST recommends to continue skilled ST services.      Pain Pain Assessment Pain Scale: Faces Pain Score: 0-No pain Faces Pain Scale: No hurt  Therapy/Group: Individual Therapy  Regan Mcbryar  [facility] 10/27/2019, 12:50 PM

## 2019-10-27 NOTE — Progress Notes (Signed)
Occupational Therapy Session Note  Patient Details  Name: Lindsay Howard MRN: [id number] Date of Birth: 1976/08/02  Today's Date: 10/27/2019 OT Individual Time: 1400-1450 OT Individual Time Calculation (min): 50 min    Short Term Goals: Week 1:  OT Short Term Goal 1 (Week 1): Pt will perform UB dressing with min A overall. OT Short Term Goal 2 (Week 1): Pt will maintain standing balance at sink during grooming tasks with min A. OT Short Term Goal 3 (Week 1): Pt will perform toilet transfer with min A. Week 2:     Skilled Therapeutic Interventions/Progress Updates:    1:1 Interpreter services used with [employer] but difficulties with the services intrpreting all my statements/ requests.   Self care retraining at shower level. Pt ambulated into the bathroom with RW with contact guard. Pt able to doff clothing with setup as well as perform toileting/ toilet transfers with close supervision. Pt showered with contact guard sit to stand. Encourage/ environmental cues to focus on heavy use of right hand in session. In conversation with her about her hand dominance she reports she has had no formal schooling and "uses both hands." Pt able to dress underwear, shirt, pants and socks with supervision with use of right hand at diminished level. Pt left resting in bed at end of session.   Therapy Documentation Precautions:  Precautions Precautions: Fall Precaution Comments: R hemiparesis, poor safety awareness Restrictions Weight Bearing Restrictions: No General:   Vital Signs: Therapy Vitals Temp: 97.6 F (36.4 C) Pulse Rate: 68 Resp: 16 BP: 118/83 Patient Position (if appropriate): Sitting Oxygen Therapy SpO2: 100 % O2 Device: Room Air Pain: Pain Assessment Pain Score: 0-No pain   Therapy/Group: Individual Therapy  Willeen Cass [facility] 10/27/2019, 2:57 PM

## 2019-10-28 ENCOUNTER — Inpatient Hospital Stay (HOSPITAL_COMMUNITY): Payer: BLUE CROSS/BLUE SHIELD

## 2019-10-28 ENCOUNTER — Inpatient Hospital Stay (HOSPITAL_COMMUNITY): Payer: BLUE CROSS/BLUE SHIELD | Admitting: Occupational Therapy

## 2019-10-28 ENCOUNTER — Inpatient Hospital Stay (HOSPITAL_COMMUNITY): Payer: BLUE CROSS/BLUE SHIELD | Admitting: Physical Therapy

## 2019-10-28 DIAGNOSIS — I610 Nontraumatic intracerebral hemorrhage in hemisphere, subcortical: Secondary | ICD-10-CM | POA: Diagnosis not present

## 2019-10-28 MED ORDER — GABAPENTIN 100 MG PO CAPS
100.0000 mg | ORAL_CAPSULE | Freq: Three times a day (TID) | ORAL | Status: DC
  Administered 2019-10-28 – 2019-11-03 (×18): 100 mg via ORAL
  Filled 2019-10-28 (×18): qty 1

## 2019-10-28 MED ORDER — LIP MEDEX EX OINT
TOPICAL_OINTMENT | CUTANEOUS | Status: DC | PRN
  Filled 2019-10-28: qty 6.3

## 2019-10-28 NOTE — Progress Notes (Signed)
Speech Language Pathology Daily Session Note  Patient Details  Name: Lindsay Howard MRN: [id number] Date of Birth: 05/17/1976  Today's Date: 10/28/2019 SLP Individual Time: 0109-3235 SLP Individual Time Calculation (min): 56 min  Short Term Goals: Week 1: SLP Short Term Goal 1 (Week 1): Patient will demonstrate orientation to place, time and situation with Max A multimodal cues. SLP Short Term Goal 2 (Week 1): Patient will demonstrate functional problem solving for basic and familiar tasks with Max A multimodal cues. SLP Short Term Goal 3 (Week 1): Patient will identify 2 physical and 2 cognitive deficits with Max A multimodal cues. SLP Short Term Goal 4 (Week 1): Patient will recall new, daily information with Max A multimodal cues. SLP Short Term Goal 5 (Week 1): Patient will maximize speech intelligibility to ~90% at the sentence level with use of an increased vocal intensity with Min A verbal cues  Skilled Therapeutic Interventions: Skilled ST services focused on cognitive skills. Stratus audio interpretor was used in treatment session. Pt requested to use bathroom via gestures, SLP assisted via WC per safety plan, pt required min A verbal cues for problem solving. Pt washed hands and face at sink with mod A verbal cues for problem solving and mod A verbal cues to locate objects on right of midline at sink. SLP facilitated basic problem solving skills during functional self-feeding for breakfast tray, pt required mod A fade to supervision A verbal cues for problem solving and locating objects right of midline. Pt required tray set up,m cutting food items and consumed large bites, however swallow function remained appropriate. Pt was orientated to place (clinic) and awareness of 1 physical deficit, right side weakness ("arm heavy") and 1 cognitive deficit ("I can't remember"), however pt continues to demonstrate continued lack of insight into impact of acute deficits. Pt demonstrated ability to return  demonstration with use of call bell x4. SLP educated pt about team conference being conducted today, in which a discharge date will be approximatd and the need for family education with husband piror to discharge. Pt was left in room with call bell within reach and chair alarm set. ST recommends to continue skilled ST services.      Pain Pain Assessment Pain Score: 0-No pain  Therapy/Group: Individual Therapy  Anedra Penafiel  [facility] 10/28/2019, 10:38 AM

## 2019-10-28 NOTE — Progress Notes (Signed)
Occupational Therapy Weekly Progress Note  Patient Details  Name: Lindsay Howard MRN: [id number] Date of Birth: 03-12-1976  Beginning of progress report period: October 22, 2019 End of progress report period: October 28, 2019  Today's Date: 10/28/2019 OT Individual Time: 1425-1531 OT Individual Time Calculation (min): 66 min    Patient has met 3 of 3 short term goals.  [name] is making steady progress with OT at this time.  She is able to complete functional transfers with min hand held assist.  She is able to perform UB selfcare with supervision, and can complete LB selfcare with min guard assist to stand.  Min assist for shower transfers as well.  She uses the RUE as an active assist for holding the washcloth and washing the LUE with supervision and increased time.  She still demonstrates decreased ability to pull up clothing with the UE secondary to weak grasp.  She needs min assist for arm support with functional reaching to the sink.  Feel she is on target to meet supervision level goals in preparation for home.  Will continue with current OT POC until expected discharge on 11/28.   Pt demonstrates the following deficits: muscle weakness, impaired timing and sequencing, unbalanced muscle activation and decreased coordination, decreased attention to right and decreased standing balance and decreased balance strategies and therefore will continue to benefit from skilled OT intervention to enhance overall performance with BADL and Reduce care partner burden.  Patient progressing toward long term goals..  Continue plan of care.  OT Short Term Goals Week 2:  OT Short Term Goal 1 (Week 2): Pt will continue working toward established LTGs set at supervision level overall.  Skilled Therapeutic Interventions/Progress Updates:    Pt completed bathing and dressing to start session.  She was able to ambulate with min hand held assist to the toilet for toileting prior to transferring over to the shower  bench.  She was able to complete bathing with supervision for UB and min assist for LB.  She used the RUE for washing with supervision throughout the task.  She transferred out to the wheelchair with min assist for dressing.  All UB dressing was performed at supervision.  She then completed LB dressing with min guard assist sit to stand.  Grooming tasks of applying lotion to her arms and combing her hair were completed with supervision.  For the next part of the session, she was transported down to the therapy gym for work on Robinson coordination and functional reach.  Had pt sit on the therapy mat and work on picking up foam blocks that were 3"x2" with the RUE and placing in a container on the table in front of her, at chest level.  She was able to complete task with min assist for shoulder flexion when reaching.  Progressed task to using 1 inch blocks with min assist as well.  Returned to the room at the end of the session via wheelchair with transfer back to the bed.  Pt's oldest son Lindsay Howard present.  Discussed her progress with him as well as the expected discharge date of 11/28.  Feel she could possibly go sooner if family education can be completed.  Talked with him about possible scheduling of education, but he was unable to give me a date that his aunt or his dad could come.  Will continue to follow-up.  Pt left with call button and phone in reach and safety alarm in place.    Therapy Documentation Precautions:  Precautions Precautions: Fall Precaution Comments: R hemiparesis, poor safety awareness Restrictions Weight Bearing Restrictions: No  Pain: Pain Assessment Pain Scale: 0-10 Faces Pain Scale: No hurt ADL: See Care Tool Section for some details of mobility and selfcare  Therapy/Group: Individual Therapy  Romell Cavanah OTR/L 10/28/2019, 3:56 PM

## 2019-10-28 NOTE — Progress Notes (Signed)
Physical Therapy Session Note  Patient Details  Name: Lindsay Howard MRN: [id number] Date of Birth: September 06, 1976  Today's Date: 10/28/2019 PT Individual Time: 2831-5176 PT Individual Time Calculation (min): 60 min   Short Term Goals: Week 1:  PT Short Term Goal 1 (Week 1): Pt will perform supine<>sit with supervision PT Short Term Goal 2 (Week 1): Pt will perform transfers with min assist PT Short Term Goal 3 (Week 1): Pt will ambulate at least 29f using LRAD with min assist PT Short Term Goal 4 (Week 1): Pt will ascend/descend 4 steps using handrails with min assist  Skilled Therapeutic Interventions/Progress Updates: Pt presented in w/c agreeable to therapy. Audio interpreter present throughout session. Pt denies pain during session. Pt ambulated to ortho gym no AD with CGA with some minA with fatigue. Pt participated in toe taps without AD with pt noting heavy R lean when wt shifting requiring minA for recovery.With increased cues to increase BOS pt was able to improve wt shifting without LOB. Pt also performed toe taps to target x 10 bilaterally with improved stability. Participated in horseshoe toss both on level tile and with LLE on 4 in step. Pt able to perform increased reaching outside BOS with RUE. Once completed participated in seated D1/D2 shoulder flexion/extension with 1Kg weighted ball with cues for tracking ball with each movement for R scanning. Pt was able to perform with min multimodal cues. Also performed lateral trunk rotations with 1Kg ball to engage trunk and scanning. Participated in ball toss on level surface with CGA and PTA providing manual facilitation at hips for increased wt bearing on R side. Also performed with LLE on 2in step for R bias with CGA and max encouragement to throw ball vs reaching out to second person. Participated in static balance on Airex with head turns and shoulder flexion with pt maintaining fair balance with min/mod challenges. Pt ambulated back to room with  CGA initially then minA with fatigue. Pt intially demonstrated increased BOS for approx 1245fthen with fatigue intermittent increased adduction however no LOB. Once back in room pt requesting to use toilet. Performed toilet transfers with CGA and able to manage LB clothing with steadying assist. Pt returned to w/c at end of session and left with belt alarm on, call bell within reach and needs met.      Therapy Documentation Precautions:  Precautions Precautions: Fall Precaution Comments: R hemiparesis, poor safety awareness Restrictions Weight Bearing Restrictions: No General:   Vital Signs: Therapy Vitals Pulse Rate: 67 Resp: 20 BP: 111/70 Patient Position (if appropriate): Lying Oxygen Therapy SpO2: 99 % O2 Device: Room Air Pain: Pain Assessment Pain Scale: 0-10 Faces Pain Scale: No hurt    Therapy/Group: Individual Therapy  Dacoda Finlay  Marjorie Deprey, PTA  10/28/2019, 3:55 PM

## 2019-10-28 NOTE — Progress Notes (Signed)
[facility] PHYSICAL MEDICINE & REHABILITATION PROGRESS NOTE   Subjective/Complaints: Working with SLP , pt states RUE feels heavy - we discussed weakness is due to stroek and reason for hospitalization  IPad translation service used audio mode   ROS: Limited due to language/cognition- with interpreter denies Breathing issues, no bowel or bladder issues  Objective:   No results found. Recent Labs    10/26/19 0526  WBC 5.7  HGB 12.7  HCT 40.0  PLT 265   Recent Labs    10/26/19 0526  NA 138  K 3.7  CL 102  CO2 28  GLUCOSE 102*  BUN 13  CREATININE 0.64  CALCIUM 9.4    Intake/Output Summary (Last 24 hours) at 10/28/2019 0846 Last data filed at 10/27/2019 1841 Gross per 24 hour  Intake 360 ml  Output -  Net 360 ml     Physical Exam: Vital Signs Blood pressure 118/69, pulse 65, temperature 98 F (36.7 C), resp. rate 16, height _0  (1.499 m), weight 61.9 kg, SpO2 99 %. Constitutional: No distress . Vital signs reviewed. HENT: Normocephalic.  Atraumatic. Eyes: EOMI. No discharge. Cardiovascular: No JVD. Respiratory: Normal effort.  No stridor. GI: Non-distended. Skin: Warm and dry.  Intact. Psych: Normal mood.  Normal behavior. Musc: No edema in extremities.  No tenderness in extremities. Neurologic: Alert Global aphasia Motor: Limited due to participation, however LUE/LLE appear to be 5/5 proximal distal RUE: 3+/5 proximal distal- unchanged  RLE:  4 --4/5 proximal distal  Assessment/Plan: 1. 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  Physiatrist is providing close team supervision and 24 hour management of active medical problems listed below.  Physiatrist and rehab team continue to assess barriers to discharge/monitor patient progress toward functional and medical goals  Care Tool:  Bathing    Body parts bathed by patient: Chest, Abdomen,  Front perineal area, Right upper leg, Left upper leg, Face, Buttocks, Left arm, Right lower leg, Left lower leg, Right arm   Body parts bathed by helper: Right arm     Bathing assist Assist Level: Minimal Assistance - Patient > 75%     Upper Body Dressing/Undressing Upper body dressing   What is the patient wearing?: Pull over shirt    Upper body assist Assist Level: Supervision/Verbal cueing    Lower Body Dressing/Undressing Lower body dressing      What is the patient wearing?: Underwear/pull up, Skirt     Lower body assist Assist for lower body dressing: Minimal Assistance - Patient > 75%     Toileting Toileting    Toileting assist Assist for toileting: Contact Guard/Touching assist     Transfers Chair/bed transfer  Transfers assist     Chair/bed transfer assist level: Minimal Assistance - Patient > 75%     Locomotion Ambulation   Ambulation assist      Assist level: Minimal Assistance - Patient > 75% Assistive device: No Device Max distance: ~446f   Walk 10 feet activity   Assist     Assist level: Minimal Assistance - Patient > 75% Assistive device: No Device   Walk 50 feet activity   Assist    Assist level: Minimal Assistance - Patient > 75% Assistive device: No Device    Walk 150 feet activity   Assist Walk 150 feet activity did not occur: Safety/medical concerns  Assist level: Minimal Assistance - Patient > 75% Assistive device: No Device  Walk 10 feet on uneven surface  activity   Assist Walk 10 feet on uneven surfaces activity did not occur: Safety/medical concerns         Wheelchair     Assist Will patient use wheelchair at discharge?: (TBD; however, anticipate pt will be functional ambulator)      Wheelchair assist level: Moderate Assistance - Patient 50 - 74%      Wheelchair 50 feet with 2 turns activity    Assist            Wheelchair 150 feet activity     Assist          Blood  pressure 118/69, pulse 65, temperature 98 F (36.7 C), resp. rate 16, height _0  (1.499 m), weight 61.9 kg, SpO2 99 %.  Medical Problem List and Plan: 1.  Impaired mobility and ADLs secondary to left thalamic hemorrhage             Impairments include expressive aphasia, R sided hemiplegia worse in the upper extremity, R LE ataxia, impaired coordination, impaired motor planning,   Continue CIR PT,OT, Team conference today please see physician documentation under team conference tab, met with team face-to-face to discuss problems,progress, and goals. Formulized individual treatment plan based on medical history, underlying problem and comorbidities.  2.  Antithrombotics: -DVT/anticoagulation:  Pharmaceutical: Lovenox             -antiplatelet therapy: N/A 3. Pain Management: N/A 4. Mood: LCSW to follow for evaluation and support as able              -antipsychotic agents: N/a 5. Neuropsych: This patient is not capable of making decisions on her own behalf. 6. Skin/Wound Care: Routine pressure relief measures.  7. Fluids/Electrolytes/Nutrition: Intake poor-- family to provide meals as able.  8. Hypertension: Patient will benefit from hypertension management education as she was not on any medication prior to this admission.   Continue Norvasc 30m QD. Goal SBP <140.   Vitals:   10/27/19 1934 10/28/19 0444  BP: 124/79 118/69  Pulse: 69 65  Resp: 16 16  Temp: 97.9 F (36.6 C) 98 F (36.7 C)  SpO2: 99% 99%   Controlled 11/17 9. Constipation: On Senna-Docusate BID.  MiraLAX  Improving 10.  Potassium-NORMAL 3.7   11.  Transaminitis  ALT, AST Stable   LOS: 7 days A FACE TO FACE EVALUATION WAS PERFORMED  ACharlett Blake11/18/2020, 8:46 AM

## 2019-10-28 NOTE — Patient Care Conference (Signed)
Inpatient RehabilitationTeam Conference and Plan of Care Update Date: 10/28/2019   Time: 10:30 AM    Patient Name: Lindsay Howard      Medical Record Number: [id number]  Date of Birth: [birth year] Sex: Female         Room/Bed: 4W19C/4W19C-01 Payor Info: Payor: [place] / Plan: [employer] OTHER / Product Type: *No Product type* /    Admit Date/Time:  10/21/2019  5:49 PM  Primary Diagnosis:  Thalamic hemorrhage [facility])  Patient Active Problem List   Diagnosis Date Noted  . Essential hypertension   . Transaminitis   . Slow transit constipation   . Accelerated hypertension   . Impaired mobility and activities of daily living   . Thalamic hemorrhage ([place]) 10/21/2019  . Intracerebral hemorrhage 10/15/2019  . ICH (intracerebral hemorrhage) ([place]) 10/15/2019  . Bulky or enlarged uterus 02/15/2016  . Acanthosis nigricans 02/15/2016  . Obesity 02/15/2016    Expected Discharge Date: Expected Discharge Date: 11/07/19  Team Members Present: Physician leading conference: Dr. Alysia Penna Social Worker Present: Ovidio Kin, LCSW Nurse Present: Other (comment);(Blair Montanti-LPN) Case Manager: Karene Fry, RN PT Present: Michaelene Song, PT OT Present: Clyda Greener, OT SLP Present: Charolett Bumpers, SLP PPS Coordinator present : Ileana Ladd, PT     Current Status/Progress Goal Weekly Team Focus  Bowel/Bladder   continent of bowel & bladder, LBM 10/27/19 in therapy as per patient report  remain continent  monitor & assist as needed   Swallow/Nutrition/ Hydration             ADL's   supervision for transfers to/ from toilet and toileting. Supervision to contact guard with basic ADLs. Can shower with contact guard. right UE [place] V  overall supervision  cognitive retraining, function mobiltiy with LRAD, self care retraining and family education   Mobility   supervision bed mobility, CGA/min assist tranfers, min assist gait up to 42ft without AD, mod assist 4 steps using  handrails - continues to demonstrate R inattention and poor safety awareness requiring increased assistance  supervision overall  R hemibody neuromuscular re-educatoin, trunk control, dynamic standing balance, gait training, R attention, pt education, activity tolerance   Communication   Sentence level 80-90% intelligbility Min A  Supervision A  speech intelligibility strategies for vocal intensity   Safety/Cognition/ Behavioral Observations  Max-Mod A  Mod A  Intellectual awareness, Basic problem solving, recall and orientation   Pain   c/o pain to her left leg, has tylenol prn  pain scale less than 4/10  assess & treat as needed   Skin   no areas of skin break down  no new areas of skin break down  assess q shift      *See Care Plan and progress notes for long and short-term goals.     Barriers to Discharge  Current Status/Progress Possible Resolutions Date Resolved   Nursing                  PT                    OT                  SLP                SW                Discharge Planning/Teaching Needs:  Home with family providing 24 hr care via husband, sister and niece. Pt wanting to  go home soon and husband given letter to miss work to visit her here      Team Discussion: L thalamic hemorrhage, BP good control, monitoring K+, mild liver function elevation, poor awareness of deficits.  RN - used translator, tingling/numbness legs, continent, no pain.  OT CGA toilet transfers, min A toileting, goals S level.  PT min/mod gait no device 80', poor R attention, needs 24/7 S.  SLP decreased awareness, working on orientation, mod/max basic cognition/problem solving, goals mod I.  Need husband to come in.  Niece/sister to help, son 34 yo is in school.   Revisions to Treatment Plan: N/A     Medical Summary Current Status: Patient wants to go home.  Poor awareness of deficits.  Right side feels heavy but no change in motor exam Weekly Focus/Goal: Discharge planning  Barriers to  Discharge: Medication compliance;Other (comments)  Barriers to Discharge Comments: Poor awareness of deficits Possible Resolutions to Barriers: Patient and family education   Continued Need for Acute Rehabilitation Level of Care: The patient requires daily medical management by a physician with specialized training in physical medicine and rehabilitation for the following reasons: Direction of a multidisciplinary physical rehabilitation program to maximize functional independence : Yes Medical management of patient stability for increased activity during participation in an intensive rehabilitation regime.: Yes Analysis of laboratory values and/or radiology reports with any subsequent need for medication adjustment and/or medical intervention. : Yes   I attest that I was present, lead the team conference, and concur with the assessment and plan of the team.   Trish Mage 10/28/2019, 7:56 PM  Team conference was held via web/ teleconference due to COVID - 19

## 2019-10-28 NOTE — Progress Notes (Signed)
[place] PHYSICAL MEDICINE & REHABILITATION PROGRESS NOTE   Subjective/Complaints: Working with SLP, interpreter on iPad  ROS: Limited due to language/cognition- with interpreter denies Breathing issues, no bowel or bladder issues  Objective:   No results found. Recent Labs    10/26/19 0526  WBC 5.7  HGB 12.7  HCT 40.0  PLT 265   Recent Labs    10/26/19 0526  NA 138  K 3.7  CL 102  CO2 28  GLUCOSE 102*  BUN 13  CREATININE 0.64  CALCIUM 9.4    Intake/Output Summary (Last 24 hours) at 10/28/2019 0954 Last data filed at 10/27/2019 1841 Gross per 24 hour  Intake 360 ml  Output -  Net 360 ml     Physical Exam: Vital Signs Blood pressure 118/69, pulse 65, temperature 98 F (36.7 C), resp. rate 16, height 4' 11"  (1.499 m), weight 61.9 kg, SpO2 99 %. Constitutional: No distress . Vital signs reviewed. HENT: Normocephalic.  Atraumatic. Eyes: EOMI. No discharge. Cardiovascular: No JVD. Respiratory: Normal effort.  No stridor. GI: Non-distended. Skin: Warm and dry.  Intact. Psych: Normal mood.  Normal behavior. Musc: No edema in extremities.  No tenderness in extremities. Neurologic: Alert Global aphasia Motor: Limited due to participation, however LUE/LLE appear to be 5/5 proximal distal RUE: 3+/5 proximal distal- unchanged  RLE:  4 --4/5 proximal distal  Assessment/Plan: 1. 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  Physiatrist is providing close team supervision and 24 hour management of active medical problems listed below.  Physiatrist and rehab team continue to assess barriers to discharge/monitor patient progress toward functional and medical goals  Care Tool:  Bathing    Body parts bathed by patient: Chest, Abdomen, Front perineal area, Right upper leg, Left upper leg, Face, Buttocks, Left arm, Right lower leg, Left lower leg, Right arm    Body parts bathed by helper: Right arm     Bathing assist Assist Level: Minimal Assistance - Patient > 75%     Upper Body Dressing/Undressing Upper body dressing   What is the patient wearing?: Pull over shirt    Upper body assist Assist Level: Supervision/Verbal cueing    Lower Body Dressing/Undressing Lower body dressing      What is the patient wearing?: Underwear/pull up, Skirt     Lower body assist Assist for lower body dressing: Minimal Assistance - Patient > 75%     Toileting Toileting    Toileting assist Assist for toileting: Contact Guard/Touching assist     Transfers Chair/bed transfer  Transfers assist     Chair/bed transfer assist level: Minimal Assistance - Patient > 75%     Locomotion Ambulation   Ambulation assist      Assist level: Minimal Assistance - Patient > 75% Assistive device: No Device Max distance: ~427f   Walk 10 feet activity   Assist     Assist level: Minimal Assistance - Patient > 75% Assistive device: No Device   Walk 50 feet activity   Assist    Assist level: Minimal Assistance - Patient > 75% Assistive device: No Device    Walk 150 feet activity   Assist Walk 150 feet activity did not occur: Safety/medical concerns  Assist level: Minimal Assistance - Patient > 75% Assistive device: No Device    Walk 10 feet on uneven surface  activity   Assist Walk 10 feet on uneven surfaces activity did not occur:  Safety/medical concerns         Wheelchair     Assist Will patient use wheelchair at discharge?: (TBD; however, anticipate pt will be functional ambulator)      Wheelchair assist level: Moderate Assistance - Patient 50 - 74%      Wheelchair 50 feet with 2 turns activity    Assist            Wheelchair 150 feet activity     Assist          Blood pressure 118/69, pulse 65, temperature 98 F (36.7 C), resp. rate 16, height 4' 11"  (1.499 m), weight 61.9 kg, SpO2 99  %.  Medical Problem List and Plan: 1.  Impaired mobility and ADLs secondary to left thalamic hemorrhage             Impairments include expressive aphasia, R sided hemiplegia worse in the upper extremity, R LE ataxia, impaired coordination, impaired motor planning,   Continue CIR PT,OT, Team conference today please see physician documentation under team conference tab, met with team face-to-face to discuss problems,progress, and goals. Formulized individual treatment plan based on medical history, underlying problem and comorbidities.  2.  Antithrombotics: -DVT/anticoagulation:  Pharmaceutical: Lovenox             -antiplatelet therapy: N/A 3. Pain Management: N/A 4. Mood: LCSW to follow for evaluation and support as able              -antipsychotic agents: N/a 5. Neuropsych: This patient is not capable of making decisions on her own behalf. 6. Skin/Wound Care: Routine pressure relief measures.  7. Fluids/Electrolytes/Nutrition: Intake poor-- family to provide meals as able.  8. Hypertension: Patient will benefit from hypertension management education as she was not on any medication prior to this admission.   Continue Norvasc 88m QD. Goal SBP <140.   Vitals:   10/27/19 1934 10/28/19 0444  BP: 124/79 118/69  Pulse: 69 65  Resp: 16 16  Temp: 97.9 F (36.6 C) 98 F (36.7 C)  SpO2: 99% 99%   Controlled 11/17 9. Constipation: On Senna-Docusate BID.  MiraLAX  Improving 10.  Potassium-NORMAL 3.7   11.  Transaminitis  ALT, AST Stable   LOS: 7 days A FACE TO FACE EVALUATION WAS PERFORMED  ACharlett Blake11/18/2020, 9:54 AM

## 2019-10-28 NOTE — Plan of Care (Signed)
  Problem: Consults Goal: RH STROKE PATIENT EDUCATION Description: See Patient Education module for education specifics  Outcome: Progressing   Problem: RH BOWEL ELIMINATION Goal: RH STG MANAGE BOWEL WITH ASSISTANCE Description: STG Manage Bowel with min Assistance. Outcome: Progressing Goal: RH STG MANAGE BOWEL W/MEDICATION W/ASSISTANCE Description: STG Manage Bowel with Medication with min Assistance. Outcome: Progressing   Problem: RH BLADDER ELIMINATION Goal: RH STG MANAGE BLADDER WITH ASSISTANCE Description: STG Manage Bladder With min Assistance Outcome: Progressing   Problem: RH SAFETY Goal: RH STG ADHERE TO SAFETY PRECAUTIONS W/ASSISTANCE/DEVICE Description: STG Adhere to Safety Precautions With supervision/min Assistance/Device. Outcome: Progressing   Problem: RH KNOWLEDGE DEFICIT Goal: RH STG INCREASE KNOWLEDGE OF HYPERTENSION Description: Patient/caregiver will verbalize understanding of HTN including monitoring, medications, diet changes, exercise, and follow up care with min/mod assist. Outcome: Progressing Goal: RH STG INCREASE KNOWLEGDE OF HYPERLIPIDEMIA Description: Patient/caregiver will verbalize understanding of HLD including monitoring, medications, diet changes, exercise, and follow up care with min/mod assist. Outcome: Progressing Goal: RH STG INCREASE KNOWLEDGE OF STROKE PROPHYLAXIS Description: Patient/caregiver will verbalize understanding of stroke prophylaxis including monitoring, medications, diet changes, exercise, and follow up care with min/mod assist. Outcome: Progressing   

## 2019-10-29 ENCOUNTER — Inpatient Hospital Stay (HOSPITAL_COMMUNITY): Payer: BLUE CROSS/BLUE SHIELD | Admitting: Speech Pathology

## 2019-10-29 ENCOUNTER — Inpatient Hospital Stay (HOSPITAL_COMMUNITY): Payer: BLUE CROSS/BLUE SHIELD | Admitting: Physical Therapy

## 2019-10-29 ENCOUNTER — Inpatient Hospital Stay (HOSPITAL_COMMUNITY): Payer: BLUE CROSS/BLUE SHIELD | Admitting: Occupational Therapy

## 2019-10-29 DIAGNOSIS — I610 Nontraumatic intracerebral hemorrhage in hemisphere, subcortical: Secondary | ICD-10-CM | POA: Diagnosis not present

## 2019-10-29 NOTE — Progress Notes (Signed)
Physical Therapy Weekly Progress Note  Patient Details  Name: Lindsay Howard MRN: [id number] Date of Birth: 1976/04/16  Beginning of progress report period: October 22, 2019 End of progress report period: October 29, 2019  Today's Date: 10/29/2019 PT Individual Time: 2979-8921 PT Individual Time Calculation (min): 75 min   Patient has met 4 of 4 short term goals. Patient progressing well with therapy demonstrating improvements in R attention, R hemibody muscle activation and motor control, standing balance, and functional mobility. Patient is performing bed mobility with supervision, transfers with CGA/min assist, gait without an AD with CGA/min assist, and ascending/descending 12 steps using handrails with min assist. Patient continues to demonstrate significant lack of awareness of how her deficits affect her functional mobility, impaired R attention, as well as delayed and inadequate balance strategies.  Patient continues to demonstrate the following deficits muscle weakness, decreased cardiorespiratoy endurance, impaired timing and sequencing, abnormal tone, unbalanced muscle activation and decreased coordination, decreased attention to right, decreased attention, decreased awareness, decreased problem solving, decreased safety awareness and decreased memory and decreased sitting balance, decreased standing balance, decreased postural control and decreased balance strategies and therefore will continue to benefit from skilled PT intervention to increase functional independence with mobility.  Patient progressing toward long term goals..  Continue plan of care.  PT Short Term Goals Week 1:  PT Short Term Goal 1 (Week 1): Pt will perform supine<>sit with supervision PT Short Term Goal 1 - Progress (Week 1): Met PT Short Term Goal 2 (Week 1): Pt will perform transfers with min assist PT Short Term Goal 2 - Progress (Week 1): Met PT Short Term Goal 3 (Week 1): Pt will ambulate at least 94f using  LRAD with min assist PT Short Term Goal 3 - Progress (Week 1): Met PT Short Term Goal 4 (Week 1): Pt will ascend/descend 4 steps using handrails with min assist PT Short Term Goal 4 - Progress (Week 1): Met Week 2:  PT Short Term Goal 1 (Week 2): = to LTGs based on ELOS  Skilled Therapeutic Interventions/Progress Updates:  Ambulation/gait training;Community reintegration;DME/adaptive equipment instruction;Neuromuscular re-education;Psychosocial support;Stair training;UE/LE Strength taining/ROM;Wheelchair propulsion/positioning;Balance/vestibular training;Discharge planning;Functional electrical stimulation;Pain management;Skin care/wound management;Therapeutic Activities;UE/LE Coordination activities;Cognitive remediation/compensation;Disease management/prevention;Functional mobility training;Patient/family education;Splinting/orthotics;Therapeutic Exercise;Visual/perceptual remediation/compensation  Pt received sitting EOB with nursing staff present and pt agreeable to therapy session. Audio interpreter [phone] used throughout session. Ambulated ~15fto bathroom, no AD, with CGA for steadying. Sit<>stand on/off toilet with CGA for steadying - standing with CGA performed LB clothing management with cuing for increased use of R UE - continent of bladder. Pt opening room doors with hand-over-hand assist for use of R hand and cuing for R attention to not hit her R foot with the door. Ambulated ~[id number] main therapy gym, no AD, with CGA and intermittent min assist for balance - pt demonstrating improved R attention with forward gaze as opposed to constant L gaze. Ascended/descended 12 steps using R UE support with CGA/min assist for balance and pt using step-to pattern with intermittent reciprocal pattern.  Performed the following dynamic standing balance tasks:  - alternate B LE foot taps on 4" step with pt having minor R lateral LOB due to not abducting R foot enough when bringing foot down - cuing for  improvement - R lateral step up/down on 4" step, no UE support, with min assist for balance and max cuing for larger lateral steps to leave room for her feet - 4 square stepping over sticks with min assist for balance due  to R lateral LOB especially when performing R lateral stepping and backwards stepping Education and cuing to recover R lateral LOB prior to continuing the task to increase pt safety awareness. MD in/out for morning assessment. Pt performed alternate B LE foot lifts with R lateral LOB with pt demonstrating delayed and inadequate balance strategy.  Donned maxisky harness while standing with CGA for steadying. Performed dynamic standing balance tasks while in maxisky harness of walking forwards, sidestepping, and alternate B LE foot taps on cone. During the cone taps pt demonstrating significant R lateral LOB requiring harness support to maintain upright - therapist providing max cuing for improved R LE stepping to regain balance. Ambulated ~115f back to room, no AD, with CGA for steadying. Sit>supine on bed with supervision and pt left supine in bed with needs in reach and bed alarm on.   Therapy Documentation Precautions:  Precautions Precautions: Fall Precaution Comments: R hemiparesis, poor safety awareness Restrictions Weight Bearing Restrictions: No  Pain: Continues to report the numbness in both of her legs but no pain - MD and PA aware.  Therapy/Group: Individual Therapy  CTawana Scale PT, DPT 10/29/2019, 7:48 AM

## 2019-10-29 NOTE — Progress Notes (Signed)
Speech Language Pathology Weekly Progress and Session Note  Patient Details  Name: Miliana Gangwer MRN: [id number] Date of Birth: 1976/12/04  Beginning of progress report period: October 22, 2019 End of progress report period: October 29, 2019  Today's Date: 10/29/2019 SLP Individual Time: 1300-1355 SLP Individual Time Calculation (min): 55 min  Short Term Goals: Week 1: SLP Short Term Goal 1 (Week 1): Patient will demonstrate orientation to place, time and situation with Max A multimodal cues. SLP Short Term Goal 1 - Progress (Week 1): Met SLP Short Term Goal 2 (Week 1): Patient will demonstrate functional problem solving for basic and familiar tasks with Max A multimodal cues. SLP Short Term Goal 2 - Progress (Week 1): Met SLP Short Term Goal 3 (Week 1): Patient will identify 2 physical and 2 cognitive deficits with Max A multimodal cues. SLP Short Term Goal 3 - Progress (Week 1): Met SLP Short Term Goal 4 (Week 1): Patient will recall new, daily information with Max A multimodal cues. SLP Short Term Goal 5 (Week 1): Patient will maximize speech intelligibility to ~90% at the sentence level with use of an increased vocal intensity with Min A verbal cues SLP Short Term Goal 5 - Progress (Week 1): Met    New Short Term Goals: Week 2: SLP Short Term Goal 1 (Week 2): STGs=LTGs due ELOS  Weekly Progress Updates: Patient has made functional gains and has met 5 of 5 LTGs this reporting period. Currently, patient requires overall Max A multimodal cues to complete functional and familiar tasks safely in regards to orientation, basic problem solving, intellectual awareness of deficits and recall of daily information. Patient is also 100% intelligible at the phrase and sentence level with overall Mod I. Patient and family education is ongoing. Patient would benefit from continued skilled SLP intervention to maximize her cognitive functioning and overall functional independence prior to discharge.       Intensity: Minumum of 1-2 x/day, 30 to 90 minutes Frequency: 3 to 5 out of 7 days Duration/Length of Stay: 11/07/19 Treatment/Interventions: Cognitive remediation/compensation;Internal/external aids;Therapeutic Activities;Environmental controls;Cueing hierarchy;Patient/family education;Functional tasks   Daily Session  Skilled Therapeutic Interventions: Skilled treatment session focused on cognitive goals. SLP facilitated session by providing Max A multimodal cues for patient to identify potential safety hazards with picture cards. Patient completed a sorting task from a field of 5 with Min verbal cues but demonstrated appropriate attention to tasks. Patient requested to use the bathroom and completed task with overall Min A verbal cues for safety. Patient left upright in bed with alarm on and all needs within reach. Continue with current plan of care.      Pain No/Denies Pain   Therapy/Group: Individual Therapy  Caren Garske 10/29/2019, 6:37 AM

## 2019-10-29 NOTE — Progress Notes (Signed)
[place] PHYSICAL MEDICINE & REHABILITATION PROGRESS NOTE   Subjective/Complaints:  Seen in physical therapy.  Discussed discharge date as well as potential for going home earlier if her mobility and safety improve.  Need to get family in for training.  ROS: Limited due to language/cognition- with interpreter denies Breathing issues, no bowel or bladder issues  Objective:   No results found. No results for input(s): WBC, HGB, HCT, PLT in the last 72 hours. No results for input(s): NA, K, CL, CO2, GLUCOSE, BUN, CREATININE, CALCIUM in the last 72 hours.  Intake/Output Summary (Last 24 hours) at 10/29/2019 0819 Last data filed at 10/29/2019 0756 Gross per 24 hour  Intake 580 ml  Output 1 ml  Net 579 ml     Physical Exam: Vital Signs Blood pressure 112/76, pulse 67, temperature 97.9 F (36.6 C), resp. rate 18, height 4\' 11"  (1.499 m), weight 61.9 kg, SpO2 100 %. Constitutional: No distress . Vital signs reviewed. HENT: Normocephalic.  Atraumatic. Eyes: EOMI. No discharge. Cardiovascular: No JVD. Respiratory: Normal effort.  No stridor. GI: Non-distended. Skin: Warm and dry.  Intact. Psych: Normal mood.  Normal behavior. Musc: No edema in extremities.  No tenderness in extremities. Neurologic: Alert Global aphasia Motor: Limited due to participation, however LUE/LLE appear to be 5/5 proximal distal RUE: 3+/5 proximal distal- unchanged  RLE:  4 --4/5 proximal distal  Assessment/Plan: 1. 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  Physiatrist is providing close team supervision and 24 hour management of active medical problems listed below.  Physiatrist and rehab team continue to assess barriers to discharge/monitor patient progress toward functional and medical goals  Care Tool:  Bathing    Body parts bathed by patient: Chest, Abdomen, Front perineal area,  Right upper leg, Left upper leg, Face, Buttocks, Left arm, Right lower leg, Left lower leg, Right arm   Body parts bathed by helper: Right arm     Bathing assist Assist Level: Minimal Assistance - Patient > 75%     Upper Body Dressing/Undressing Upper body dressing   What is the patient wearing?: Pull over shirt    Upper body assist Assist Level: Supervision/Verbal cueing    Lower Body Dressing/Undressing Lower body dressing      What is the patient wearing?: Underwear/pull up, Pants     Lower body assist Assist for lower body dressing: Minimal Assistance - Patient > 75%     Toileting Toileting    Toileting assist Assist for toileting: Minimal Assistance - Patient > 75%     Transfers Chair/bed transfer  Transfers assist     Chair/bed transfer assist level: Minimal Assistance - Patient > 75%     Locomotion Ambulation   Ambulation assist      Assist level: Minimal Assistance - Patient > 75% Assistive device: No Device Max distance: 242ft   Walk 10 feet activity   Assist     Assist level: Minimal Assistance - Patient > 75% Assistive device: No Device   Walk 50 feet activity   Assist    Assist level: Minimal Assistance - Patient > 75% Assistive device: No Device    Walk 150 feet activity   Assist Walk 150 feet activity did not occur: Safety/medical concerns  Assist level: Minimal Assistance - Patient > 75% Assistive device: No Device    Walk 10 feet on uneven surface  activity   Assist Walk 10 feet on uneven  surfaces activity did not occur: Safety/medical concerns         Wheelchair     Assist Will patient use wheelchair at discharge?: (TBD; however, anticipate pt will be functional ambulator)      Wheelchair assist level: Moderate Assistance - Patient 50 - 74%      Wheelchair 50 feet with 2 turns activity    Assist            Wheelchair 150 feet activity     Assist          Blood pressure 112/76,  pulse 67, temperature 97.9 F (36.6 C), resp. rate 18, height 4\' 11"  (1.499 m), weight 61.9 kg, SpO2 100 %.  Medical Problem List and Plan: 1.  Impaired mobility and ADLs secondary to left thalamic hemorrhage             Impairments include expressive aphasia, R sided hemiplegia worse in the upper extremity, R LE ataxia, impaired coordination, impaired motor planning,   Continue CIR PT,OT, therapy feels patient needs to be here until 11/28.  Patient should be able to discharge sooner if family training can be initiated.  The patient is medically stable 2.  Antithrombotics: -DVT/anticoagulation:  Pharmaceutical: Lovenox             -antiplatelet therapy: N/A 3. Pain Management: N/A 4. Mood: LCSW to follow for evaluation and support as able              -antipsychotic agents: N/a 5. Neuropsych: This patient is not capable of making decisions on her own behalf. 6. Skin/Wound Care: Routine pressure relief measures.  7. Fluids/Electrolytes/Nutrition: Intake poor-- family to provide meals as able.  8. Hypertension: Patient will benefit from hypertension management education as she was not on any medication prior to this admission.   Continue Norvasc 5mg  QD. Goal SBP <140.   Vitals:   10/28/19 1934 10/29/19 0507  BP: 111/75 112/76  Pulse: 65 67  Resp: 18 18  Temp: 97.8 F (36.6 C) 97.9 F (36.6 C)  SpO2: 100% 100%   Controlled 11/18 9. Constipation: On Senna-Docusate BID.  MiraLAX  Improving    11.  Transaminitis  ALT, AST Stable   LOS: 8 days A FACE TO FACE EVALUATION WAS PERFORMED  10/31/19 10/29/2019, 8:19 AM

## 2019-10-29 NOTE — Plan of Care (Signed)
  Problem: Consults Goal: RH STROKE PATIENT EDUCATION Description: See Patient Education module for education specifics  Outcome: Progressing   Problem: RH BOWEL ELIMINATION Goal: RH STG MANAGE BOWEL WITH ASSISTANCE Description: STG Manage Bowel with min Assistance. Outcome: Progressing Goal: RH STG MANAGE BOWEL W/MEDICATION W/ASSISTANCE Description: STG Manage Bowel with Medication with min Assistance. Outcome: Progressing   Problem: RH BLADDER ELIMINATION Goal: RH STG MANAGE BLADDER WITH ASSISTANCE Description: STG Manage Bladder With min Assistance Outcome: Progressing   Problem: RH SAFETY Goal: RH STG ADHERE TO SAFETY PRECAUTIONS W/ASSISTANCE/DEVICE Description: STG Adhere to Safety Precautions With supervision/min Assistance/Device. Outcome: Progressing   Problem: RH KNOWLEDGE DEFICIT Goal: RH STG INCREASE KNOWLEDGE OF HYPERTENSION Description: Patient/caregiver will verbalize understanding of HTN including monitoring, medications, diet changes, exercise, and follow up care with min/mod assist. Outcome: Progressing Goal: RH STG INCREASE KNOWLEGDE OF HYPERLIPIDEMIA Description: Patient/caregiver will verbalize understanding of HLD including monitoring, medications, diet changes, exercise, and follow up care with min/mod assist. Outcome: Progressing Goal: RH STG INCREASE KNOWLEDGE OF STROKE PROPHYLAXIS Description: Patient/caregiver will verbalize understanding of stroke prophylaxis including monitoring, medications, diet changes, exercise, and follow up care with min/mod assist. Outcome: Progressing   

## 2019-10-29 NOTE — Progress Notes (Signed)
Occupational Therapy Session Note  Patient Details  Name: Lindsay Howard MRN: [id number] Date of Birth: Sep 03, 1976  Today's Date: 10/29/2019 OT Individual Time: 1001-1058 OT Individual Time Calculation (min): 57 min    Short Term Goals: Week 2:  OT Short Term Goal 1 (Week 2): Pt will continue working toward established LTGs set at supervision level overall.  Skilled Therapeutic Interventions/Progress Updates:    Pt completed transfer to the toilet with min guard assist and no device.  She then transferred out to the sink at the same level where she washed her hands with min guard and transferred to the wheelchair.  Therapist took her down to the ortho gym where she complete 4 intervals of 2 mins using the UE ergonometer with resistance on level 1.  She was able to complete 1 set with both UEs and then 3 sets with just use of the RUE only.  Therapist had to tape the right hand on the grip secondary to decreased sustained grip.  She needed min assist to avoid shoulder compensation with revolutions of less than 10 RPMs per minute.  Next, had pt stand and use the RUE for functional reach with the [employer].  Mod demonstrational cueing and min support to complete functional reach for 4 sets of 1 min.  She average 4.2-3.5 seconds with all intervals and was only able to press out 7/15 when given a 4 second time limit.  Finished session with practice stepping over the edge of the tub with min guard assist.  Returned to the room via wheelchair with pt transferring back to the bed.  Pt left in the bed with call button and phone in reach and bed alarm in place.    Therapy Documentation Precautions:  Precautions Precautions: Fall Precaution Comments: R hemiparesis, poor safety awareness Restrictions Weight Bearing Restrictions: No   Pain: Pain Assessment Pain Scale: Faces Pain Score: 0-No pain ADL: See Care Tool Section for some details of mobility and selfcare  Therapy/Group: Individual  Therapy  Geno Sydnor OTR/L 10/29/2019, 12:42 PM

## 2019-10-30 ENCOUNTER — Inpatient Hospital Stay (HOSPITAL_COMMUNITY): Payer: BLUE CROSS/BLUE SHIELD | Admitting: Speech Pathology

## 2019-10-30 ENCOUNTER — Inpatient Hospital Stay (HOSPITAL_COMMUNITY): Payer: BLUE CROSS/BLUE SHIELD | Admitting: Occupational Therapy

## 2019-10-30 ENCOUNTER — Inpatient Hospital Stay (HOSPITAL_COMMUNITY): Payer: BLUE CROSS/BLUE SHIELD | Admitting: Physical Therapy

## 2019-10-30 DIAGNOSIS — I610 Nontraumatic intracerebral hemorrhage in hemisphere, subcortical: Secondary | ICD-10-CM | POA: Diagnosis not present

## 2019-10-30 MED ORDER — ACETAMINOPHEN 325 MG PO TABS: 325.0000 mg | ORAL_TABLET | ORAL | Status: DC | PRN

## 2019-10-30 NOTE — Progress Notes (Signed)
Physical Therapy Session Note  Patient Details  Name: Lindsay Howard MRN: [id number] Date of Birth: 1976/03/15  Today's Date: 10/30/2019 PT Individual Time: 1055-1203 PT Individual Time Calculation (min): 68 min   Short Term Goals: Week 2:  PT Short Term Goal 1 (Week 2): = to LTGs based on ELOS  Skilled Therapeutic Interventions/Progress Updates:    Pt received sitting in w/c and agreeable to therapy session. Audio interpreter [phone]) present throughout session. Ambulated ~71ft to bathroom, no AD, with CGA for steadying. Sit<>stand on/off toilet with CGA for steadying - pt continent of urine and noted to be on menstrual cycle, provided patient with hygiene pads which she placed with CGA for steadying balance while standing. Therapist continued to cue throughout session for increased use of R UE to manage clothes and open/close doors. Ambulated ~5ft to sink, no AD, with CGA for steadying while performing hand hygiene. Ambulated ~137ft to main therapy gym, no AD, with CGA for steadying. Performed seated R UE NMR task of placing wooden pegs in board with cuing for all finger extension prior to grasping peg as pt often only uses index and middle finger with thumb. Therapist fitted pt for youth RW. Gait training ~8ft x2 using RW with pt education on slowing down and improving AD management as pt often having increased postural sway and path deviation laterally with poor AD awareness. Gait training using RW ~34ft of weaving through cones for improved AD awareness and management with cuing for turning AD and slowing down for increased control. Dynamic standing balance and R UE NMR task of balancing on airex while completing moderate complexity PEG board pattern - close supervision for standing balance and mod cuing for proper completion of pattern due to R inattention and proper PEG location. Dynamic standing balance with trunk/pelvis disassociation and R UE NMR task of standing 1kg ball raises overhead and then  D1/D2 chopping patterns with CGA for steadying and manual facilitation for improved trunk rotation and increased R UE movement. Gait training ~126ft using RW back to room with CGA for steadying and again cuing for increased AD awareness for improved safety. Sit>supine in bed with supervision. Pt left supine in bed with needs in reach and bed alarm on.  Therapy Documentation Precautions:  Precautions Precautions: Fall Precaution Comments: R hemiparesis, poor safety awareness Restrictions Weight Bearing Restrictions: No  Pain:   Denies pain during session.    Therapy/Group: Individual Therapy  Tawana Scale, PT, DPT  10/30/2019, 7:58 AM

## 2019-10-30 NOTE — Progress Notes (Signed)
Occupational Therapy Session Note  Patient Details  Name: Lindsay Howard MRN: [id number] Date of Birth: [birth year]  Today's Date: 10/30/2019 OT Individual Time: 0800-0902 OT Individual Time Calculation (min): 62 min    Short Term Goals: Week 2:  OT Short Term Goal 1 (Week 2): Pt will continue working toward established LTGs set at supervision level overall.  Skilled Therapeutic Interventions/Progress Updates:    Pt completed bathing and dressing during session after completion of self feeding.  She was able to to ambulate to the shower seat and then complete doffing dirty clothing with setup and min demonstrational cueing.  She then completed all bathing sit to stand with mod demonstrational cueing secondary to language barrier and supervision.  She then transferred out to the EOB with min guard assist for balance and completed LB selfcare sit to stand with supervision.  She ambulated to the sink for grooming tasks of brushing her hair and her teeth.  She was able to brush her hair with mod assist using the RUE primarily.  Supervision for brushing her teeth with use of the LUE.  She spontaneously uses the RUE during bathing and dressing tasks with overall occasional min assist.  She next, ambulated to the dayroom where she worked on [employer] FM coordination in standing and sitting.  She was able to use the RUE to pick up and place checkers one at a time in the [employer] and min assist.  Min demonstrational cueing to maintain right knee extension while standing.  She did exhibit 25% drops during completion of task secondary to decreased FM coordination.  Finished session with ambulation back to the room at min guard assist and transfer to the wheelchair at bedside.  Pt was left in with call button and phone in reach and safety belt in place.    Therapy Documentation Precautions:  Precautions Precautions: Fall Precaution Comments: R hemiparesis, poor safety awareness Restrictions Weight Bearing  Restrictions: No   Pain: Pain Assessment Pain Scale: Faces Faces Pain Scale: No hurt ADL: See Care Tool Section for some details of mobility and selfcare  Therapy/Group: Individual Therapy  Orphia Mctigue OTR/L 10/30/2019, 10:35 AM

## 2019-10-30 NOTE — Progress Notes (Addendum)
Social Work Patient ID: Lindsay Howard, female   DOB: Dec 07, 1976, 43 y.o.   MRN: [id number]  Spoke with niece-Klaw who speaks English to se if can have a family member come in Sunday at 10;00 for PT session so can move up discharge date. Pt is really wanting to go home and be with her family. Pt's sister no will be there with her at discharge. Niece to call this worker back to let me know if they can. Work on discharge needs. Spoke with financial counselor-Ebony Tamala Julian who is waiting for cousin to call back she could not get the information she needed due to language and cognitive issues from pt. Klaw called back and she will come along with husband to go to therapies Sun at 10:00. Called to [employer] to approve niece and husband to come up stairs for training.

## 2019-10-30 NOTE — Plan of Care (Signed)
  Problem: Consults Goal: RH STROKE PATIENT EDUCATION Description: See Patient Education module for education specifics  Outcome: Progressing   Problem: RH BOWEL ELIMINATION Goal: RH STG MANAGE BOWEL WITH ASSISTANCE Description: STG Manage Bowel with min Assistance. Outcome: Progressing Goal: RH STG MANAGE BOWEL W/MEDICATION W/ASSISTANCE Description: STG Manage Bowel with Medication with min Assistance. Outcome: Progressing   Problem: RH BLADDER ELIMINATION Goal: RH STG MANAGE BLADDER WITH ASSISTANCE Description: STG Manage Bladder With min Assistance Outcome: Progressing   Problem: RH SAFETY Goal: RH STG ADHERE TO SAFETY PRECAUTIONS W/ASSISTANCE/DEVICE Description: STG Adhere to Safety Precautions With supervision/min Assistance/Device. Outcome: Progressing   Problem: RH KNOWLEDGE DEFICIT Goal: RH STG INCREASE KNOWLEDGE OF HYPERTENSION Description: Patient/caregiver will verbalize understanding of HTN including monitoring, medications, diet changes, exercise, and follow up care with min/mod assist. Outcome: Progressing Goal: RH STG INCREASE KNOWLEGDE OF HYPERLIPIDEMIA Description: Patient/caregiver will verbalize understanding of HLD including monitoring, medications, diet changes, exercise, and follow up care with min/mod assist. Outcome: Progressing Goal: RH STG INCREASE KNOWLEDGE OF STROKE PROPHYLAXIS Description: Patient/caregiver will verbalize understanding of stroke prophylaxis including monitoring, medications, diet changes, exercise, and follow up care with min/mod assist. Outcome: Progressing   

## 2019-10-30 NOTE — Progress Notes (Signed)
Speech Language Pathology Daily Session Note  Patient Details  Name: Lindsay Howard MRN: [id number] Date of Birth: [birth year]  Today's Date: 10/30/2019 SLP Individual Time: 1215-1310 SLP Individual Time Calculation (min): 55 min  Short Term Goals: Week 2: SLP Short Term Goal 1 (Week 2): STGs=LTGs due ELOS  Skilled Therapeutic Interventions: Skilled treatment session focused on cognitive goals. SLP facilitated session by providing skilled observation with lunch meal of regular textures with thin liquids. Patient required intermittent assist with tray set-up but was able to locate items within right visual field independently. SLP also facilitated session session by providing Min A verbal and tactile cues for patient to utilize RUE during a basic visual scanning task in which she completed with overall supervision level cues. Patient left supine in bed with alarm on and all needs within reach. Continue with current plan of care.      Pain No/Denies Pain   Therapy/Group: Individual Therapy  Julien Berryman 10/30/2019, 1:14 PM

## 2019-10-30 NOTE — Progress Notes (Signed)
[facility] PHYSICAL MEDICINE & REHABILITATION PROGRESS NOTE   Subjective/Complaints:  Supervision with ADL and mobility  Family training not completed Discussed with SW  and OT   ROS: Limited due to language/cognition- with interpreter denies Breathing issues, no bowel or bladder issues  Objective:   No results found. No results for input(s): WBC, HGB, HCT, PLT in the last 72 hours. No results for input(s): NA, K, CL, CO2, GLUCOSE, BUN, CREATININE, CALCIUM in the last 72 hours.  Intake/Output Summary (Last 24 hours) at 10/30/2019 0859 Last data filed at 10/29/2019 1742 Gross per 24 hour  Intake 580 ml  Output -  Net 580 ml     Physical Exam: Vital Signs Blood pressure 116/77, pulse 70, temperature 97.8 F (36.6 C), resp. rate 18, height 4\' 11"  (1.499 m), weight 61.9 kg, SpO2 100 %. Constitutional: No distress . Vital signs reviewed. HENT: Normocephalic.  Atraumatic. Eyes: EOMI. No discharge. Cardiovascular: No JVD. Respiratory: Normal effort.  No stridor. GI: Non-distended. Skin: Warm and dry.  Intact. Psych: Normal mood.  Normal behavior. Musc: No edema in extremities.  No tenderness in extremities. Neurologic: Alert Global aphasia Motor: Limited due to participation, however LUE/LLE appear to be 5/5 proximal distal RUE: 3+/5 proximal distal- unchanged  RLE:  4 --4/5 proximal distal  Assessment/Plan: 1. 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  Physiatrist is providing close team supervision and 24 hour management of active medical problems listed below.  Physiatrist and rehab team continue to assess barriers to discharge/monitor patient progress toward functional and medical goals  Care Tool:  Bathing    Body parts bathed by patient: Chest, Abdomen, Front perineal area, Right upper leg, Left upper leg, Face, Buttocks, Left arm, Right lower leg,  Left lower leg, Right arm   Body parts bathed by helper: Right arm     Bathing assist Assist Level: Minimal Assistance - Patient > 75%     Upper Body Dressing/Undressing Upper body dressing   What is the patient wearing?: Pull over shirt    Upper body assist Assist Level: Supervision/Verbal cueing    Lower Body Dressing/Undressing Lower body dressing      What is the patient wearing?: Underwear/pull up, Pants     Lower body assist Assist for lower body dressing: Minimal Assistance - Patient > 75%     Toileting Toileting    Toileting assist Assist for toileting: Contact Guard/Touching assist     Transfers Chair/bed transfer  Transfers assist     Chair/bed transfer assist level: Contact Guard/Touching assist     Locomotion Ambulation   Ambulation assist      Assist level: Minimal Assistance - Patient > 75% Assistive device: No Device Max distance: 146ft   Walk 10 feet activity   Assist     Assist level: Minimal Assistance - Patient > 75% Assistive device: No Device   Walk 50 feet activity   Assist    Assist level: Minimal Assistance - Patient > 75% Assistive device: No Device    Walk 150 feet activity   Assist Walk 150 feet activity did not occur: Safety/medical concerns  Assist level: Minimal Assistance - Patient > 75% Assistive device: No Device    Walk 10 feet on uneven surface  activity   Assist Walk 10 feet on uneven surfaces activity did not occur: Safety/medical concerns         Wheelchair  Assist Will patient use wheelchair at discharge?: (TBD; however, anticipate pt will be functional ambulator)      Wheelchair assist level: Moderate Assistance - Patient 50 - 74%      Wheelchair 50 feet with 2 turns activity    Assist            Wheelchair 150 feet activity     Assist          Blood pressure 116/77, pulse 70, temperature 97.8 F (36.6 C), resp. rate 18, height 4\' 11"  (1.499 m), weight  61.9 kg, SpO2 100 %.  Medical Problem List and Plan: 1.  Impaired mobility and ADLs secondary to left thalamic hemorrhage             Impairments include expressive aphasia, R sided hemiplegia worse in the upper extremity, R LE ataxia, impaired coordination, impaired motor planning,   Continue CIR PT,OTOT feels pt can go 11/24 Patient should be able to discharge sooner if family training can be initiated.  The patient is medically stable 2.  Antithrombotics: -DVT/anticoagulation:  Pharmaceutical: Lovenox             -antiplatelet therapy: N/A 3. Pain Management: N/A 4. Mood: LCSW to follow for evaluation and support as able              -antipsychotic agents: N/a 5. Neuropsych: This patient is not capable of making decisions on her own behalf. 6. Skin/Wound Care: Routine pressure relief measures.  7. Fluids/Electrolytes/Nutrition: Intake poor-- family to provide meals as able.  8. Hypertension: Patient will benefit from hypertension management education as she was not on any medication prior to this admission.   Continue Norvasc 5mg  QD. Goal SBP <140.   Vitals:   10/29/19 1927 10/30/19 0517  BP: 128/79 116/77  Pulse: 75 70  Resp: 19 18  Temp: 97.7 F (36.5 C) 97.8 F (36.6 C)  SpO2: 99% 100%   Controlled 11/20  9. Constipation: On Senna-Docusate BID.  MiraLAX  Improving    11.  Transaminitis  ALT, AST Stable   LOS: 9 days A FACE TO FACE EVALUATION WAS PERFORMED  11/01/19 10/30/2019, 8:59 AM

## 2019-10-31 DIAGNOSIS — I610 Nontraumatic intracerebral hemorrhage in hemisphere, subcortical: Secondary | ICD-10-CM | POA: Diagnosis not present

## 2019-10-31 NOTE — Plan of Care (Signed)
  Problem: Consults Goal: RH STROKE PATIENT EDUCATION Description: See Patient Education module for education specifics  Outcome: Progressing   Problem: RH BOWEL ELIMINATION Goal: RH STG MANAGE BOWEL WITH ASSISTANCE Description: STG Manage Bowel with min Assistance. Outcome: Progressing Goal: RH STG MANAGE BOWEL W/MEDICATION W/ASSISTANCE Description: STG Manage Bowel with Medication with min Assistance. Outcome: Progressing   Problem: RH BLADDER ELIMINATION Goal: RH STG MANAGE BLADDER WITH ASSISTANCE Description: STG Manage Bladder With min Assistance Outcome: Progressing   Problem: RH SAFETY Goal: RH STG ADHERE TO SAFETY PRECAUTIONS W/ASSISTANCE/DEVICE Description: STG Adhere to Safety Precautions With supervision/min Assistance/Device. Outcome: Progressing   Problem: RH KNOWLEDGE DEFICIT Goal: RH STG INCREASE KNOWLEDGE OF HYPERTENSION Description: Patient/caregiver will verbalize understanding of HTN including monitoring, medications, diet changes, exercise, and follow up care with min/mod assist. Outcome: Progressing Goal: RH STG INCREASE KNOWLEGDE OF HYPERLIPIDEMIA Description: Patient/caregiver will verbalize understanding of HLD including monitoring, medications, diet changes, exercise, and follow up care with min/mod assist. Outcome: Progressing Goal: RH STG INCREASE KNOWLEDGE OF STROKE PROPHYLAXIS Description: Patient/caregiver will verbalize understanding of stroke prophylaxis including monitoring, medications, diet changes, exercise, and follow up care with min/mod assist. Outcome: Progressing   

## 2019-10-31 NOTE — Progress Notes (Signed)
[facility] PHYSICAL MEDICINE & REHABILITATION PROGRESS NOTE   Subjective/Complaints:  Up in bed. Family at bedside. No new problems  ROS: Limited due to language   Objective:   No results found. No results for input(s): WBC, HGB, HCT, PLT in the last 72 hours. No results for input(s): NA, K, CL, CO2, GLUCOSE, BUN, CREATININE, CALCIUM in the last 72 hours.  Intake/Output Summary (Last 24 hours) at 10/31/2019 1115 Last data filed at 10/31/2019 0745 Gross per 24 hour  Intake 297 ml  Output -  Net 297 ml     Physical Exam: Vital Signs Blood pressure 127/90, pulse 80, temperature 98.4 F (36.9 C), resp. rate 19, height 4\' 11"  (1.499 m), weight 61.9 kg, SpO2 100 %. Constitutional: No distress . Vital signs reviewed. HEENT: EOMI, oral membranes moist Neck: supple Cardiovascular: RRR without murmur. No JVD    Respiratory: CTA Bilaterally without wheezes or rales. Normal effort    GI: BS +, non-tender, non-distended  Skin: Warm and dry.  Intact. Psych: Normal mood.  Normal behavior. Musc: No edema in extremities.  No tenderness in extremities. Neurologic: Alert Global aphasia Motor: Limited due to participation, however LUE/LLE appear to be 5/5 proximal distal RUE: 3+/5 proximal distal- unchanged  RLE:  4 --4/5 proximal distal  Assessment/Plan: 1. 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  Physiatrist is providing close team supervision and 24 hour management of active medical problems listed below.  Physiatrist and rehab team continue to assess barriers to discharge/monitor patient progress toward functional and medical goals  Care Tool:  Bathing    Body parts bathed by patient: Chest, Abdomen, Front perineal area, Right upper leg, Left upper leg, Face, Buttocks, Left arm, Right lower leg, Left lower leg, Right arm   Body parts bathed by helper: Right arm      Bathing assist Assist Level: Supervision/Verbal cueing     Upper Body Dressing/Undressing Upper body dressing   What is the patient wearing?: Pull over shirt    Upper body assist Assist Level: Supervision/Verbal cueing    Lower Body Dressing/Undressing Lower body dressing      What is the patient wearing?: Underwear/pull up, Pants     Lower body assist Assist for lower body dressing: Supervision/Verbal cueing     Toileting Toileting    Toileting assist Assist for toileting: Contact Guard/Touching assist     Transfers Chair/bed transfer  Transfers assist     Chair/bed transfer assist level: Contact Guard/Touching assist     Locomotion Ambulation   Ambulation assist      Assist level: Contact Guard/Touching assist Assistive device: No Device Max distance: 140ft   Walk 10 feet activity   Assist     Assist level: Contact Guard/Touching assist Assistive device: No Device   Walk 50 feet activity   Assist    Assist level: Contact Guard/Touching assist Assistive device: No Device    Walk 150 feet activity   Assist Walk 150 feet activity did not occur: Safety/medical concerns  Assist level: Contact Guard/Touching assist Assistive device: No Device    Walk 10 feet on uneven surface  activity   Assist Walk 10 feet on uneven surfaces activity did not occur: Safety/medical concerns         Wheelchair     Assist Will patient use wheelchair at discharge?: (TBD; however, anticipate pt will be functional ambulator)      Wheelchair assist  level: Moderate Assistance - Patient 50 - 74%      Wheelchair 50 feet with 2 turns activity    Assist            Wheelchair 150 feet activity     Assist          Blood pressure 127/90, pulse 80, temperature 98.4 F (36.9 C), resp. rate 19, height 4\' 11"  (1.499 m), weight 61.9 kg, SpO2 100 %.  Medical Problem List and Plan: 1.  Impaired mobility and ADLs secondary to left  thalamic hemorrhage             Impairments include expressive aphasia, R sided hemiplegia worse in the upper extremity, R LE ataxia, impaired coordination, impaired motor planning,   Continue CIR PT,OTOT feels pt can go 11/24 or potentially earlier depending upon fam ed.  2.  Antithrombotics: -DVT/anticoagulation:  Pharmaceutical: Lovenox             -antiplatelet therapy: N/A 3. Pain Management: N/A 4. Mood: LCSW to follow for evaluation and support as able              -antipsychotic agents: N/a 5. Neuropsych: This patient is not capable of making decisions on her own behalf. 6. Skin/Wound Care: Routine pressure relief measures.  7. Fluids/Electrolytes/Nutrition: Intake poor-- family to provide meals as able.  8. Hypertension: Patient will benefit from hypertension management education as she was not on any medication prior to this admission.   Continue Norvasc 5mg  QD. Goal SBP <140.   Vitals:   10/30/19 1936 10/31/19 0549  BP: 135/88 127/90  Pulse: 78 80  Resp: 18 19  Temp: 98.3 F (36.8 C) 98.4 F (36.9 C)  SpO2: 100% 100%   Borderline 11/21---observe today 9. Constipation: On Senna-Docusate BID.  MiraLAX  Improving    11.  Transaminitis  ALT, AST Stable   LOS: 10 days A FACE TO FACE EVALUATION WAS PERFORMED  11/02/19 10/31/2019, 11:15 AM

## 2019-11-01 ENCOUNTER — Inpatient Hospital Stay (HOSPITAL_COMMUNITY): Payer: BLUE CROSS/BLUE SHIELD

## 2019-11-01 ENCOUNTER — Inpatient Hospital Stay (HOSPITAL_COMMUNITY): Payer: BLUE CROSS/BLUE SHIELD | Admitting: Speech Pathology

## 2019-11-01 DIAGNOSIS — I610 Nontraumatic intracerebral hemorrhage in hemisphere, subcortical: Secondary | ICD-10-CM | POA: Diagnosis not present

## 2019-11-01 NOTE — Progress Notes (Signed)
Physical Therapy Session Note  Patient Details  Name: Lindsay Howard MRN: [id number] Date of Birth: 1976-05-03  Today's Date: 11/01/2019 PT Individual Time: 1330-1410 PT Individual Time Calculation (min): 40 min   Short Term Goals: Week 1:  PT Short Term Goal 1 (Week 1): Pt will perform supine<>sit with supervision PT Short Term Goal 1 - Progress (Week 1): Met PT Short Term Goal 2 (Week 1): Pt will perform transfers with min assist PT Short Term Goal 2 - Progress (Week 1): Met PT Short Term Goal 3 (Week 1): Pt will ambulate at least 36f using LRAD with min assist PT Short Term Goal 3 - Progress (Week 1): Met PT Short Term Goal 4 (Week 1): Pt will ascend/descend 4 steps using handrails with min assist PT Short Term Goal 4 - Progress (Week 1): Met Week 2:  PT Short Term Goal 1 (Week 2): = to LTGs based on ELOS  Skilled Therapeutic Interventions/Progress Updates:    [employer] interpreter was not working during session - multiple attempts but it could not access someone to interpret the language during our time together. We worked around it and used [employer] plannerfor activities.   Performed gait in and out of bathroom (somewhat hesitant to use RW) with CGA due impulsivity and decreased attention to the R functionally performing toilet transfers and standing balance with CGA. + void.  Focused on functional gait training to/from therapy with focus on safety with AD, awareness of obstacles, and overall balance requiring supervision to CGA overall and impulsive nature noted x 150'.   NMR to address R attention, RUE/RLE coordination and functional use, postural control retraining, and higher level balance during dynamic gait and balance activities with and without RW for AD. Pt overall supervision to CGA with 1 epsiode of LOB requiring min assist to recover during a turn. Pt to located hidden horseshoes around the room and pick up and manipulate with RUE, then stack onto a table, and place back into a bin.  [facility] obstacle course where pt to toe tap and then step over object with RLE for coordination and balance and LLE for stance control on the L. Holding a tray while performing sit <. Stands with balance items on the tray with focus on coordination and forced use of RUE during functional task as well as postural control re-training with CGA/supervision. Pt able to pick up items with RUE from floor to work on balance and RUE coordination and grasp strength during dynamic gait as well.   End of session returned to bed with supervision.  Therapy Documentation Precautions:  Precautions Precautions: Fall Precaution Comments: R hemiparesis, poor safety awareness Restrictions Weight Bearing Restrictions: No   Pain: Does not appear to have pain.    Therapy/Group: Individual Therapy  GCanary BrimBIvory Broad PT, DPT, CBIS  11/01/2019, 2:13 PM

## 2019-11-01 NOTE — Plan of Care (Signed)
  Problem: Consults Goal: RH STROKE PATIENT EDUCATION Description: See Patient Education module for education specifics  Outcome: Progressing   Problem: RH BOWEL ELIMINATION Goal: RH STG MANAGE BOWEL WITH ASSISTANCE Description: STG Manage Bowel with min Assistance. Outcome: Progressing Goal: RH STG MANAGE BOWEL W/MEDICATION W/ASSISTANCE Description: STG Manage Bowel with Medication with min Assistance. Outcome: Progressing   Problem: RH BLADDER ELIMINATION Goal: RH STG MANAGE BLADDER WITH ASSISTANCE Description: STG Manage Bladder With min Assistance Outcome: Progressing   Problem: RH SAFETY Goal: RH STG ADHERE TO SAFETY PRECAUTIONS W/ASSISTANCE/DEVICE Description: STG Adhere to Safety Precautions With supervision/min Assistance/Device. Outcome: Progressing   Problem: RH KNOWLEDGE DEFICIT Goal: RH STG INCREASE KNOWLEDGE OF HYPERTENSION Description: Patient/caregiver will verbalize understanding of HTN including monitoring, medications, diet changes, exercise, and follow up care with min/mod assist. Outcome: Progressing Goal: RH STG INCREASE KNOWLEGDE OF HYPERLIPIDEMIA Description: Patient/caregiver will verbalize understanding of HLD including monitoring, medications, diet changes, exercise, and follow up care with min/mod assist. Outcome: Progressing Goal: RH STG INCREASE KNOWLEDGE OF STROKE PROPHYLAXIS Description: Patient/caregiver will verbalize understanding of stroke prophylaxis including monitoring, medications, diet changes, exercise, and follow up care with min/mod assist. Outcome: Progressing   

## 2019-11-01 NOTE — Progress Notes (Signed)
Speech Language Pathology Daily Session Note  Patient Details  Name: Lindsay Howard MRN: [id number] Date of Birth: June 20, 1976  Today's Date: 11/01/2019 SLP Individual Time: 0729-0813 SLP Individual Time Calculation (min): 44 min  Short Term Goals: Week 2: SLP Short Term Goal 1 (Week 2): STGs=LTGs due ELOS  Skilled Therapeutic Interventions: Pt was seen for skilled ST targeting cognitive goals. SLP provided set up assist with regular texture/thin liquid breakfast tray. Pt demonstrated ability to locate items on right side of tray independently. Min A gestural and cues provided for basic problem solving and initiation during meal. SLP further facilitated session with Min A visual/gestural cues for problem solving and error awareness when constructing basic PEG board designs. Cueing increased to Mod-Max during construction of semi-complex designs. Pt's son was present and supportive at bedside, however also provided verbal cues during semi-complex PEG tasks. Pt was left sitting at edge of bed with alarm set, call bell at side, family member still present. Continue per current plan of care.      Pain Pain Assessment Pain Scale: Faces Faces Pain Scale: No hurt  Therapy/Group: Individual Therapy  Arbutus Leas 11/01/2019, 8:58 AM

## 2019-11-01 NOTE — Progress Notes (Signed)
Physical Therapy Session Note  Patient Details  Name: Lindsay Howard MRN: [id number] Date of Birth: [birth year]  Today's Date: 11/01/2019 PT Individual Time: 1004-1059 PT Individual Time Calculation (min): 55 min   Short Term Goals: Week 2:  PT Short Term Goal 1 (Week 2): = to LTGs based on ELOS  Skilled Therapeutic Interventions/Progress Updates:    Pt supine in bed upon PT arrival, agreeable to therapy tx and denies pain. Pt transferred to sitting EOB with supervision and verbal cues for safety. Pt's son and niece present this session for family education, therapist provided education on pt's stroke deficits including poor safety awareness, R inattention and decreased awareness of deficits. Pt ambulated to the gym with RW and supervision x 150 ft, therapist discussed guarding techniques and cues to provided for R attention. Pt ambulated x 150 ft with RW and supervision/stand by assist from niece. Pt performed car transfer this session with RW and supervision, cues for techniques/safety. Ambulated throughout rehab apartment and performed couch transfer this session with supervision. Pt performed peg board task with use of R hand for neuro re-ed and working on R attention, therapist discussed with family about having her use R UE for as much as possible during functional tasks at home. Pt performed towel folding task, therapist educated family that folding laundry at home would be a great functional task to incorporate R UE use. Pt with noted blood on chair after standing up, pt reports being on her period. Pt ambulated back to room to use the bathroom, CGA for standing balance and transfer without AD, assist to change pants and underwear, therapist provided clean pad. Therapist educated pt's family on need for help with bathing/dressing at home. Pt ambulated from room<>gym without an AD and CGA from her son, son standing on pt's R side and demonstrates appropriate guarding. Pt ascended/descended 4 steps with R  rail per home set up, CGA from her son. Pt ambulated back to room and transferred to supine, left with bed alarm set and in care of family.   Therapy Documentation Precautions:  Precautions Precautions: Fall Precaution Comments: R hemiparesis, poor safety awareness Restrictions Weight Bearing Restrictions: No    Therapy/Group: Individual Therapy  Netta Corrigan, PT, DPT, CSRS 11/01/2019, 7:53 AM

## 2019-11-01 NOTE — Progress Notes (Signed)
[place] PHYSICAL MEDICINE & REHABILITATION PROGRESS NOTE   Subjective/Complaints:  Up in bed. Family at bedside. No new problems  ROS: Limited due to language   Objective:   No results found. No results for input(s): WBC, HGB, HCT, PLT in the last 72 hours. No results for input(s): NA, K, CL, CO2, GLUCOSE, BUN, CREATININE, CALCIUM in the last 72 hours.  Intake/Output Summary (Last 24 hours) at 11/01/2019 1100 Last data filed at 11/01/2019 0900 Gross per 24 hour  Intake 478 ml  Output -  Net 478 ml     Physical Exam: Vital Signs Blood pressure 119/80, pulse 62, temperature 97.7 F (36.5 C), resp. rate 19, height 4\' 11"  (1.499 m), weight 61.9 kg, SpO2 99 %. Constitutional: No distress . Vital signs reviewed. HEENT: EOMI, oral membranes moist Neck: supple Cardiovascular: RRR without murmur. No JVD    Respiratory: CTA Bilaterally without wheezes or rales. Normal effort    GI: BS +, non-tender, non-distended  Skin: Warm and dry.  Intact. Psych: Normal mood.  Normal behavior. Musc: No edema in extremities.  No tenderness in extremities. Neurologic: Alert Global aphasia Motor: Limited due to participation, however LUE/LLE appear to be 5/5 proximal distal RUE: 3+/5 proximal distal- unchanged  RLE:  4 --4/5 proximal distal  Assessment/Plan: 1. 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  Physiatrist is providing close team supervision and 24 hour management of active medical problems listed below.  Physiatrist and rehab team continue to assess barriers to discharge/monitor patient progress toward functional and medical goals  Care Tool:  Bathing    Body parts bathed by patient: Chest, Abdomen, Front perineal area, Right upper leg, Left upper leg, Face, Buttocks, Left arm, Right lower leg, Left lower leg, Right arm   Body parts bathed by helper: Right arm      Bathing assist Assist Level: Supervision/Verbal cueing     Upper Body Dressing/Undressing Upper body dressing   What is the patient wearing?: Pull over shirt    Upper body assist Assist Level: Supervision/Verbal cueing    Lower Body Dressing/Undressing Lower body dressing      What is the patient wearing?: Underwear/pull up, Pants     Lower body assist Assist for lower body dressing: Supervision/Verbal cueing     Toileting Toileting    Toileting assist Assist for toileting: Contact Guard/Touching assist     Transfers Chair/bed transfer  Transfers assist     Chair/bed transfer assist level: Contact Guard/Touching assist     Locomotion Ambulation   Ambulation assist      Assist level: Contact Guard/Touching assist Assistive device: No Device Max distance: 177ft   Walk 10 feet activity   Assist     Assist level: Contact Guard/Touching assist Assistive device: No Device   Walk 50 feet activity   Assist    Assist level: Contact Guard/Touching assist Assistive device: No Device    Walk 150 feet activity   Assist Walk 150 feet activity did not occur: Safety/medical concerns  Assist level: Contact Guard/Touching assist Assistive device: No Device    Walk 10 feet on uneven surface  activity   Assist Walk 10 feet on uneven surfaces activity did not occur: Safety/medical concerns         Wheelchair     Assist Will patient use wheelchair at discharge?: (TBD; however, anticipate pt will be functional ambulator)      Wheelchair assist  level: Moderate Assistance - Patient 50 - 74%      Wheelchair 50 feet with 2 turns activity    Assist            Wheelchair 150 feet activity     Assist          Blood pressure 119/80, pulse 62, temperature 97.7 F (36.5 C), resp. rate 19, height 4\' 11"  (1.499 m), weight 61.9 kg, SpO2 99 %.  Medical Problem List and Plan: 1.  Impaired mobility and ADLs secondary to left  thalamic hemorrhage             Impairments include expressive aphasia, R sided hemiplegia worse in the upper extremity, R LE ataxia, impaired coordination, impaired motor planning,   Continue CIR PT,OTOT feels pt can go 11/24 or potentially earlier depending upon fam ed.  2.  Antithrombotics: -DVT/anticoagulation:  Pharmaceutical: Lovenox             -antiplatelet therapy: N/A 3. Pain Management: N/A 4. Mood: LCSW to follow for evaluation and support as able              -antipsychotic agents: N/a 5. Neuropsych: This patient is not capable of making decisions on her own behalf. 6. Skin/Wound Care: Routine pressure relief measures.  7. Fluids/Electrolytes/Nutrition: Intake poor-- family to provide meals as able.  8. Hypertension: Patient will benefit from hypertension management education as she was not on any medication prior to this admission.   Continue Norvasc 5mg  QD. Goal SBP <140.   Vitals:   10/31/19 1936 11/01/19 0535  BP: 128/78 119/80  Pulse: 62 62  Resp: 19 19  Temp: 98.2 F (36.8 C) 97.7 F (36.5 C)  SpO2: 100% 99%   Borderline 11/21, well controlled 11/22.  9. Constipation: On Senna-Docusate BID.  MiraLAX  Improving 11.  Transaminitis  ALT, AST Stable   LOS: 11 days A FACE TO FACE EVALUATION WAS PERFORMED  Vinita Prentiss P Raymonda Pell 11/01/2019, 11:00 AM

## 2019-11-02 ENCOUNTER — Inpatient Hospital Stay (HOSPITAL_COMMUNITY): Payer: BLUE CROSS/BLUE SHIELD | Admitting: Speech Pathology

## 2019-11-02 ENCOUNTER — Inpatient Hospital Stay (HOSPITAL_COMMUNITY): Payer: BLUE CROSS/BLUE SHIELD | Admitting: Occupational Therapy

## 2019-11-02 ENCOUNTER — Inpatient Hospital Stay (HOSPITAL_COMMUNITY): Payer: BLUE CROSS/BLUE SHIELD

## 2019-11-02 DIAGNOSIS — I610 Nontraumatic intracerebral hemorrhage in hemisphere, subcortical: Secondary | ICD-10-CM | POA: Diagnosis not present

## 2019-11-02 LAB — CBC
HCT: 37.8 % (ref 36.0–46.0)
Hemoglobin: 12.5 g/dL (ref 12.0–15.0)
MCH: 25.9 pg — ABNORMAL LOW (ref 26.0–34.0)
MCHC: 33.1 g/dL (ref 30.0–36.0)
MCV: 78.4 fL — ABNORMAL LOW (ref 80.0–100.0)
Platelets: 217 10*3/uL (ref 150–400)
RBC: 4.82 MIL/uL (ref 3.87–5.11)
RDW: 15 % (ref 11.5–15.5)
WBC: 4.4 10*3/uL (ref 4.0–10.5)
nRBC: 0 % (ref 0.0–0.2)

## 2019-11-02 NOTE — Progress Notes (Signed)
Speech Language Pathology Discharge Summary  Patient Details  Name: Lindsay Howard MRN: [id number] Date of Birth: 12-Jun-1976  Today's Date: 11/02/2019 SLP Individual Time: 7169-6789 SLP Individual Time Calculation (min): 55 min   Skilled Therapeutic Interventions:  Skilled treatment session focused on cognitive goals. SLP facilitated session by providing Min A visual cues for problem solving during a basic sorting/scanning task while utilizing her RUE. Patient independently reported she was discharging home tomorrow, therefore, SLP provided education to the patient in regards to strategies to utilize at home to maximize overall safety and recall of functional information, especially in regards to needing 24 hour supervision. She verbalized understanding of all information and asked questions about follow-up therapy. All questions were answered and family education was completed this weekend. Patient left supine in bed with all alarm on and all needs within reach. Continue with current plan of care.    Patient has met 6 of 6 long term goals.  Patient to discharge at overall Mod level.   Reasons goals not met: N/A  Clinical Impression/Discharge Summary: Patient has made functional gains and has met 6 of 6 LTGs this admission. Currently, patient requires overall Min-Mod A multimodal cues to complete functional and familiar tasks safely in regards to problem solving, recall, and awareness. Suspect patient is close to her cognitive baseline, however, family is not present to confirm. Patient is also 100% intelligible at the conversation level with overall Mod I.  Patient and family education is complete and patient will discharge home with 24 hour supervision from family. Patient would benefit from f/u home health SLP services to maximize her cognitive functioning and overall functional independence.   Care Partner:  Caregiver Able to Provide Assistance: Yes  Type of Caregiver Assistance:  Physical;Cognitive  Recommendation:  Home Health SLP;24 hour supervision/assistance  Rationale for SLP Follow Up: Maximize cognitive function and independence   Equipment: N/A   Reasons for discharge: Discharged from hospital;Treatment goals met   Patient/Family Agrees with Progress Made and Goals Achieved: Yes    Lindsay Howard, [place] 11/02/2019, 10:54 AM

## 2019-11-02 NOTE — Progress Notes (Signed)
[place] PHYSICAL MEDICINE & REHABILITATION PROGRESS NOTE  Online Kaya interpreter    Subjective/Complaints:  Family training over the weekend went well per SW  Labs reviewed  Pt without c/os aware of d/c in am  ROS: per interpreter no CP, SOB, N/V/D Objective:   No results found. Recent Labs    11/02/19 0634  WBC 4.4  HGB 12.5  HCT 37.8  PLT 217   No results for input(s): NA, K, CL, CO2, GLUCOSE, BUN, CREATININE, CALCIUM in the last 72 hours.  Intake/Output Summary (Last 24 hours) at 11/02/2019 0911 Last data filed at 11/02/2019 0745 Gross per 24 hour  Intake 540 ml  Output -  Net 540 ml     Physical Exam: Vital Signs Blood pressure 124/84, pulse 67, temperature 97.9 F (36.6 C), resp. rate 18, height 4\' 11"  (1.499 m), weight 61.9 kg, SpO2 99 %. Constitutional: No distress . Vital signs reviewed. HEENT: EOMI, oral membranes moist Neck: supple Cardiovascular: RRR without murmur. No JVD    Respiratory: CTA Bilaterally without wheezes or rales. Normal effort    GI: BS +, non-tender, non-distended  Skin: Warm and dry.  Intact. Psych: Normal mood.  Normal behavior. Musc: No edema in extremities.  No tenderness in extremities. Neurologic: Alert Global aphasia Motor: Limited due to participation, however LUE/LLE appear to be 5/5 proximal distal RUE: 4-/5 proximal distal- unchanged  RLE:  4+/5 proximal distal  Assessment/Plan: 1. 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  Physiatrist is providing close team supervision and 24 hour management of active medical problems listed below.  Physiatrist and rehab team continue to assess barriers to discharge/monitor patient progress toward functional and medical goals  Care Tool:  Bathing    Body parts bathed by patient: Chest, Abdomen, Front perineal area, Right upper leg, Left upper leg, Face, Buttocks,  Left arm, Right lower leg, Left lower leg, Right arm   Body parts bathed by helper: Right arm     Bathing assist Assist Level: Supervision/Verbal cueing     Upper Body Dressing/Undressing Upper body dressing   What is the patient wearing?: Pull over shirt    Upper body assist Assist Level: Supervision/Verbal cueing    Lower Body Dressing/Undressing Lower body dressing      What is the patient wearing?: Underwear/pull up, Pants     Lower body assist Assist for lower body dressing: Supervision/Verbal cueing     Toileting Toileting    Toileting assist Assist for toileting: Contact Guard/Touching assist     Transfers Chair/bed transfer  Transfers assist     Chair/bed transfer assist level: Contact Guard/Touching assist Chair/bed transfer assistive device: Programmer, multimedia   Ambulation assist      Assist level: Contact Guard/Touching assist Assistive device: Walker-rolling Max distance: 150'   Walk 10 feet activity   Assist     Assist level: Contact Guard/Touching assist Assistive device: Walker-rolling   Walk 50 feet activity   Assist    Assist level: Contact Guard/Touching assist Assistive device: Walker-rolling    Walk 150 feet activity   Assist Walk 150 feet activity did not occur: Safety/medical concerns  Assist level: Contact Guard/Touching assist Assistive device: Walker-rolling    Walk 10 feet on uneven surface  activity   Assist Walk 10 feet on uneven surfaces activity did not occur: Safety/medical concerns         Wheelchair  Assist Will patient use wheelchair at discharge?: (TBD; however, anticipate pt will be functional ambulator)      Wheelchair assist level: Moderate Assistance - Patient 50 - 74%      Wheelchair 50 feet with 2 turns activity    Assist            Wheelchair 150 feet activity     Assist          Blood pressure 124/84, pulse 67, temperature 97.9 F (36.6  C), resp. rate 18, height 4\' 11"  (1.499 m), weight 61.9 kg, SpO2 99 %.  Medical Problem List and Plan: 1.  Impaired mobility and ADLs secondary to left thalamic hemorrhage             Impairments include expressive aphasia, R sided hemiplegia worse in the upper extremity, R LE ataxia, impaired coordination, impaired motor planning,   Plan d/c in am  2.  Antithrombotics: -DVT/anticoagulation:  Pharmaceutical: Lovenox- may d/c             -antiplatelet therapy: N/A 3. Pain Management: N/A 4. Mood: LCSW to follow for evaluation and support as able              -antipsychotic agents: N/a 5. Neuropsych: This patient is not capable of making decisions on her own behalf. 6. Skin/Wound Care: Routine pressure relief measures.  7. Fluids/Electrolytes/Nutrition: Intake poor-- family to provide meals as able.  8. Hypertension: Patient will benefit from hypertension management education as she was not on any medication prior to this admission.   Continue Norvasc 5mg  QD. Goal SBP <140.   Vitals:   11/01/19 1948 11/02/19 0427  BP: 116/74 124/84  Pulse: 77 67  Resp: 18 18  Temp: 97.7 F (36.5 C) 97.9 F (36.6 C)  SpO2: 100% 99%  , well controlled 11/23.  9. Constipation: On Senna-Docusate BID.  MiraLAX  Improving 11.  Transaminitis  ALT, AST Stable   LOS: 12 days A FACE TO FACE EVALUATION WAS PERFORMED  11/04/19 11/02/2019, 9:11 AM

## 2019-11-02 NOTE — Progress Notes (Addendum)
Social Work Patient ID: Lindsay Howard, female   DOB: 11-09-1976, 43 y.o.   MRN: [id number] Family education went well over the weekend and team feels will be ready for discharge tomorrow. Have informed MD awaiting confirmation. Work toward discharge tomorrow. MD on board with discharge tomorrow

## 2019-11-02 NOTE — Progress Notes (Signed)
Patient called nurse to room c/o a nose bleed. Nurse assisted patient by pinching nose and having patient tilt her head back. Nose bleed stopped with-in 2-3 mins.  Nicholes Rough, RN

## 2019-11-02 NOTE — Progress Notes (Signed)
Social Work Discharge Note   The overall goal for the admission was met for:   Discharge location: Yes-HOME WITH FAMILY BETWEEN ALL OF THEM WILL PROVIDE 24 HR SUPERVISION CARE  Length of Stay: Yes-13 DAYS  Discharge activity level: Yes-SUPERVISION-CGA LEVEL  Home/community participation: Yes  Services provided included: MD, RD, PT, OT, SLP, RN, CM, TR, Pharmacy and SW  Financial Services: Other: PENDING MEDICAID  Follow-up services arranged: Home Health: [facility]-PT,OT,SP, DME: [facility] and Patient/Family has no preference for HH/DME agencies  Comments (or additional information):APPLYING FOR [facility] PCP [place] 12/2 @ 2:50 PM.  Patient/Family verbalized understanding of follow-up arrangements: Yes  Individual responsible for coordination of the follow-up plan: FAMILY-HUSBAND, SISTER AND NIECE  Confirmed correct DME delivered: Elease Hashimoto 11/02/2019    Elease Hashimoto

## 2019-11-02 NOTE — Progress Notes (Signed)
Occupational Therapy Discharge Summary  Patient Details  Name: Lindsay Howard MRN: [id number] Date of Birth: 08/28/1976  Today's Date: 11/02/2019 OT Individual Time: 1245-8099 OT Individual Time Calculation (min): 68 min   Session Note:  Pt completed bathing and dressing sit to stand during the session.  She was able to ambulate to the shower with supervision and complete bathing sit to stand at the same level.  She was able to integrate the RUE with supervision for bathing tasks with min demonstrational cueing to sequence bathing overall.  Dressing was performed at the EOB with supervision and min demonstrational cueing to pull up the right side of her brief and pants completely.  She was able to complete UB dressing including donning bra and shirt with supervision.  Next, had her ambulate over to the sink to complete grooming tasks of brushing her hair and her teeth, both with supervision.  She then ambulated out to the dayroom with use of the RW and supervision.  Once there, she worked on [employer] coordination and reach by picking up small pegs and placing them in the peg board with increased time.  Next, had her ambulate to the gym where therapist issued her small foam pieces to work on picking up and placing in a cup with the RUE.  She was able to also complete in-hand manipulation of 3 pieces and place them in the cup as well.  Noted more difficulty lifting her arm to the cup and removing it without knocking the cup over.  Finished session with pt returning to the room with use of the walker at supervision again and opting to transfer back to the bed.  Educated pt on completion of shoulder flexion in bed on the RUE with emphasis on elbow and wrist extension.  Also gave her the bottle of deodorant with visual cueing to work on taking the top off and on with the RUE.  Call button and phone in reach with safety alarm in place.    Patient has met 13 of 13 long term goals due to improved activity tolerance,  improved balance, postural control, ability to compensate for deficits, functional use of  RIGHT upper and RIGHT lower extremity, improved attention, improved awareness and improved coordination.  Patient to discharge at overall Supervision level.  Patient's care partner is independent to provide the necessary physical and cognitive assistance at discharge.    Reasons goals not met: NA  Recommendation:  Patient will benefit from ongoing skilled OT services in home health setting to continue to advance functional skills in the area of BADL.  Pt still demonstrates decreased RUE functional use and coordination for selfcare tasks as well as decreased balance.  Feel she will benefit from follow-up HHOT to further address these deficits and increase her functional completion of ADLs at a modified independent to independent level.    Equipment: No equipment provided  Reasons for discharge: treatment goals met and discharge from hospital  Patient/family agrees with progress made and goals achieved: Yes  OT Discharge Precautions/Restrictions  Precautions Precautions: Fall Precaution Comments: R hemiparesis, decreased awareness Restrictions Weight Bearing Restrictions: No  Pain  No report of pain  ADL ADL Eating: Independent Where Assessed-Eating: Chair Grooming: Supervision/safety Where Assessed-Grooming: Standing at sink Upper Body Bathing: Supervision/safety Where Assessed-Upper Body Bathing: Shower Lower Body Bathing: Supervision/safety Where Assessed-Lower Body Bathing: Shower Upper Body Dressing: Supervision/safety Where Assessed-Upper Body Dressing: Standing at sink Lower Body Dressing: Supervision/safety Where Assessed-Lower Body Dressing: Edge of bed Toileting: Supervision/safety Where Assessed-Toileting:  Glass blower/designer: Close supervision Toilet Transfer Method: Counselling psychologist: Raised toilet seat Tub/Shower Transfer: Close  supervison Clinical cytogeneticist Method: Optometrist: Energy manager: Close supervision Social research officer, government Method: Heritage manager: Gaffer Baseline Vision/History: No visual deficits Patient Visual Report: No change from baseline Vision Assessment?: No apparent visual deficits Perception  Perception: Impaired Inattention/Neglect: Does not attend to right side of body(Mild right hemi-inattention to the RUE at times.) Praxis Praxis: Intact Cognition Overall Cognitive Status: Impaired/Different from baseline Arousal/Alertness: Awake/alert Orientation Level: Oriented to person;Oriented to place;Oriented to situation;Disoriented to time Attention: Sustained Sustained Attention: Appears intact Memory: Impaired Memory Impairment: Decreased short term memory Decreased Short Term Memory: Verbal basic;Functional basic Awareness: Impaired Awareness Impairment: Emergent impairment Problem Solving: Impaired Problem Solving Impairment: Functional basic;Functional complex Safety/Judgment: Impaired Sensation Sensation Light Touch: Impaired by gross assessment Light Touch Impaired Details: Impaired RUE;Impaired RLE Proprioception: Appears Intact Coordination Gross Motor Movements are Fluid and Coordinated: No Fine Motor Movements are Fluid and Coordinated: No Coordination and Movement Description: Pt uses the RUE at a diminshed level for selfcare tasks.  She is able to use it for fastening her bra, washing her left arm, and assisting with donning her socks, still with decreased efficiency with all these tasks. Motor  Motor Motor: Hemiplegia;Abnormal postural alignment and control Motor - Discharge Observations: mild right hemiparesis Mobility  Bed Mobility Bed Mobility: Supine to Sit;Sit to Supine Supine to Sit: Supervision/Verbal cueing Sit to Supine: Supervision/Verbal cueing Transfers Sit to Stand:  Supervision/Verbal cueing Stand to Sit: Supervision/Verbal cueing  Trunk/Postural Assessment  Cervical Assessment Cervical Assessment: Within Functional Limits Thoracic Assessment Thoracic Assessment: Within Functional Limits Lumbar Assessment Lumbar Assessment: Within Functional Limits Postural Control Postural Control: Deficits on evaluation  Balance Balance Balance Assessed: Yes Static Sitting Balance Static Sitting - Balance Support: Feet supported Static Sitting - Level of Assistance: 7: Independent Dynamic Sitting Balance Dynamic Sitting - Balance Support: Feet supported Dynamic Sitting - Level of Assistance: 5: Stand by assistance Static Standing Balance Static Standing - Balance Support: During functional activity;No upper extremity supported Static Standing - Level of Assistance: 5: Stand by assistance Dynamic Standing Balance Dynamic Standing - Balance Support: During functional activity Dynamic Standing - Level of Assistance: 5: Stand by assistance Extremity/Trunk Assessment RUE Assessment RUE Assessment: Exceptions to [facility] Passive Range of Motion (PROM) Comments: WFLs General Strength Comments: Pt demonstrates Brunnstrum stage V movement in the right arm with slight synergy pattern still present with shoulder flexion.  She demonstrates Brunnstrum stage VI in the hand with isolated movement and the ability to oppose the thumb to all digits.  Still with decreased efficiency however with 9 hole peg test taking 1:31 compared to 30 on the left. LUE Assessment LUE Assessment: Within Functional Limits   Camylle Whicker OTR/L 11/02/2019, 4:21 PM

## 2019-11-02 NOTE — Plan of Care (Signed)
  Problem: Consults Goal: RH STROKE PATIENT EDUCATION Description: See Patient Education module for education specifics  Outcome: Progressing   Problem: RH BOWEL ELIMINATION Goal: RH STG MANAGE BOWEL WITH ASSISTANCE Description: STG Manage Bowel with min Assistance. Outcome: Progressing Goal: RH STG MANAGE BOWEL W/MEDICATION W/ASSISTANCE Description: STG Manage Bowel with Medication with min Assistance. Outcome: Progressing   Problem: RH BLADDER ELIMINATION Goal: RH STG MANAGE BLADDER WITH ASSISTANCE Description: STG Manage Bladder With min Assistance Outcome: Progressing   Problem: RH SAFETY Goal: RH STG ADHERE TO SAFETY PRECAUTIONS W/ASSISTANCE/DEVICE Description: STG Adhere to Safety Precautions With supervision/min Assistance/Device. Outcome: Progressing   Problem: RH KNOWLEDGE DEFICIT Goal: RH STG INCREASE KNOWLEDGE OF HYPERTENSION Description: Patient/caregiver will verbalize understanding of HTN including monitoring, medications, diet changes, exercise, and follow up care with min/mod assist. Outcome: Progressing Goal: RH STG INCREASE KNOWLEGDE OF HYPERLIPIDEMIA Description: Patient/caregiver will verbalize understanding of HLD including monitoring, medications, diet changes, exercise, and follow up care with min/mod assist. Outcome: Progressing Goal: RH STG INCREASE KNOWLEDGE OF STROKE PROPHYLAXIS Description: Patient/caregiver will verbalize understanding of stroke prophylaxis including monitoring, medications, diet changes, exercise, and follow up care with min/mod assist. Outcome: Progressing

## 2019-11-02 NOTE — Progress Notes (Signed)
Physical Therapy Discharge Summary  Patient Details  Name: Lindsay Howard MRN: [id number] Date of Birth: June 27, 1976  Today's Date: 11/02/2019 PT Individual Time: 7408-1448 PT Individual Time Calculation (min): 70 min    Patient has met 9 of 9 long term goals due to improved activity tolerance, improved balance, improved postural control, increased strength, ability to compensate for deficits, improved attention and improved awareness.  Patient to discharge at an ambulatory level Supervision.   Patient's care partner is independent to provide the necessary cognitive assistance at discharge- niece and son completed hand son family education.   All goals met.   Recommendation:  Patient will benefit from ongoing skilled PT services in home health setting to continue to advance safe functional mobility, address ongoing impairments in balance, awareness, strength, hemiparesis, and minimize fall risk.  Equipment: youth sized RW  Reasons for discharge: treatment goals met  Patient/family agrees with progress made and goals achieved: Yes   PT Treatment Interventions: Pt supine in bed upon PT arrival, agreeable to therapy tx and denies pain. Pt transferred to sitting EOB with supervision, ambulated x 200 ft without AD and with supervision. Pt performed car transfer this session with supervision, cues for safety/techniques. Pt ambulated on unlevel surfaces across ramp incline/decline this session with supervision, ambulated across mulch and stepped down from curb with supervision/CGA. Pt ambulated to the gym and ascended/descended 12 steps with single rail and CGA, reciprocal pattern with visual/tactile cues for safety. Pt ambulated to the dayroom and used nustep this session for strengthening and endurance x 10 minutes while on resistance level 5, cues for attention to and use of R UE. Pt ambulated back to the gym and worked on standing balance without UE support, R attention and R UE neuro re-ed to perform  the following tasks: reaching overhead to take clothespins off the net x 1 trial, picking clothespins up off the ground and placing them in bucket x 1 trials, picking clothespins up off the ground and then hanging on basketball net x 1 trial, all with supervision and visual/tactile cues for R UE use and attention. Pt worked on dynamic standing balance without UE support to perform the following tasks- toe taps on aerobic step 2 x 15 per LE, lateral toe taps on aerobic step x 15 per LE, and step ups on aerobic step x 10 per LE. Pt ambulated back to her room and transferred on/off toilet, continent of bladder and performed pericare/clothing management with supervision. Pt left supine in bed at end of session with needs in reach and bed alarm set.   PT Discharge Precautions/Restrictions Precautions Precautions: Fall Precaution Comments: R hemiparesis, decreased awareness Restrictions Weight Bearing Restrictions: No Pain   denies pain  Cognition Overall Cognitive Status: Impaired/Different from baseline Arousal/Alertness: Awake/alert Orientation Level: Oriented to person;Oriented to place;Oriented to situation;Disoriented to time Attention: Sustained Sustained Attention: Appears intact Memory: Impaired Memory Impairment: Decreased short term memory Awareness: Impaired Awareness Impairment: Emergent impairment Problem Solving: Impaired Problem Solving Impairment: Functional basic Safety/Judgment: Impaired Sensation Sensation Light Touch: Impaired by gross assessment Light Touch Impaired Details: Impaired RUE;Impaired RLE Proprioception: Appears Intact Coordination Gross Motor Movements are Fluid and Coordinated: No Fine Motor Movements are Fluid and Coordinated: No Coordination and Movement Description: impaired due to R hemiparesis and R inattention, Motor  Motor Motor: Hemiplegia;Abnormal postural alignment and control Motor - Discharge Observations: R hemiparesis  Mobility Bed  Mobility Bed Mobility: Supine to Sit;Sit to Supine Supine to Sit: Supervision/Verbal cueing Sit to Supine: Supervision/Verbal cueing Transfers Transfers:  Sit to Stand;Stand to Sit;Stand Pivot Transfers Sit to Stand: Supervision/Verbal cueing Stand to Sit: Supervision/Verbal cueing Stand Pivot Transfers: Supervision/Verbal cueing Stand Pivot Transfer Details: Visual cues for safe use of DME/AE;Visual cues/gestures for precautions/safety;Visual cues/gestures for sequencing Transfer (Assistive device): None Locomotion  Gait Ambulation: Yes Gait Assistance: Supervision/Verbal cueing Gait Distance (Feet): 200 Feet Assistive device: Rolling walker;None Gait Assistance Details: Visual cues/gestures for precautions/safety Gait Gait: Yes Gait Pattern: Impaired Gait Pattern: Decreased stance time - right;Decreased stride length;Decreased step length - right;Poor foot clearance - right Gait velocity: decreased Stairs / Additional Locomotion Stairs: Yes Stairs Assistance: Contact Guard/Touching assist Stair Management Technique: One rail Left Number of Stairs: 12 Height of Stairs: 6 Wheelchair Mobility Wheelchair Mobility: No  Trunk/Postural Assessment  Cervical Assessment Cervical Assessment: Within Functional Limits Thoracic Assessment Thoracic Assessment: Within Functional Limits Lumbar Assessment Lumbar Assessment: Within Functional Limits Postural Control Postural Control: Deficits on evaluation  Balance Balance Balance Assessed: Yes Static Sitting Balance Static Sitting - Level of Assistance: 5: Stand by assistance Dynamic Sitting Balance Dynamic Sitting - Level of Assistance: 5: Stand by assistance Static Standing Balance Static Standing - Balance Support: During functional activity;No upper extremity supported Static Standing - Level of Assistance: 5: Stand by assistance Dynamic Standing Balance Dynamic Standing - Balance Support: No upper extremity supported;During  functional activity Dynamic Standing - Level of Assistance: 5: Stand by assistance;4: Min assist Extremity Assessment  RLE Assessment RLE Assessment: Exceptions to [facility] RLE Strength Right Hip Flexion: 3+/5 Right Knee Flexion: 3+/5 Right Knee Extension: 3+/5 Right Ankle Dorsiflexion: 3-/5 Right Ankle Plantar Flexion: 3-/5 LLE Assessment LLE Assessment: Within Functional Limits    Netta Corrigan, PT, DPT, CSRS 11/02/2019, 2:02 PM

## 2019-11-03 DIAGNOSIS — I610 Nontraumatic intracerebral hemorrhage in hemisphere, subcortical: Secondary | ICD-10-CM | POA: Diagnosis not present

## 2019-11-03 MED ORDER — PANTOPRAZOLE SODIUM 40 MG PO TBEC: 40.0000 mg | DELAYED_RELEASE_TABLET | Freq: Every day | ORAL | 0 refills | Status: DC

## 2019-11-03 MED ORDER — LIP MEDEX EX OINT: TOPICAL_OINTMENT | CUTANEOUS | 0 refills | Status: DC | PRN

## 2019-11-03 MED ORDER — AMLODIPINE BESYLATE 10 MG PO TABS: 10.0000 mg | ORAL_TABLET | Freq: Every day | ORAL | 0 refills | Status: DC

## 2019-11-03 MED ORDER — GABAPENTIN 100 MG PO CAPS: 100.0000 mg | ORAL_CAPSULE | Freq: Three times a day (TID) | ORAL | 0 refills | Status: DC

## 2019-11-03 MED ORDER — SENNOSIDES-DOCUSATE SODIUM 8.6-50 MG PO TABS: 2.0000 | ORAL_TABLET | Freq: Every day | ORAL | 0 refills | Status: DC

## 2019-11-03 NOTE — Discharge Instructions (Signed)
Inpatient Rehab Discharge Instructions  Lindsay Howard Discharge date and time:  11/03/19  Activities/Precautions/ Functional Status: Activity: no lifting, driving, or strenuous exercise till cleared by MD Diet: low fat, low cholesterol diet Wound Care: none needed    Functional status:  ___ No restrictions     ___ Walk up steps independently _X__ 24/7 supervision/assistance   ___ Walk up steps with assistance ___ Intermittent supervision/assistance  ___ Bathe/dress independently ___ Walk with walker     ___ Bathe/dress with assistance ___ Walk Independently    ___ Shower independently _X__ Walk with supervision.     _X__ Shower with supervision _X__ No alcohol     ___ Return to work/school ________    Special Instructions: 1. Family needs to give patient her medications daily so that she takes it as prescribed.   2. Also need to be with her when she is walking because her balance is not good. Do not let her go out without family.  3. Therapy will come and continue to work with her. They will tell you when she is safe to be left alone.        COMMUNITY REFERRALS UPON DISCHARGE:    Home Health:   PT, OT, [place]   Date of last service:11/03/2019    Medical Equipment/Items Ordered:[facility] [place]  Agency/Supplier:[facility]   [place] [phone]   STROKE/TIA DISCHARGE INSTRUCTIONS SMOKING Cigarette smoking nearly doubles your risk of having a stroke & is the single most alterable risk factor  If you smoke or have smoked in the last 12 months, you are advised to quit smoking for your health.  Most of the excess cardiovascular risk related to smoking disappears within a year of stopping.  Ask you doctor about anti-smoking medications  [place] Quit Line: 1-800-QUIT NOW  Free Smoking Cessation Classes [phone]  CHOLESTEROL Know your levels; limit fat  & cholesterol in your diet  Lipid Panel     Component Value Date/Time   CHOL 177 10/17/2019 0432   TRIG 156 (H) 10/17/2019 0432   HDL 23 (L) 10/17/2019 0432   CHOLHDL 7.7 10/17/2019 0432   VLDL 31 10/17/2019 0432   LDLCALC 123 (H) 10/17/2019 0432      Many patients benefit from treatment even if their cholesterol is at goal.  Goal: Total Cholesterol (CHOL) less than 160  Goal:  Triglycerides (TRIG) less than 150  Goal:  HDL greater than 40  Goal:  LDL (LDLCALC) less than 100   BLOOD PRESSURE American Stroke Association blood pressure target is less that 120/80 mm/Hg  Your discharge blood pressure is:  BP: 114/74  Monitor your blood pressure  Limit your salt and alcohol intake  Many individuals will require more than one medication for high blood pressure  DIABETES (A1c is a blood sugar average for last 3 months) Goal HGBA1c is under 7% (HBGA1c is blood sugar average for last 3 months)  Diabetes: No known diagnosis of diabetes-    Lab Results  Component Value Date   HGBA1C 5.7 (H) 10/16/2019     Your HGBA1c can be lowered with medications, healthy diet, and exercise.  Check your blood sugar as directed by your physician  Call your physician if you experience unexplained or low blood sugars.  PHYSICAL ACTIVITY/REHABILITATION Goal is 30 minutes at least 4 days per week  Activity: No driving, Therapies: see above Return to work: N/A  Activity  decreases your risk of heart attack and stroke and makes your heart stronger.  It helps control your weight and blood pressure; helps you relax and can improve your mood.  Participate in a regular exercise program.  Talk with your doctor about the best form of exercise for you (dancing, walking, swimming, cycling).  DIET/WEIGHT Goal is to maintain a healthy weight  Your discharge diet is:  Diet Order            Diet regular Room service appropriate? Yes; Fluid consistency: Thin  Diet effective now              liquids Your height is:  Height: 4\' 11"  (149.9 cm) Your current weight is: Weight: 61.9 kg Your Body Mass Index (BMI) is:  BMI (Calculated): 27.55  Following the type of diet specifically designed for you will help prevent another stroke.  Your goal weight is:    Your goal Body Mass Index (BMI) is 19-24.  Healthy food habits can help reduce 3 risk factors for stroke:  High cholesterol, hypertension, and excess weight.  RESOURCES Stroke/Support Group:  Call [phone]   STROKE EDUCATION PROVIDED/REVIEWED AND GIVEN TO PATIENT Stroke warning signs and symptoms How to activate emergency medical system (call 911). Medications prescribed at discharge. Need for follow-up after discharge. Personal risk factors for stroke. Pneumonia vaccine given:  Flu vaccine given:  My questions have been answered, the writing is legible, and I understand these instructions.  I will adhere to these goals & educational materials that have been provided to me after my discharge from the hospital.     My questions have been answered and I understand these instructions. I will adhere to these goals and the provided educational materials after my discharge from the hospital.  Patient/Caregiver Signature _______________________________ Date __________  Clinician Signature _______________________________________ Date __________  Please bring this form and your medication list with you to all your follow-up doctor's appointments.

## 2019-11-03 NOTE — Progress Notes (Signed)
Pt discharged to home. Discharge teaching provided by Pam Love-PA. All pt belongings gone with pt. Pt escorted down by nursing staff by wheelchair. Pain 0/10. No further questions nor concerns. Larina Earthly, LPN

## 2019-11-03 NOTE — Progress Notes (Signed)
[facility] PHYSICAL MEDICINE & REHABILITATION PROGRESS NOTE  Online Kaya interpreter    Subjective/Complaints:    Objective:   No results found. Recent Labs    11/02/19 0634  WBC 4.4  HGB 12.5  HCT 37.8  PLT 217   No results for input(s): NA, K, CL, CO2, GLUCOSE, BUN, CREATININE, CALCIUM in the last 72 hours.  Intake/Output Summary (Last 24 hours) at 11/03/2019 0843 Last data filed at 11/03/2019 6644 Gross per 24 hour  Intake 240 ml  Output -  Net 240 ml     Physical Exam: Vital Signs Blood pressure 113/74, pulse 67, temperature 97.6 F (36.4 C), resp. rate 18, height 4\' 11"  (1.499 m), weight 61.9 kg, SpO2 100 %. Constitutional: No distress . Vital signs reviewed. HEENT: EOMI, oral membranes moist Neck: supple Cardiovascular: RRR without murmur. No JVD    Respiratory: CTA Bilaterally without wheezes or rales. Normal effort    GI: BS +, non-tender, non-distended  Skin: Warm and dry.  Intact. Psych: Normal mood.  Normal behavior. Musc: No edema in extremities.  No tenderness in extremities. Neurologic: Alert Global aphasia Motor: Limited due to participation, however LUE/LLE appear to be 5/5 proximal distal RUE: 4-/5 proximal distal- unchanged  RLE:  4+/5 proximal distal  Assessment/Plan: 1. 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   Stable for D/C today F/u PCP in 3-4 weeks F/u PM&R 2 weeks See D/C summary See D/C instructions Care Tool:  Bathing    Body parts bathed by patient: Chest, Abdomen, Front perineal area, Right upper leg, Left upper leg, Face, Buttocks, Left arm, Right lower leg, Left lower leg, Right arm   Body parts bathed by helper: Right arm     Bathing assist Assist Level: Supervision/Verbal cueing     Upper Body Dressing/Undressing Upper body dressing   What is the patient wearing?: Pull over shirt    Upper body assist Assist Level: Supervision/Verbal cueing    Lower Body Dressing/Undressing Lower body dressing       What is the patient wearing?: Underwear/pull up, Pants     Lower body assist Assist for lower body dressing: Supervision/Verbal cueing     Toileting Toileting    Toileting assist Assist for toileting: Supervision/Verbal cueing     Transfers Chair/bed transfer  Transfers assist     Chair/bed transfer assist level: Supervision/Verbal cueing Chair/bed transfer assistive device: Programmer, multimedia   Ambulation assist      Assist level: Supervision/Verbal cueing Assistive device: Walker-rolling Max distance: 150'   Walk 10 feet activity   Assist     Assist level: Supervision/Verbal cueing Assistive device: Walker-rolling   Walk 50 feet activity   Assist    Assist level: Supervision/Verbal cueing Assistive device: Walker-rolling    Walk 150 feet activity   Assist Walk 150 feet activity did not occur: Safety/medical concerns  Assist level: Supervision/Verbal cueing Assistive device: Walker-rolling    Walk 10 feet on uneven surface  activity   Assist Walk 10 feet on uneven surfaces activity did not occur: Safety/medical concerns   Assist level: Contact Guard/Touching assist     Wheelchair     Assist Will patient use wheelchair at discharge?: No      Wheelchair assist level: Moderate Assistance - Patient 50 - 74%      Wheelchair 50 feet with 2 turns activity    Assist            Wheelchair 150 feet activity     Assist  Blood pressure 113/74, pulse 67, temperature 97.6 F (36.4 C), resp. rate 18, height 4\' 11"  (1.499 m), weight 61.9 kg, SpO2 100 %.  Medical Problem List and Plan: 1.  Impaired mobility and ADLs secondary to left thalamic hemorrhage             Impairments include expressive aphasia, R sided hemiplegia worse in the upper extremity, R LE ataxia, impaired coordination, impaired motor planning,   Plan d/c today  2.  Antithrombotics: -DVT/anticoagulation:  Pharmaceutical: Lovenox-  may d/c             -antiplatelet therapy: N/A 3. Pain Management: N/A 4. Mood: LCSW to follow for evaluation and support as able              -antipsychotic agents: N/a 5. Neuropsych: This patient is not capable of making decisions on her own behalf. 6. Skin/Wound Care: Routine pressure relief measures.  7. Fluids/Electrolytes/Nutrition: Intake poor-- family to provide meals as able.  8. Hypertension: Patient will benefit from hypertension management education as she was not on any medication prior to this admission.   Continue Norvasc 5mg  QD. Goal SBP <140.   Vitals:   11/02/19 1956 11/03/19 0436  BP: 115/70 113/74  Pulse: 60 67  Resp: 18 18  Temp: 97.7 F (36.5 C) 97.6 F (36.4 C)  SpO2: 100% 100%  , well controlled 11/24.  9. Constipation: On Senna-Docusate BID.  MiraLAX  Improving 11.  Transaminitis  ALT, AST Stable   LOS: 13 days A FACE TO FACE EVALUATION WAS PERFORMED  11/05/19 11/03/2019, 8:43 AM

## 2019-11-03 NOTE — Discharge Summary (Signed)
Physician Discharge Summary  Patient ID: Lindsay Howard MRN: [id number] DOB/AGE: 1976/10/30 43 y.o.  Admit date: 10/21/2019 Discharge date: 11/03/2019  Discharge Diagnoses:  Principal Problem:   Thalamic hemorrhage ([place]) Active Problems:   Impaired mobility and activities of daily living   Transaminitis   Slow transit constipation   Essential hypertension   Discharged Condition: stable   Significant Diagnostic Studies: N/A   Labs:  Basic Metabolic Panel: BMP Latest Ref Rng & Units 10/26/2019 10/22/2019 10/21/2019  Glucose 70 - 99 mg/dL 102(H) 101(H) 94  BUN 6 - 20 mg/dL 13 12 9   Creatinine 0.44 - 1.00 mg/dL 0.64 0.69 0.64  BUN/Creat Ratio 8 - 20 - - -  Sodium 135 - 145 mmol/L 138 137 137  Potassium 3.5 - 5.1 mmol/L 3.7 3.5 3.4(L)  Chloride 98 - 111 mmol/L 102 105 105  CO2 22 - 32 mmol/L 28 22 22   Calcium 8.9 - 10.3 mg/dL 9.4 9.0 8.9    CBC: CBC Latest Ref Rng & Units 11/02/2019 10/26/2019 10/22/2019  WBC 4.0 - 10.5 K/uL 4.4 5.7 5.4  Hemoglobin 12.0 - 15.0 g/dL 12.5 12.7 12.4  Hematocrit 36.0 - 46.0 % 37.8 40.0 37.9  Platelets 150 - 400 K/uL 217 265 242    CBG: No results for input(s): GLUCAP in the last 168 hours.  Brief HPI:   Lindsay Howard is a 43 y.o. non english speaking female from [place] with history of HTN--no medications otherwise in good health, who was admitted on 10/15/19 with found by family after her nap with inability to communicate and moaning incoherently. Patient speaks [name] also known as Occupational hygienist. UDS negative. CTA head/neck showed 2.7 cm IPH centered at left thalamus with mild localized edema and BP 154/90 therefore started on cleveprex drip for better control. 2D echo showed EF 60-65% with no wall abnormality. Bleed felt to be hypertensive in nature and Norvasc added for better control. Patient with resultant right sided weakness, right inattention, left lean and question of cognitive deficits. CIR recommended due to functional decline.    Hospital Course: Samary  Keady was admitted to rehab 10/21/2019 for inpatient therapies to consist of PT, ST and OT at least three hours five days a week. Past admission physiatrist, therapy team and rehab RN have worked together to provide customized collaborative inpatient rehab. Her blood pressures were monitored on TID basis and has been controlled on Norvasc 10 mg daily. She did report sensory deficits in BLE--worse in LLE and was started on gabapentin to help with symptoms. Follow up labs showed that mild hypokalemia as well as anemia has resolved. LFTs noted to be mildly elevated and recommend repeat labs in 2-3 weeks for follow up after discharge. Bowel program has been augmented to help with constipation. Her po intake has improved and she is continent of bowel and bladder. She has made gains during rehab stay and is currently at supervision level.  She will continue to receive follow up  HHPT, [place] and HHST by [facility]  after discharge   Rehab course: During patient's stay in rehab weekly team conferences were held to monitor patient's progress, set goals and discuss barriers to discharge. At admission, patient required mod assist with mobility and with basic self care tasks. She exhibited severe cognitive deficits with decreased in speech intelligibility and left oral motor weakness. She  has had improvement in activity tolerance, balance, postural control as well as ability to compensate for deficits. She has had improvement in functional use RUE  and  RLE as well as improvement in awareness. She is able to complete ADL tasks with supervision. She require supervision for transfers and to ambulate 200' with RW and cues/gestures for safety. She requires CGA to climb 12 stairs. She requires min to mod multimodal cues to complete functional and familiar tasks Speech is 100% intelligible at conversation level. Family education was completed regarding all aspects of communication and safety.    Disposition: Home  Diet:  Heart healthy.  Special Instructions: 1. Family to assist with medication management.   Discharge Instructions    Ambulatory referral to Physical Medicine Rehab   Complete by: As directed    1-2 weeks transitional care appt     Allergies as of 11/03/2019   No Known Allergies     Medication List    STOP taking these medications    stroke: mapping our early stages of recovery book Misc   feeding supplement (ENSURE ENLIVE) Liqd     TAKE these medications   acetaminophen 325 MG tablet Commonly known as: TYLENOL Take 1-2 tablets (325-650 mg total) by mouth every 4 (four) hours as needed for mild pain.   amLODipine 10 MG tablet Commonly known as: NORVASC Take 1 tablet (10 mg total) by mouth daily.   gabapentin 100 MG capsule Commonly known as: NEURONTIN Take 1 capsule (100 mg total) by mouth 3 (three) times daily.   lip balm ointment Apply topically as needed for lip care.   pantoprazole 40 MG tablet Commonly known as: PROTONIX Take 1 tablet (40 mg total) by mouth at bedtime.   senna-docusate 8.6-50 MG tablet Commonly known as: Senokot-S Take 2 tablets by mouth at bedtime. What changed:   how much to take  when to take this Notes to patient: For constipation. Can increase or decrease it depending on how your bowels are working.       Follow-up Information    Kirsteins, Victorino Sparrow, MD Follow up.   Specialty: Physical Medicine and Rehabilitation Why: office will call you with follow up appointment Contact information: [address] [place] [address] [phone]        [facility]. Call on 11/04/2019.   Why: For stroke follow up Contact information: [address]     [address] [place] [address] [phone]       Anders Simmonds, PA-C Follow up on 11/11/2019.   Specialty: Family Medicine Why: Appointment @ 2:50 PM Contact information: [address] [place]  [address] [phone]           Signed: Jacquelynn Cree 11/03/2019, 9:23 AM

## 2019-11-09 ENCOUNTER — Telehealth: Payer: Self-pay | Admitting: Registered Nurse

## 2019-11-09 NOTE — Telephone Encounter (Signed)
Transitional Care call Transitional Questions answered by Ms. Charlynn Grimes Niece.  Patient name: Lindsay Howard,Somers  DOB: [birth year] 1. Are you/is patient experiencing any problems since coming home? No a. Are there any questions regarding any aspect of care? No 2. Are there any questions regarding medications administration/dosing? No a. Are meds being taken as prescribed? Yes b. "Patient should review meds with caller to confirm" Medication List Reviewed. 3. Have there been any falls? No 4. Has Home Health been to the house and/or have they contacted you? No, [facility] a. If not, have you tried to contact them? No b. Can we help you contact them? Yes, this provider called [facility], they don't have Ms. Troyer listed for a referral. Sent an email to [place] for clarifiction, awaiting on her response.  5. Are bowels and bladder emptying properly?Yes a. Are there any unexpected incontinence issues? No b. If applicable, is patient following bowel/bladder programs? NA 6. Any fevers, problems with breathing, unexpected pain? No 7. Are there any skin problems or new areas of breakdown? No 8. Has the patient/family member arranged specialty MD follow up (ie cardiology/neurology/renal/surgical/etc.)?  HFU appointments are scheduled. a. Can we help arrange? NA 9. Does the patient need any other services or support that we can help arrange? No 10. Are caregivers following through as expected in assisting the patient? Yes 11. Has the patient quit smoking, drinking alcohol, or using drugs as recommended? Ms. Charlynn Grimes states Ms. Tomasello doesn't smoke, drink alcohol or use illicit drugs.   Appointment date/time 11/13/2019 with Dr. Ranell Patrick, arrival time 10:20 for 10:40 appointment. At [facility]

## 2019-11-10 NOTE — Progress Notes (Signed)
Patient ID: Lindsay Howard, female   DOB: 09/24/1976, 43 y.o.   MRN: [id number]     Lindsay Howard, is a 43 y.o. female  HYQ:[id number]  [id number]:[id number]  DOB - 10/12/1976  Subjective:  Chief Complaint and HPI: Lindsay Howard is a 43 y.o. female here today to establish care and for a follow up visit After hospitalization 11/05-11/10/2019 for CVA. Subsequent rehabilitation 11/11-11/24/2020.  She has had untreated htn for a while.  The family refused interpreters today.  The son is translating.  The patient is now able to perform all ADL without much assistance.  She is aware of all of her f/up appts.  Denies having any current needs for additional help at home.  No fever.  No N/V.  Appetite is good.  Eating and drinking well.  Weight stable today at 133(136 about 1 month ago)   From discharge summary: Brief/Interim Summary: Lindsay Howard a 43 year old female with history of hypertension not on medication but otherwise in good health until October 15, 2019 when she was admitted with inability to communicate and only moaning incoherently she was found to have a stroke by MRI of the brain that showed stable left thalamic hemorrhage with mild progression of rim of edema. She has been started on antihypertensive with Norvasc she was initially on NG tube with n.p.o. but now she is able to feed.  11/11: No acute events ON. Denies complaints this AM. Interpretation through son at bedside.  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     [facility]     - MRI Brain 11/6 and repeat CTH: stable     - TTE: EF 65%     - A1c 5.7     - LDL 123: statin     - PT/OT rec  HTN     - goal SBP < 140     - amlodipine 5mg  qday  Dysphagia     - per SLP:          - Recommendations                    - Diet recommendations: Regular;Thin liquid             - Liquids provided via: Straw;Cup             -  Medication Administration: Whole meds with puree             - Supervision: Staff to assist with self feeding             - Compensations: Slow rate;Small sips/bites;Follow solids with liquid as needed to reduce oral residue.             - Postural Changes and/or Swallow Maneuvers: Seated upright 90 degrees  Hypokalemia     - add PO K+  Microcytosis     - follow up iron level outpt  From rehab notes: Brief HPI:   Lindsay Howard is a 43 y.o. non english speaking female from [place] with history of HTN--no medications otherwise in good health, who was admitted on 10/15/19 with found by family after her nap with inability to communicate and moaning incoherently. Patient speaks Maryagnes AmosKarenni also known as Financial risk analystKayah. UDS negative. CTA head/neck showed 2.7 cm IPH centered at left thalamus with mild localized edema and BP 154/90 therefore started on cleveprex drip for better control. 2D echo  showed EF 60-65% with no wall abnormality. Bleed felt to be hypertensive in nature and Norvasc added for better control. Patient with resultant right sided weakness, right inattention, left lean and question of cognitive deficits. CIR recommended due to functional decline.    Hospital Course: Lindsay Howard was admitted to rehab 10/21/2019 for inpatient therapies to consist of PT, ST and OT at least three hours five days a week. Past admission physiatrist, therapy team and rehab RN have worked together to provide customized collaborative inpatient rehab. Her blood pressures were monitored on TID basis and has been controlled on Norvasc 10 mg daily. She did report sensory deficits in BLE--worse in LLE and was started on gabapentin to help with symptoms. Follow up labs showed that mild hypokalemia as well as anemia has resolved. LFTs noted to be mildly elevated and recommend repeat labs in 2-3 weeks for follow up after discharge. Bowel program has been augmented to help with constipation. Her po intake has improved and she is continent of bowel  and bladder. She has made gains during rehab stay and is currently at supervision level.  She will continue to receive follow up  HHPT, HHOT and [facility]ST by [facility]  after discharge   Rehab course: During patient's stay in rehab weekly team conferences were held to monitor patient's progress, set goals and discuss barriers to discharge. At admission, patient required mod assist with mobility and with basic self care tasks. She exhibited severe cognitive deficits with decreased in speech intelligibility and left oral motor weakness. She  has had improvement in activity tolerance, balance, postural control as well as ability to compensate for deficits. She has had improvement in functional use RUE  and RLE as well as improvement in awareness. She is able to complete ADL tasks with supervision. She require supervision for transfers and to ambulate 200' with RW and cues/gestures for safety. She requires CGA to climb 12 stairs. She requires min to mod multimodal cues to complete functional and familiar tasks Speech is 100% intelligible at conversation level. Family education was completed regarding all aspects of communication   ED/Hospital notes reviewed.   Social History: Family history:  ROS:   Constitutional:  No f/c, No night sweats, No unexplained weight loss. EENT:  No vision changes, No blurry vision, No hearing changes. No mouth, throat, or ear problems.  Respiratory: No cough, No SOB Cardiac: No CP, no palpitations GI:  No abd pain, No N/V/D. GU: No Urinary s/sx Musculoskeletal: No joint pain Neuro: No headache, no dizziness.  Skin: No rash Endocrine:  No polydipsia. No polyuria.  Psych: Denies SI/HI  No problems updated.  ALLERGIES: No Known Allergies  PAST MEDICAL HISTORY: Past Medical History:  Diagnosis Date   Bulky or enlarged uterus     MEDICATIONS AT HOME: Prior to Admission medications   Medication Sig Start Date End Date Taking? Authorizing Provider    acetaminophen (TYLENOL) 325 MG tablet Take 1-2 tablets (325-650 mg total) by mouth every 4 (four) hours as needed for mild pain. 10/30/19  Yes Love, Evlyn Kanner, PA-C  amLODipine (NORVASC) 10 MG tablet Take 1 tablet (10 mg total) by mouth daily. 11/11/19  Yes Georgian Co M, PA-C  gabapentin (NEURONTIN) 100 MG capsule Take 1 capsule (100 mg total) by mouth 3 (three) times daily. 11/11/19  Yes Georgian Co M, PA-C  pantoprazole (PROTONIX) 40 MG tablet Take 1 tablet (40 mg total) by mouth at bedtime. 11/11/19  Yes Shireen Rayburn, Marzella Schlein, PA-C  senna-docusate (SENOKOT-S)  8.6-50 MG tablet Take 2 tablets by mouth at bedtime. 11/03/19  Yes Love, Ivan Anchors, PA-C  lip balm (CARMEX) ointment Apply topically as needed for lip care. Patient not taking: Reported on 11/11/2019 11/03/19   Bary Leriche, PA-C     Objective:  EXAM:   Vitals:   11/11/19 1432  BP: 120/78  Pulse: 72  Temp: 98.1 F (36.7 C)  TempSrc: Oral  SpO2: 96%  Weight: 133 lb 12.8 oz (60.7 kg)  Height: 4\' 5"  (1.346 m)    General appearance : A&OX3. NAD. Non-toxic-appearing HEENT: Atraumatic and Normocephalic.  PERRLA. EOM intact.  Chest/Lungs:  Breathing-non-labored, Good air entry bilaterally, breath sounds normal without rales, rhonchi, or wheezing  CVS: S1 S2 regular, no murmurs, gallops, rubs  Extremities: Bilateral Lower Ext shows no edema, both legs are warm to touch with = pulse throughout.  No facial drooping or asymmetry.  All extremities =B Neurology:  CN II-XII grossly intact, Non focal.   Psych:  TP linear. J/I WNL. Normal speech. Appropriate eye contact and affect.  Skin:  No Rash  Data Review Lab Results  Component Value Date   HGBA1C 5.7 (H) 10/16/2019   HGBA1C 5.7 (H) 02/15/2016     Assessment & Plan   1. Transaminitis - Comprehensive metabolic panel  2. Left-sided nontraumatic intracerebral hemorrhage of brainstem [facility]) Neurology to follow-keep all appts  3. Hypertension, unspecified  type Controlled-continue - amLODipine (NORVASC) 10 MG tablet; Take 1 tablet (10 mg total) by mouth daily.  Dispense: 30 tablet; Refill: 3 - CBC with Differential - Comprehensive metabolic panel  4. Gastroesophageal reflux disease with esophagitis, unspecified whether hemorrhage - pantoprazole (PROTONIX) 40 MG tablet; Take 1 tablet (40 mg total) by mouth at bedtime.  Dispense: 30 tablet; Refill: 3  5. Paresthesias - gabapentin (NEURONTIN) 100 MG capsule; Take 1 capsule (100 mg total) by mouth 3 (three) times daily.  Dispense: 90 capsule; Refill: 3  6. Hospital discharge follow-up  7. Language barrier Son interpreted and additional time performing visit was required.      Patient have been counseled extensively about nutrition and exercise  Return in about 1 month (around 12/12/2019) for assign PCP; f/up htn/recent stroke.  The patient was given clear instructions to go to ER or return to medical center if symptoms don't improve, worsen or new problems develop. The patient verbalized understanding. The patient was told to call to get lab results if they haven't heard anything in the next week.     Freeman Caldron, PA-C [facility] [place], [place]   11/11/2019, 2:54 PM

## 2019-11-11 ENCOUNTER — Ambulatory Visit: Payer: BLUE CROSS/BLUE SHIELD | Attending: Family Medicine | Admitting: Physician Assistant

## 2019-11-11 ENCOUNTER — Other Ambulatory Visit: Payer: Self-pay

## 2019-11-11 VITALS — BP 120/78 | HR 72 | Temp 98.1°F | Ht <= 58 in | Wt 133.8 lb

## 2019-11-11 DIAGNOSIS — R7401 Elevation of levels of liver transaminase levels: Secondary | ICD-10-CM

## 2019-11-11 DIAGNOSIS — I613 Nontraumatic intracerebral hemorrhage in brain stem: Secondary | ICD-10-CM

## 2019-11-11 DIAGNOSIS — I10 Essential (primary) hypertension: Secondary | ICD-10-CM

## 2019-11-11 DIAGNOSIS — Z09 Encounter for follow-up examination after completed treatment for conditions other than malignant neoplasm: Secondary | ICD-10-CM

## 2019-11-11 DIAGNOSIS — R202 Paresthesia of skin: Secondary | ICD-10-CM

## 2019-11-11 DIAGNOSIS — K2100 Gastro-esophageal reflux disease with esophagitis, without bleeding: Secondary | ICD-10-CM

## 2019-11-11 DIAGNOSIS — Z789 Other specified health status: Secondary | ICD-10-CM

## 2019-11-11 MED ORDER — GABAPENTIN 100 MG PO CAPS: 100.0000 mg | ORAL_CAPSULE | Freq: Three times a day (TID) | ORAL | 3 refills | Status: DC

## 2019-11-11 MED ORDER — AMLODIPINE BESYLATE 10 MG PO TABS: 10.0000 mg | ORAL_TABLET | Freq: Every day | ORAL | 3 refills | Status: DC

## 2019-11-11 MED ORDER — PANTOPRAZOLE SODIUM 40 MG PO TBEC: 40.0000 mg | DELAYED_RELEASE_TABLET | Freq: Every day | ORAL | 3 refills | Status: DC

## 2019-11-11 NOTE — Patient Instructions (Signed)
Keep all doctor's appts!

## 2019-11-12 LAB — COMPREHENSIVE METABOLIC PANEL
ALT: 104 IU/L — ABNORMAL HIGH (ref 0–32)
AST: 94 IU/L — ABNORMAL HIGH (ref 0–40)
Albumin/Globulin Ratio: 0.9 — ABNORMAL LOW (ref 1.2–2.2)
Albumin: 4 g/dL (ref 3.8–4.8)
Alkaline Phosphatase: 133 IU/L — ABNORMAL HIGH (ref 39–117)
BUN/Creatinine Ratio: 10 (ref 9–23)
BUN: 7 mg/dL (ref 6–24)
Bilirubin Total: 0.4 mg/dL (ref 0.0–1.2)
CO2: 24 mmol/L (ref 20–29)
Calcium: 8.7 mg/dL (ref 8.7–10.2)
Chloride: 103 mmol/L (ref 96–106)
Creatinine, Ser: 0.7 mg/dL (ref 0.57–1.00)
GFR calc Af Amer: 124 mL/min/{1.73_m2} (ref >59)
GFR calc non Af Amer: 108 mL/min/{1.73_m2} (ref >59)
Globulin, Total: 4.3 g/dL (ref 1.5–4.5)
Glucose: 95 mg/dL (ref 65–99)
Potassium: 3.5 mmol/L (ref 3.5–5.2)
Sodium: 140 mmol/L (ref 134–144)
Total Protein: 8.3 g/dL (ref 6.0–8.5)

## 2019-11-12 LAB — CBC WITH DIFFERENTIAL/PLATELET
Basophils Absolute: 0 10*3/uL (ref 0.0–0.2)
Basos: 1 %
EOS (ABSOLUTE): 0 10*3/uL (ref 0.0–0.4)
Eos: 1 %
Hematocrit: 36.1 % (ref 34.0–46.6)
Hemoglobin: 11.8 g/dL (ref 11.1–15.9)
Immature Grans (Abs): 0 10*3/uL (ref 0.0–0.1)
Immature Granulocytes: 0 %
Lymphocytes Absolute: 1.6 10*3/uL (ref 0.7–3.1)
Lymphs: 42 %
MCH: 25.2 pg — ABNORMAL LOW (ref 26.6–33.0)
MCHC: 32.7 g/dL (ref 31.5–35.7)
MCV: 77 fL — ABNORMAL LOW (ref 79–97)
Monocytes Absolute: 0.5 10*3/uL (ref 0.1–0.9)
Monocytes: 13 %
Neutrophils Absolute: 1.6 10*3/uL (ref 1.4–7.0)
Neutrophils: 43 %
Platelets: 209 10*3/uL (ref 150–450)
RBC: 4.68 x10E6/uL (ref 3.77–5.28)
RDW: 15.4 % (ref 11.7–15.4)
WBC: 3.8 10*3/uL (ref 3.4–10.8)

## 2019-11-13 ENCOUNTER — Encounter: Payer: Self-pay | Admitting: Physical Medicine and Rehabilitation

## 2019-11-13 ENCOUNTER — Other Ambulatory Visit: Payer: Self-pay

## 2019-11-13 ENCOUNTER — Encounter
Payer: BLUE CROSS/BLUE SHIELD | Attending: Physical Medicine and Rehabilitation | Admitting: Physical Medicine and Rehabilitation

## 2019-11-13 VITALS — BP 112/74 | HR 75 | Temp 98.0°F | Ht <= 58 in | Wt 134.2 lb

## 2019-11-13 DIAGNOSIS — I610 Nontraumatic intracerebral hemorrhage in hemisphere, subcortical: Secondary | ICD-10-CM | POA: Diagnosis not present

## 2019-11-13 DIAGNOSIS — E669 Obesity, unspecified: Secondary | ICD-10-CM | POA: Insufficient documentation

## 2019-11-13 DIAGNOSIS — Z6833 Body mass index (BMI) 33.0-33.9, adult: Secondary | ICD-10-CM

## 2019-11-13 DIAGNOSIS — I10 Essential (primary) hypertension: Secondary | ICD-10-CM | POA: Insufficient documentation

## 2019-11-13 MED ORDER — MIRTAZAPINE 15 MG PO TABS: 15.0000 mg | ORAL_TABLET | Freq: Every day | ORAL | 0 refills | Status: DC

## 2019-11-13 NOTE — Progress Notes (Signed)
Subjective:    Patient ID: Lindsay Howard, female    DOB: 1976-03-28, 43 y.o.   MRN: [id number]  HPI  Mrs. Lindsay Howard is a 43 year old woman presenting for hospital follow-up following a thalamic hemorrhage for which she was admitted to CIR.  Her son and she report that she has been doing well since discharge. She has been receiving home therapy and ambulating without a device both in the home and outside.   She does report depressed mood and her son reports decreased appetite. She does not have much interest in doing things except for walking around the home, but son says this was true prior to stroke as well.   Denies constipation. Otherwise had no complaints and does not require medication refills. Her son says that both he and his father help care for [name] at home.     Pain Inventory Average Pain 0 Pain Right Now 0 My pain is no pain  In the last 24 hours, has pain interfered with the following? General activity 0 Relation with others 0 Enjoyment of life 0 What TIME of day is your pain at its worst? no pain Sleep (in general) NA  Pain is worse with: no pain Pain improves with: no pain Relief from Meds: no pain  Mobility walk with assistance ability to climb steps?  yes do you drive?  no  Function not employed: date last employed . I need assistance with the following:  dressing, bathing and household duties  Neuro/Psych trouble walking  Prior Studies Any changes since last visit?  no  Physicians involved in your care Any changes since last visit?  no   Family History  Problem Relation Age of Onset  . Epilepsy Sister    Social History   Socioeconomic History  . Marital status: Married    Spouse name: Elpidio Eric  . Number of children: 2  . Years of education: 0  . Highest education level: Not on file  Occupational History  . Occupation: Housewife  Social Needs  . Financial resource strain: Not on file  . Food insecurity    Worry: Not on file    Inability: Not  on file  . Transportation needs    Medical: Not on file    Non-medical: Not on file  Tobacco Use  . Smoking status: Never Smoker  . Smokeless tobacco: Never Used  Substance and Sexual Activity  . Alcohol use: Not Currently  . Drug use: Never  . Sexual activity: Yes    Birth control/protection: Surgical  Lifestyle  . Physical activity    Days per week: Not on file    Minutes per session: Not on file  . Stress: Not on file  Relationships  . Social Musician on phone: Not on file    Gets together: Not on file    Attends religious service: Not on file    Active member of club or organization: Not on file    Attends meetings of clubs or organizations: Not on file    Relationship status: Not on file  Other Topics Concern  . Not on file  Social History Narrative   ** Merged History Encounter **       Originally from [place] for 12 years. [place] at home with husband, 2 sons, and her sister.   Past Surgical History:  Procedure Laterality Date  . CESAREAN SECTION  2009, 2004  .  CESAREAN SECTION     Past Medical History:  Diagnosis Date  . Bulky or enlarged uterus    BP 112/74   Pulse 75   Temp 98 F (36.7 C)   Ht 4\' 5"  (1.346 m)   Wt 134 lb 3.2 oz (60.9 kg)   LMP  (LMP Unknown)   SpO2 97%   BMI 33.59 kg/m   Opioid Risk Score:   Fall Risk Score:  `1  Depression screen PHQ 2/9  Depression screen [facility] 2/9 11/13/2019 02/15/2016  Decreased Interest 1 0  Down, Depressed, Hopeless 1 1  PHQ - 2 Score 2 1  Altered sleeping 1 0  Tired, decreased energy 1 0  Change in appetite 2 0  Feeling bad or failure about yourself  1 0  Trouble concentrating 1 0  Moving slowly or fidgety/restless 2 0  Suicidal thoughts 1 0  PHQ-9 Score 11 1  Difficult doing work/chores - Not difficult at all   Review of Systems  Constitutional: Negative.   HENT: Negative.   Eyes: Negative.   Respiratory: Negative.   Cardiovascular:  Negative.   Gastrointestinal: Negative.   Endocrine: Negative.   Genitourinary: Negative.   Musculoskeletal: Positive for gait problem.  Skin: Negative.   Allergic/Immunologic: Negative.   Hematological: Negative.   Psychiatric/Behavioral: Positive for dysphoric mood.  All other systems reviewed and are negative.      Objective:   Physical Exam  Constitutional: No distress . Vital signs reviewed. HEENT: EOMI, oral membranes moist Neck: supple Cardiovascular: RRR without murmur. No JVD    Respiratory: CTA Bilaterally without wheezes or rales. Normal effort    GI: BS +, non-tender, non-distended  Skin: Warm and dry.  Intact. Psych: Depressed mood, flat affect Musc: No edema in extremities.  No tenderness in extremities. Neurologic: Alert, follows commands Motor:   LUE/LLE appear to be 5/5 proximal distal RUE: 4/5 proximal distal- unchanged  RLE:  5/5 proximal distal Able to ambulate in room without device.       Assessment & Plan:  Lindsay Howard is a 43 year old woman presenting for hospital follow-up following a thalamic hemorrhage for which she was admitted to CIR.  Impaired gait and mobility: -Continue home PT and OT to improve mobility and maximize independence.  Depressed mood and poor appetite: -I will prescribe Mirtazepine 15mg  for both mood and to stimulate appetite. Advised Mrs. Schechter that she can take this at night.  HTN: -BP is 112/74. It was also low in hospital. Will monitor at next visit and if it is still low, will discontinue amlodipine.  Polypharmacy: -Can discontinue gabapentin as patient is not experiencing pain.  -Can discontinue senna-docusate since she is having regular BM and moving well.  Thirty minutes of face to face patient care time were spent during this visit. All questions were encouraged and answered. Follow up with me in 4 weeks.

## 2019-11-26 ENCOUNTER — Encounter: Payer: Self-pay | Admitting: *Deleted

## 2019-12-09 ENCOUNTER — Telehealth: Payer: Self-pay | Admitting: *Deleted

## 2019-12-09 ENCOUNTER — Other Ambulatory Visit: Payer: Self-pay

## 2019-12-09 ENCOUNTER — Ambulatory Visit (INDEPENDENT_AMBULATORY_CARE_PROVIDER_SITE_OTHER): Payer: MEDICAID | Admitting: Adult Health

## 2019-12-09 ENCOUNTER — Encounter: Payer: Self-pay | Admitting: Adult Health

## 2019-12-09 VITALS — BP 111/73 | HR 66 | Temp 98.2°F | Ht <= 58 in | Wt 132.8 lb

## 2019-12-09 DIAGNOSIS — I69122 Dysarthria following nontraumatic intracerebral hemorrhage: Secondary | ICD-10-CM

## 2019-12-09 DIAGNOSIS — I610 Nontraumatic intracerebral hemorrhage in hemisphere, subcortical: Secondary | ICD-10-CM

## 2019-12-09 DIAGNOSIS — I10 Essential (primary) hypertension: Secondary | ICD-10-CM

## 2019-12-09 DIAGNOSIS — Z789 Other specified health status: Secondary | ICD-10-CM

## 2019-12-09 DIAGNOSIS — Z7409 Other reduced mobility: Secondary | ICD-10-CM

## 2019-12-09 DIAGNOSIS — G8191 Hemiplegia, unspecified affecting right dominant side: Secondary | ICD-10-CM

## 2019-12-09 NOTE — Progress Notes (Signed)
[facility] [address] [place] [address] [phone]       HOSPITAL FOLLOW UP NOTE  [name] Date of Birth:  [birth year] Medical Record Number:  [id number]   Reason for Referral:  hospital stroke follow up    CHIEF COMPLAINT:  Chief Complaint  Patient presents with  . Hospitalization Follow-up    Niece present. Treatment room. No new concerns at this time.     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  History obtained from patient, niece and chart review. Reviewed all radiology images and labs personally.  [name] is a 43 y.o. female from [place] with history of HTN  presented to [facility] ED on 10/15/2019 after being found not verbally responsive.  Stroke work-up showed left thalamic hemorrhage as evidenced on CT head and MRI likely hypertensive in etiology.  CTA head/neck unremarkable.  Repeat CT head stable.  2D echo normal EF.  She is not on antithrombotics PTA and was not recommended to initiate due to hemorrhage.  History of HTN with high SBP 154/90 and initiated amlodipine 5 mg daily with long-term BP goal normotensive range.  LDL 123.  A1c 5.7.  Other stroke risk factors include smokeless tobacco user but no prior history of stroke.  Residual deficits include dysphagia, right facial droop, and right hemiparesis and was discharged to CIR for ongoing therapy on 10/21/2019 and discharged home with recommendation of home health therapy on 11/03/2019.  [name] is a 43 year old female who is being seen today for hospital follow-up accompanied by her niece who assists with interpreting.  Residual deficits of right hemiparesis, RUE numbness and dysarthria but overall improving.  She denies being seen by home health therapies but is interested in pursuing.  She is able to perform majority of ADLs independently but has not been able to return back to cooking or cleaning.   She does not drive.  She is able to ambulate without assistive device and denies recent falls.  She continues with in her own home with her husband and son.  Blood pressure today 111/73.  No further concerns at this time.     ROS:   14 system review of systems performed and negative with exception of slurred speech, pain, weakness, gait impairment  PMH:  Past Medical History:  Diagnosis Date  . Bulky or enlarged uterus     PSH:  Past Surgical History:  Procedure Laterality Date  . CESAREAN SECTION  2009, 2004  . CESAREAN SECTION      Social History:  Social History   Socioeconomic History  . Marital status: Married    Spouse name: Deneise Lever  . Number of children: 2  . Years of education: 0  . Highest education level: Not on file  Occupational History  . Occupation: Housewife  Tobacco Use  . Smoking status: Never Smoker  . Smokeless tobacco: Never Used  Substance and Sexual Activity  . Alcohol use: Not Currently  . Drug use: Never  . Sexual activity: Yes    Birth control/protection: Surgical  Other Topics Concern  . Not on file  Social History Narrative   ** Merged History Encounter **       Originally from [place] In refugee camp in [place] for 12 years. [place] at home with husband, 2 sons, and her sister.   Social Determinants of Health   Financial Resource Strain:   .  Difficulty of Paying Living Expenses: Not on file  Food Insecurity:   . Worried About Programme researcher, broadcasting/film/video in the Last Year: Not on file  . Ran Out of Food in the Last Year: Not on file  Transportation Needs:   . Lack of Transportation (Medical): Not on file  . Lack of Transportation (Non-Medical): Not on file  Physical Activity:   . Days of Exercise per Week: Not on file  . Minutes of Exercise per Session: Not on file  Stress:   . Feeling of Stress : Not on file  Social Connections:   . Frequency of Communication with Friends and Family: Not on file  . Frequency of  Social Gatherings with Friends and Family: Not on file  . Attends Religious Services: Not on file  . Active Member of Clubs or Organizations: Not on file  . Attends Banker Meetings: Not on file  . Marital Status: Not on file  Intimate Partner Violence:   . Fear of Current or Ex-Partner: Not on file  . Emotionally Abused: Not on file  . Physically Abused: Not on file  . Sexually Abused: Not on file    Family History:  Family History  Problem Relation Age of Onset  . Epilepsy Sister     Medications:   Current Outpatient Medications on File Prior to Visit  Medication Sig Dispense Refill  . acetaminophen (TYLENOL) 325 MG tablet Take 1-2 tablets (325-650 mg total) by mouth every 4 (four) hours as needed for mild pain.    Marland Kitchen amLODipine (NORVASC) 10 MG tablet Take 1 tablet (10 mg total) by mouth daily. 30 tablet 3  . lip balm (CARMEX) ointment Apply topically as needed for lip care. 7 g 0  . mirtazapine (REMERON) 15 MG tablet Take 1 tablet (15 mg total) by mouth at bedtime. 30 tablet 0  . pantoprazole (PROTONIX) 40 MG tablet Take 1 tablet (40 mg total) by mouth at bedtime. 30 tablet 3   No current facility-administered medications on file prior to visit.    Allergies:  No Known Allergies   Physical Exam  Vitals:   12/09/19 0856  BP: 111/73  Pulse: 66  Temp: 98.2 F (36.8 C)  TempSrc: Oral  Weight: 132 lb 12.8 oz (60.2 kg)  Height:  (1.346 m)   Body mass index is 33.24 kg/m. No exam data present  Depression screen [facility] 2/9 12/09/2019  Decreased Interest 0  Down, Depressed, Hopeless 0  PHQ - 2 Score 0  Altered sleeping -  Tired, decreased energy -  Change in appetite -  Feeling bad or failure about yourself  -  Trouble concentrating -  Moving slowly or fidgety/restless -  Suicidal thoughts -  PHQ-9 Score -  Difficult doing work/chores -     General: well developed, well nourished, pleasant middle-age Asian female, seated, in no evident  distress Head: head normocephalic and atraumatic.   Neck: supple with no carotid or supraclavicular bruits Cardiovascular: regular rate and rhythm, no murmurs Musculoskeletal: no deformity Skin:  no rash/petichiae Vascular:  Normal pulses all extremities   Neurologic Exam Mental Status: Awake and fully alert.  Unable to assess language or speech due to language barrier but niece does endorse mild slurring and slow speech. Oriented to place and time per niece. Recent and remote memory intact and attention span, concentration and fund of knowledge appropriate per neice. Mood and affect appropriate.  Cranial Nerves: Fundoscopic exam reveals sharp disc margins. Pupils equal, briskly reactive  to light. Extraocular movements full without nystagmus. Visual fields full to confrontation. Hearing intact. Facial sensation intact.  Mild right lower facial weakness Motor: Normal bulk and tone.  RUE: 4+/5 with positive pronator drift RLE: 4/5 Full strength left upper and lower extremity Sensory.: intact to touch , pinprick , position and vibratory sensation.  Coordination: Rapid alternating movements normal in all extremities except mildly decreased right hand. Finger-to-nose and heel-to-shin performed accurately bilaterally.  Orbits left arm over right arm Gait and Station: Arises from chair without difficulty. Stance is normal. Gait demonstrates  hemiplegic gait with mild imbalance without assistive device Reflexes: 1+ and symmetric. Toes downgoing.     NIHSS  3 Modified Rankin  3    Diagnostic Data (Labs, Imaging, Testing)  Ct Angio Head W Or Wo Contrast Ct Angio Neck W And/or Wo Contrast 10/15/2019   CT HEAD  IMPRESSION:  2.7 cc acute intraparenchymal hemorrhage centered at the left thalamus. Mild localized edema without significant regional mass effect. No midline shift or intraventricular extension.   CTA HEAD AND NECK  IMPRESSION:  1. Normal CTA of the head and neck. No large vessel  occlusion or hemodynamically significant stenosis.  2. No vascular abnormality seen underlying the left thalamic hemorrhage.   Dg Abdomen 1 View 10/16/2019 IMPRESSION:  Nasogastric tube tip and side hole in the proximal stomach.  Dg Abdomen 1 View 10/16/2019 IMPRESSION:  No acute abnormality. The previously seen nasogastric tube is no longer visualized.   Ct Head Wo Contrast 10/16/2019 IMPRESSION:  1. No significant interval change in size of left thalamic intraparenchymal hemorrhage, with mildly increased surrounding vasogenic edema with trace localized left-to-right midline shift. No hydrocephalus or ventricular trapping. No intraventricular extension of hemorrhage.  2. No other new acute intracranial abnormality.   Mr Brain Wo Contrast 10/16/2019   ADDENDUM: Omitted impression #4 of generally low marrow signal, please correlate with CBC.  IMPRESSION:  1. Left thalamic hematoma with size stability since earlier CTA. The rim of edema has progressed.  2. No evidence of underlying mass or primary infarct.  3. Minimal white matter disease.   Dg Chest Portable 1 View 10/15/2019 IMPRESSION:  1. No evidence of active cardiopulmonary disease.  2. UPPER limits normal heart size.   Dg Abd Portable 1v 10/16/2019 IMPRESSION:  NG tube tip projects over the gastric body     ASSESSMENT: Lindsay Howard is a 43 y.o. year old female presented after being found not verbally responsive on 10/15/2019 with stroke work-up revealing left thalamic hemorrhage secondary to hypertensive etiology. Vascular risk factors include HTN and smokeless tobacco user.  Residual deficits of mild right hemiparesis and dysarthria    PLAN:  1. Left thalamic hemorrhage: Recommend initiating home health PT/OT/ST for ongoing residual deficits and referral was placed.  Advised to avoid aspirin, aspirin-containing products or ibuprofen products due to recent hemorrhage.  Maintain strict control of hypertension with blood  pressure goal below 130/90, diabetes with hemoglobin A1c goal below 6.5% and cholesterol with LDL cholesterol (bad cholesterol) goal below 70 mg/dL.  I also advised the patient to eat a healthy diet with plenty of whole grains, cereals, fruits and vegetables, exercise regularly with at least 30 minutes of continuous activity daily and maintain ideal body weight. 2. HTN: Advised to continue current treatment regimen.  Today's BP stable.  Advised to continue to monitor at home along with continued follow-up with PCP for management    Follow up in 3 months or call earlier if needed   Greater  than 50% of time during this 45 minute visit was spent on counseling, explanation of diagnosis of left thalamic hemorrhage, reviewing risk factor management of HTN, planning of further management along with potential future management, and discussion with patient and family answering all questions.    Ihor Austin, AGNP-BC  [facility] [address] [place], [place] [address]  Phone [phone] Fax [phone] Note: This document was prepared with digital dictation and possible smart phrase technology. Any transcriptional errors that result from this process are unintentional.

## 2019-12-09 NOTE — Patient Instructions (Signed)
Recommend working with home health physical, occupational and speech therapy -orders will be placed and you will be called to schedule initial evaluations  Continue to follow up with PCP regarding blood pressure management   Continue to monitor blood pressure at home  Maintain strict control of hypertension with blood pressure goal below 130/90, diabetes with hemoglobin A1c goal below 6.5% and cholesterol with LDL cholesterol (bad cholesterol) goal below 70 mg/dL. I also advised the patient to eat a healthy diet with plenty of whole grains, cereals, fruits and vegetables, exercise regularly and maintain ideal body weight.  Followup in the future with me in 3 months or call earlier if needed       Thank you for coming to see [place] at [facility]. I hope we have been able to provide you high quality care today.  You may receive a patient satisfaction survey over the next few weeks. We would appreciate your feedback and comments so that we may continue to improve ourselves and the health of our patients.

## 2019-12-09 NOTE — Telephone Encounter (Signed)
[facility] for home health [facility]ed to [facility] @ [phone]. Received a receipt of confirmation.

## 2019-12-14 ENCOUNTER — Encounter: Payer: Self-pay | Admitting: Physical Medicine and Rehabilitation

## 2019-12-14 ENCOUNTER — Encounter: Payer: Self-pay | Attending: Physical Medicine and Rehabilitation | Admitting: Physical Medicine and Rehabilitation

## 2019-12-14 ENCOUNTER — Other Ambulatory Visit: Payer: Self-pay

## 2019-12-14 VITALS — BP 121/77 | HR 63 | Ht <= 58 in | Wt 133.0 lb

## 2019-12-14 DIAGNOSIS — Z6833 Body mass index (BMI) 33.0-33.9, adult: Secondary | ICD-10-CM | POA: Insufficient documentation

## 2019-12-14 DIAGNOSIS — I610 Nontraumatic intracerebral hemorrhage in hemisphere, subcortical: Secondary | ICD-10-CM | POA: Insufficient documentation

## 2019-12-14 DIAGNOSIS — I10 Essential (primary) hypertension: Secondary | ICD-10-CM | POA: Insufficient documentation

## 2019-12-14 DIAGNOSIS — E669 Obesity, unspecified: Secondary | ICD-10-CM | POA: Insufficient documentation

## 2019-12-14 NOTE — Progress Notes (Signed)
Subjective:    Patient ID: Lindsay Howard, female    DOB: 1976-09-23, 44 y.o.   MRN: [id number]  HPI  Lindsay Howard is a 44 year old woman presenting for  follow-up following a thalamic hemorrhage for which she was admitted to CIR. This is her second follow-up appointment.   Her son and she report that she has been doing well since discharge. She has completed home therapy. She has been walking much better.    Her son says her mood has improved since starting the Mirtazepine. She has been enjoying cooking. She has been eating well at home.   She is having constipation and taking Senna two tablets at bedtime. She is not sure when her last BM was.   Pain Inventory Average Pain 2 Pain Right Now 2 My pain is numbness  In the last 24 hours, has pain interfered with the following? General activity 3 Relation with others 3 Enjoyment of life 3 What TIME of day is your pain at its worst? all Sleep (in general) Fair  Pain is worse with: walking and standing Pain improves with: rest Relief from Meds: n/a  Mobility walk without assistance walk with assistance ability to climb steps?  yes do you drive?  no needs help with transfers Do you have any goals in this area?  yes  Function not employed: date last employed . I need assistance with the following:  household duties  Neuro/Psych numbness trouble walking dizziness confusion  Prior Studies Any changes since last visit?  no  Physicians involved in your care Any changes since last visit?  no   Family History  Problem Relation Age of Onset   Epilepsy Sister    Social History   Socioeconomic History   Marital status: Married    Spouse name: Deneise Lever   Number of children: 2   Years of education: 0   Highest education level: Not on file  Occupational History   Occupation: Housewife  Tobacco Use   Smoking status: Never Smoker   Smokeless tobacco: Never Used  Substance and Sexual Activity   Alcohol use: Not  Currently   Drug use: Never   Sexual activity: Yes    Birth control/protection: Surgical  Other Topics Concern   Not on file  Social History Narrative   ** Merged History Encounter **       Originally from [place] In refugee camp in [place] for 12 years. [place] at home with husband, 2 sons, and her sister.   Social Determinants of Health   Financial Resource Strain:    Difficulty of Paying Living Expenses: Not on file  Food Insecurity:    Worried About Charity fundraiser in the Last Year: Not on file   [employer] of Food in the Last Year: Not on file  Transportation Needs:    Lack of Transportation (Medical): Not on file   Lack of Transportation (Non-Medical): Not on file  Physical Activity:    Days of Exercise per Week: Not on file   Minutes of Exercise per Session: Not on file  Stress:    Feeling of Stress : Not on file  Social Connections:    Frequency of Communication with Friends and Family: Not on file   Frequency of Social Gatherings with Friends and Family: Not on file   Attends Religious Services: Not on file   Active Member of Clubs or Organizations: Not on file   Attends Archivist Meetings:  Not on file   Marital Status: Not on file   Past Surgical History:  Procedure Laterality Date   CESAREAN SECTION  2009, 2004   CESAREAN SECTION     Past Medical History:  Diagnosis Date   Bulky or enlarged uterus    Ht 4\' 9"  (1.448 m)    Wt 133 lb (60.3 kg)    BMI 28.78 kg/m   Opioid Risk Score:   Fall Risk Score:  `1  Depression screen PHQ 2/9  Depression screen [facility] 2/9 12/14/2019 12/09/2019 11/13/2019 02/15/2016  Decreased Interest 1 0 1 0  Down, Depressed, Hopeless 0 0 1 1  PHQ - 2 Score 1 0 2 1  Altered sleeping 2 - 1 0  Tired, decreased energy 3 - 1 0  Change in appetite 0 - 2 0  Feeling bad or failure about yourself  0 - 1 0  Trouble concentrating 1 - 1 0  Moving slowly or fidgety/restless 1 - 2 0  Suicidal  thoughts 0 - 1 0  PHQ-9 Score 8 - 11 1  Difficult doing work/chores - - - Not difficult at all    Review of Systems  Constitutional: Negative.   HENT: Negative.   Eyes: Negative.   Respiratory: Negative.   Gastrointestinal: Negative.   Endocrine:       High blood sugar  Genitourinary: Negative.   Musculoskeletal: Positive for gait problem.  Skin: Negative.   Allergic/Immunologic: Negative.   Neurological: Positive for dizziness and numbness.  Psychiatric/Behavioral: Positive for confusion.  All other systems reviewed and are negative.      Objective:   Physical Exam  Constitutional: No distress . Vital signs reviewed. HEENT: EOMI, oral membranes moist Neck: supple Cardiovascular: RRR without murmur. No JVD  Respiratory: CTA Bilaterally without wheezes or rales. Normal effort  GI: BS +, non-tender, non-distended  Skin: Warm and dry. Intact. Psych: Depressed mood, flat affect Musc: No edema in extremities. No tenderness in extremities. Neurologic: Alert, follows commands Motor:   LUE/LLE appear to be 5/5 proximal distal RUE: 4/5 proximal distal- unchanged  RLE: 5/5 proximal distal Able to ambulate in room without device.       Assessment & Plan:  Lindsay Howard is a 44 year old woman presenting for follow-up following a thalamic hemorrhage for which she was admitted to CIR.  Impaired gait and mobility: -Completed home PT and OT. Has been ambulating well and cooking at home.   Depressed mood and poor appetite: -Resolved. Can discontinue Mirtazepine.   HTN: -BP is 112/74. It was also low in hospital and last visit.  -Can discontinue amlodipine.  Polypharmacy: -Can discontinue gabapentin as patient is not experiencing pain.   Constipation: -Can continue senna-docusate since she is having constipation.   20 minutes of face to face patient care time were spent during this visit. All questions were encouraged and answered. Follow up with me PRN.

## 2019-12-15 NOTE — Progress Notes (Signed)
I agree with the above plan 

## 2020-03-15 ENCOUNTER — Encounter: Payer: Self-pay | Admitting: Adult Health

## 2020-03-15 ENCOUNTER — Ambulatory Visit (INDEPENDENT_AMBULATORY_CARE_PROVIDER_SITE_OTHER): Payer: Self-pay | Admitting: Adult Health

## 2020-03-15 ENCOUNTER — Other Ambulatory Visit: Payer: Self-pay

## 2020-03-15 VITALS — BP 129/77 | HR 66 | Temp 97.8°F | Ht <= 58 in | Wt 136.0 lb

## 2020-03-15 DIAGNOSIS — R29818 Other symptoms and signs involving the nervous system: Secondary | ICD-10-CM

## 2020-03-15 DIAGNOSIS — I69398 Other sequelae of cerebral infarction: Secondary | ICD-10-CM

## 2020-03-15 DIAGNOSIS — R269 Unspecified abnormalities of gait and mobility: Secondary | ICD-10-CM

## 2020-03-15 DIAGNOSIS — Z8679 Personal history of other diseases of the circulatory system: Secondary | ICD-10-CM

## 2020-03-15 DIAGNOSIS — I610 Nontraumatic intracerebral hemorrhage in hemisphere, subcortical: Secondary | ICD-10-CM

## 2020-03-15 NOTE — Progress Notes (Signed)
[facility] [address] [place]. [place] [address] [phone]       STROKE FOLLOW UP NOTE  [name] Date of Birth:  06/22/1976 Medical Record Number:  [id number]   Reason for Referral: stroke follow up    CHIEF COMPLAINT:  Chief Complaint  Patient presents with  . Follow-up    with niece, rm 49, hx of stroke    HPI:  Lindsay Howard is a 44 year old female who is being seen today, 03/15/2020, for follow-up regarding hypertensive left thalamic hemorrhage in 10/2019.  She is accompanied by her niece who assists with interpretation.  She has been doing well since prior visit with residual right sided numbness but improvement of prior weakness and only mild dysarthria.  Blood pressure today 129/77.  Amlodipine discontinued on 12/14/2019 as antihypertensive no longer indicated. She does not routinely monitor blood pressure at home. no concerns at this time.    History: Provided for reference purposes only Stroke admission 10/15/2019: Lindsay Howard is a 44 y.o. female from [place] with history of HTN  presented to [facility] ED on 10/15/2019 after being found not verbally responsive.  Stroke work-up showed left thalamic hemorrhage as evidenced on CT head and MRI likely hypertensive in etiology.  CTA head/neck unremarkable.  Repeat CT head stable.  2D echo normal EF.  She is not on antithrombotics PTA and was not recommended to initiate due to hemorrhage.  History of HTN with high SBP 154/90 and initiated amlodipine 5 mg daily with long-term BP goal normotensive range.  LDL 123.  A1c 5.7.  Other stroke risk factors include smokeless tobacco user but no prior history of stroke.  Residual deficits include dysphagia, right facial droop, and right hemiparesis and was discharged to [facility] for ongoing therapy on 10/21/2019 and discharged home with recommendation of home health therapy on 11/03/2019.  Initial visit 12/09/2019: Lindsay Howard is a 44 year old female who is being seen today for hospital  follow-up accompanied by her niece who assists with interpreting.  Residual deficits of right hemiparesis, RUE numbness and dysarthria but overall improving.  She denies being seen by home health therapies but is interested in pursuing.  She is able to perform majority of ADLs independently but has not been able to return back to cooking or cleaning.  She does not drive.  She is able to ambulate without assistive device and denies recent falls.  She continues with in her own home with her husband and son.  Blood pressure today 111/73.  No further concerns at this time.     ROS:   14 system review of systems performed and negative with exception of numbness  PMH:  Past Medical History:  Diagnosis Date  . Bulky or enlarged uterus     PSH:  Past Surgical History:  Procedure Laterality Date  . CESAREAN SECTION  2009, 2004  . CESAREAN SECTION      Social History:  Social History   Socioeconomic History  . Marital status: Married    Spouse name: Deneise Lever  . Number of children: 2  . Years of education: 0  . Highest education level: Not on file  Occupational History  . Occupation: Housewife  Tobacco Use  . Smoking status: Never Smoker  . Smokeless tobacco: Never Used  Substance and Sexual Activity  . Alcohol use: Not Currently  . Drug use: Never  . Sexual activity: Yes    Birth control/protection: Surgical  Other Topics Concern  . Not on file  Social  History Narrative   ** Merged History Encounter **       Originally from [place] in [place] for 12 years. [place] at home with husband, 2 sons, and her sister.   Social Determinants of Health   Financial Resource Strain:   . Difficulty of Paying Living Expenses:   Food Insecurity:   . Worried About Programme researcher, broadcasting/film/video in the Last Year:   . Barista in the Last Year:   Transportation Needs:   . Freight forwarder (Medical):   Marland Kitchen Lack of Transportation (Non-Medical):   Physical  Activity:   . Days of Exercise per Week:   . Minutes of Exercise per Session:   Stress:   . Feeling of Stress :   Social Connections:   . Frequency of Communication with Friends and Family:   . Frequency of Social Gatherings with Friends and Family:   . Attends Religious Services:   . Active Member of Clubs or Organizations:   . Attends Banker Meetings:   Marland Kitchen Marital Status:   Intimate Partner Violence:   . Fear of Current or Ex-Partner:   . Emotionally Abused:   Marland Kitchen Physically Abused:   . Sexually Abused:     Family History:  Family History  Problem Relation Age of Onset  . Epilepsy Sister     Medications:   No current outpatient medications on file prior to visit.   No current facility-administered medications on file prior to visit.    Allergies:  No Known Allergies   Physical Exam  Vitals:   03/15/20 1004  BP: 129/77  Pulse: 66  Temp: 97.8 F (36.6 C)  Weight: 136 lb (61.7 kg)  Height: 4\' 8"  (1.422 m)   Body mass index is 30.49 kg/m. No exam data present  General: well developed, well nourished, pleasant middle-age Asian female, seated, in no evident distress Head: head normocephalic and atraumatic.   Neck: supple with no carotid or supraclavicular bruits Cardiovascular: regular rate and rhythm, no murmurs Musculoskeletal: no deformity Skin:  no rash/petichiae Vascular:  Normal pulses all extremities   Neurologic Exam Mental Status: Awake and fully alert.  Unable to assess language or speech due to language barrier but niece does endorse mild slurring but overall improving. Oriented to place and time per niece. Recent and remote memory intact and attention span, concentration and fund of knowledge appropriate per neice. Mood and affect appropriate.  Cranial Nerves: Pupils equal, briskly reactive to light. Extraocular movements full without nystagmus. Visual fields full to confrontation. Hearing intact. Facial sensation intact.  Mild right lower  facial weakness Motor: Normal bulk and tone. Full strength in upper and lower extremities  Sensory.: intact to touch , pinprick , position and vibratory sensation.  Coordination: Rapid alternating movements normal in all extremities except mildly decreased right hand. Finger-to-nose and heel-to-shin performed accurately bilaterally.  Gait and Station: Arises from chair without difficulty. Stance is normal. Gait demonstrates  mild left-sided hemiplegic gait without assistive device Reflexes: 1+ and symmetric. Toes downgoing.        ASSESSMENT: Lindsay Howard is a 44 y.o. year old female presented after being found not verbally responsive on 10/15/2019 with stroke work-up revealing left thalamic hemorrhage secondary to hypertensive etiology. Vascular risk factors include HTN and smokeless tobacco user.  Has been stable from a stroke standpoint with residual gait impairment and left hemisensory    PLAN:  1. Left thalamic hemorrhage:  -No  indication to initiate antithrombotic as no prior history of stroke or CAD.  Advised to avoid aspirin, aspirin-containing products or ibuprofen products due to recent hemorrhage.   -Maintain strict control of hypertension with blood pressure goal below 130/90, diabetes with hemoglobin A1c goal below 6.5% and cholesterol with LDL cholesterol (bad cholesterol) goal below 70 mg/dL.  I also advised the patient to eat a healthy diet with plenty of whole grains, cereals, fruits and vegetables, exercise regularly with at least 30 minutes of continuous activity daily and maintain ideal body weight. 2. HTN: Stable with recent discontinuation of antihypertensives.  Highly encouraged monitoring blood pressure at home to ensure ongoing satisfactory management and secondary stroke prevention.  Continue to follow with PCP for monitoring   Overall stable from stroke standpoint recommend follow-up as needed regarding prior stroke  I spent 22 minutes of face-to-face and  non-face-to-face time with patient and niece.  This included previsit chart review, lab review, study review, order entry, electronic health record documentation, patient education regarding prior hypertensive left thalamic hemorrhage and importance of managing blood pressure and continue monitor at home and answered all questions to patient satisfaction with assistance of niece for language interpretation -waiver of right to free interpreter service signed     Ihor Austin, [facility]  [facility] [address] [place], [place] [address]  Phone [phone] Fax [phone] Note: This document was prepared with digital dictation and possible smart phrase technology. Any transcriptional errors that result from this process are unintentional.

## 2020-03-15 NOTE — Patient Instructions (Signed)
Recommend starting blood pressure at home for stroke prevention  Continue to follow with PCP for chronic disease management  Maintain strict control of hypertension with blood pressure goal below 130/90, diabetes with hemoglobin A1c goal below 6.5% and cholesterol with LDL cholesterol (bad cholesterol) goal below 70 mg/dL. I also advised the patient to eat a healthy diet with plenty of whole grains, cereals, fruits and vegetables, exercise regularly and maintain ideal body weight.  Follow up as needed       Thank you for coming to see [place] at [facility]. I hope we have been able to provide you high quality care today.  You may receive a patient satisfaction survey over the next few weeks. We would appreciate your feedback and comments so that we may continue to improve ourselves and the health of our patients.

## 2020-03-15 NOTE — Progress Notes (Signed)
I agree with the above plan 

## 2020-07-02 ENCOUNTER — Ambulatory Visit: Payer: Medicaid Other | Attending: Critical Care Medicine

## 2020-07-02 DIAGNOSIS — Z23 Encounter for immunization: Secondary | ICD-10-CM

## 2020-07-02 NOTE — Progress Notes (Signed)
   Covid-19 Vaccination Clinic  Name:  Keia Rask    MRN: [id number] DOB: [birth year]  07/02/2020  Ms. Genao was observed post Covid-19 immunization for 15 minutes without incident. She was provided with Vaccine Information Sheet and instruction to access the V-Safe system.   Ms. Forni was instructed to call 911 with any severe reactions post vaccine: Marland Kitchen Difficulty breathing  . Swelling of face and throat  . A fast heartbeat  . A bad rash all over body  . Dizziness and weakness   Immunizations Administered    Name Date Dose VIS Date Route   Pfizer COVID-19 Vaccine 07/02/2020 10:15 AM 0.3 mL 02/03/2019 Intramuscular   Manufacturer: [employer], [employer]   Lot: [id number]   NDC: [address]-8

## 2020-07-23 ENCOUNTER — Ambulatory Visit: Payer: Medicaid Other | Attending: Internal Medicine

## 2020-07-23 DIAGNOSIS — Z23 Encounter for immunization: Secondary | ICD-10-CM

## 2020-07-23 NOTE — Progress Notes (Signed)
   Covid-19 Vaccination Clinic  Name:  Lindsay Howard    MRN: [id number] DOB: 05/02/1976  07/23/2020  Ms. [name] was observed post Covid-19 immunization for 15 minutes without incident. She was provided with Vaccine Information Sheet and instruction to access the V-Safe system.   [name] was instructed to call 911 with any severe reactions post vaccine: Marland Kitchen Difficulty breathing  . Swelling of face and throat  . A fast heartbeat  . A bad rash all over body  . Dizziness and weakness   Immunizations Administered    Name Date Dose VIS Date Route   Pfizer COVID-19 Vaccine 07/23/2020 10:40 AM 0.3 mL 02/03/2019 Intramuscular   Manufacturer: [employer], [employer]   Lot: [id number]   NDC: [address]-3

## 2021-04-08 ENCOUNTER — Ambulatory Visit: Payer: Medicaid Other | Admitting: Family Medicine

## 2021-04-13 ENCOUNTER — Telehealth: Payer: Self-pay | Admitting: Family Medicine

## 2021-04-13 NOTE — Telephone Encounter (Signed)
Patient referred for N 648 but no interpreter available on Saturday. Left generic voicemail for son to call back--if calls back, ask if patient can come June 1 at 2 PM for N 648 examination (citizenship form).  Terisa Starr, MD  Family Medicine Teaching Service

## 2021-10-28 IMAGING — MR MR HEAD W/O CM
12 of 13 series · 43 of 48 positions shown · non-contrast
Comparison: CTA head neck from yesterday
COMPARISON: CTA head neck from yesterday

Addendum:
CLINICAL DATA: Intracranial hemorrhage

EXAM:
MRI HEAD WITHOUT CONTRAST
TECHNIQUE: Multiplanar, multiecho pulse sequences of the brain and surrounding
structures were obtained without intravenous contrast.

[Series 5: DWI · axial · 3.0mm · 0.88mm/px · z∈[-157,-39]mm · 6 of 92 slices shown (1 of 4)]
[im 1/92]
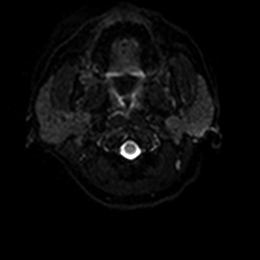
[im 19/92]
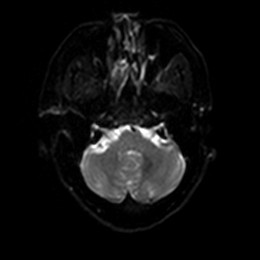
[im 37/92]
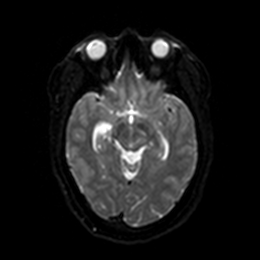
[im 55/92]
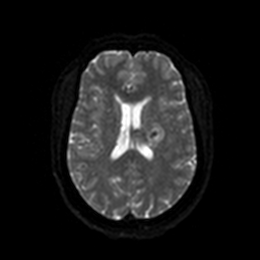
[im 73/92]
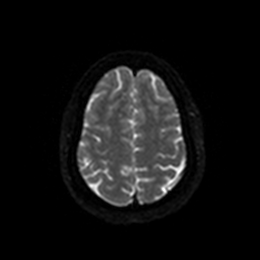
[im 92/92]
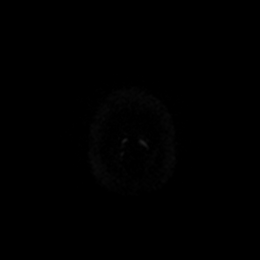

[Series 6: DWI · axial · 3.0mm · 0.88mm/px · z∈[-157,-39]mm · 4 of 46 slices shown (2 of 4)]
[im 1/46]
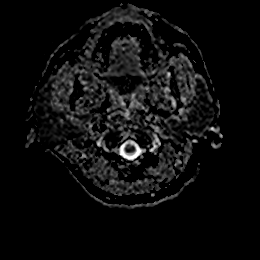
[im 16/46]
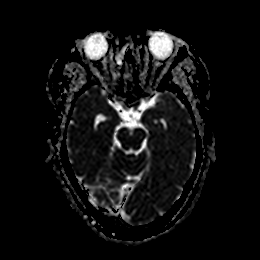
[im 31/46]
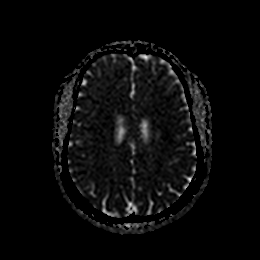
[im 46/46]
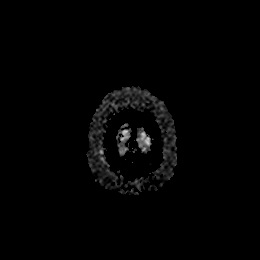

[Series 7: DWI · coronal · 4.0mm · 0.88mm/px · 5 of 64 slices shown (3 of 4)]
[im 1/64]
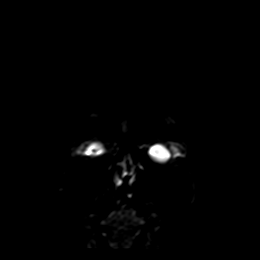
[im 16/64]
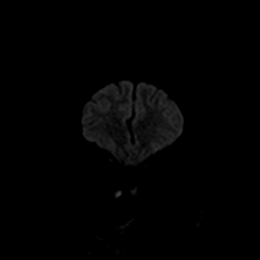
[im 32/64]
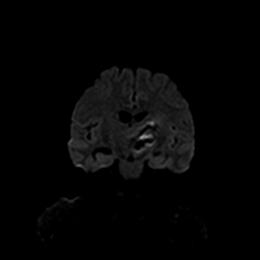
[im 48/64]
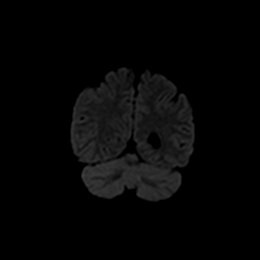
[im 64/64]
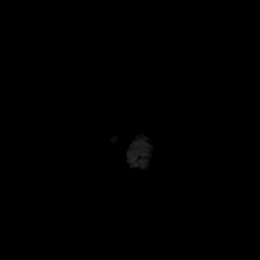

[Series 8: DWI · coronal · 4.0mm · 0.88mm/px · 2 of 32 slices shown (4 of 4)]
[im 1/32]
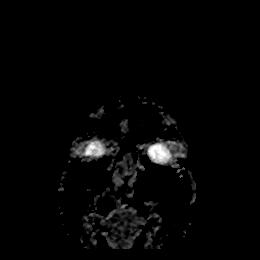
[im 32/32]
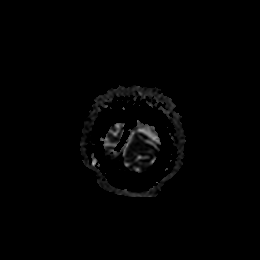

[Series 9: T1 · sagittal · 5.0mm · 0.75mm/px · 2 of 23 slices shown]
[im 1/23]
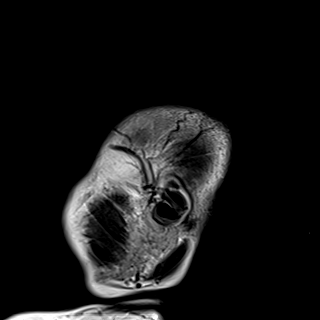
[im 23/23]
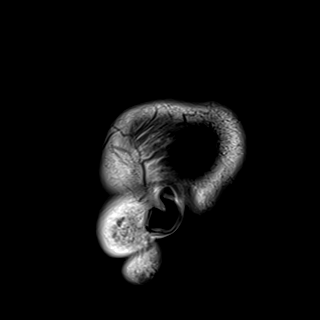

[Series 10: T2 · axial · 5.0mm · 0.72mm/px · z∈[-165,-39]mm · 2 of 25 slices shown (1 of 2)]
[im 1/25]
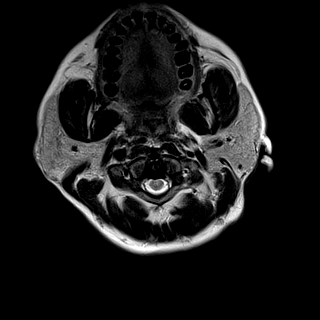
[im 25/25]
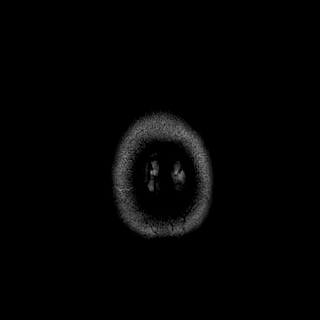

[Series 11: FLAIR · axial · 5.0mm · 0.45mm/px · z∈[-162,-36]mm · 2 of 25 slices shown]
[im 1/25]
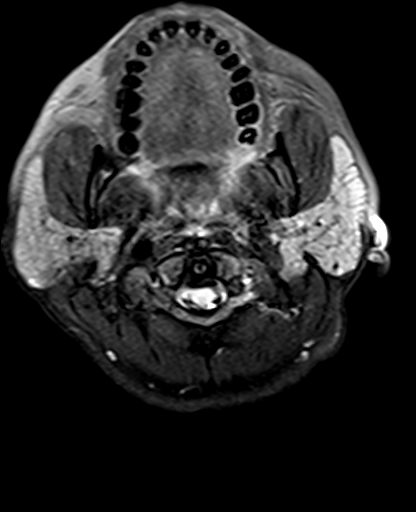
[im 25/25]
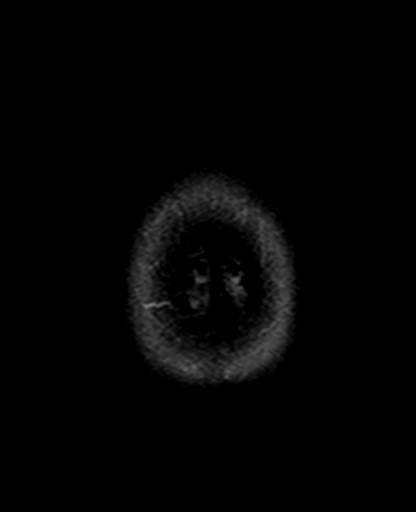

[Series 12: mag_images · axial · 3.0mm · 0.90mm/px · z∈[-167,-12]mm · 5 of 60 slices shown]
[im 1/60]
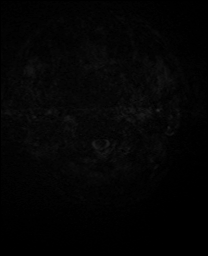
[im 15/60]
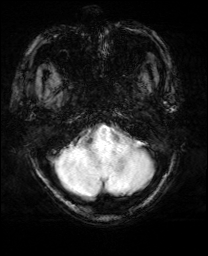
[im 30/60]
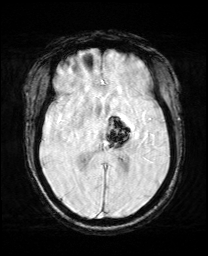
[im 45/60]
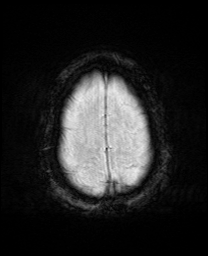
[im 60/60]
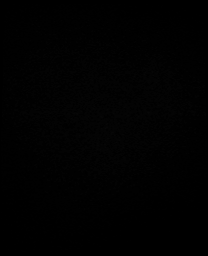

[Series 13: pha_images · axial · 3.0mm · 0.90mm/px · z∈[-165,-28]mm · 4 of 52 slices shown]
[im 1/52]
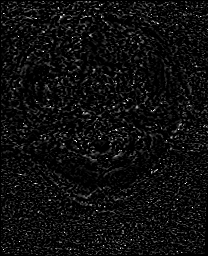
[im 18/52]
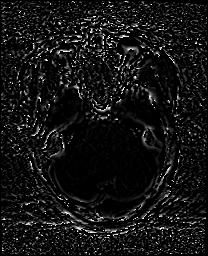
[im 35/52]
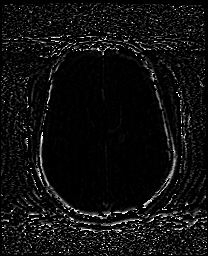
[im 52/52]
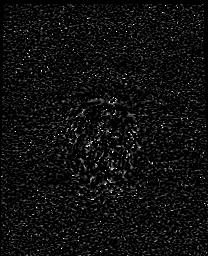

[Series 14: swi_images · axial · 3.0mm · 0.90mm/px · z∈[-167,-12]mm · 5 of 60 slices shown]
[im 1/60]
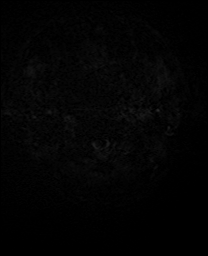
[im 15/60]
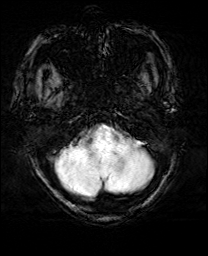
[im 30/60]
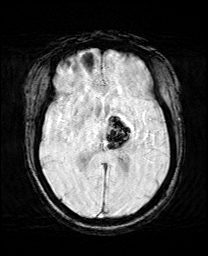
[im 45/60]
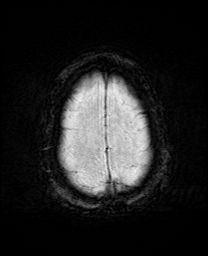
[im 60/60]
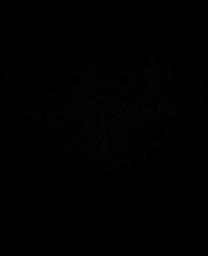

[Series 15: mip_images(sw) · axial · 24.0mm · 0.90mm/px · z∈[-158,-21]mm · 4 of 53 slices shown]
[im 1/53]
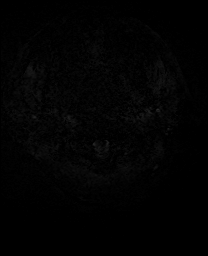
[im 18/53]
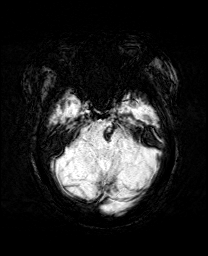
[im 35/53]
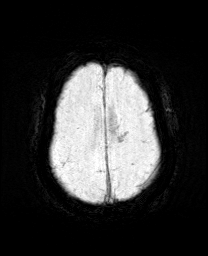
[im 53/53]
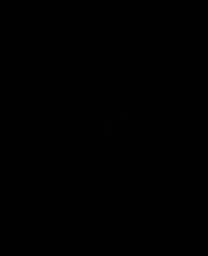

[Series 17: T2 · coronal · 5.0mm · 0.72mm/px · 2 of 26 slices shown (2 of 2)]
[im 1/26]
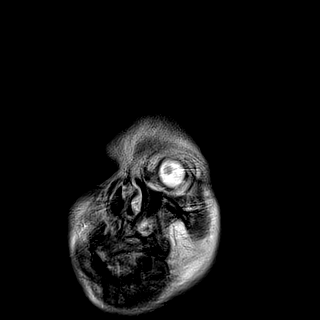
[im 26/26]
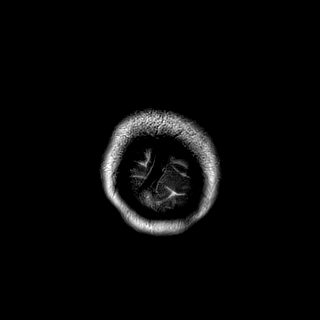

[43 of 48 positions shown; findings below may reference images not displayed]

FINDINGS: Brain: When measured on T1 and T2 weighted imaging there is a size
stable left thalamic hematoma extending towards the upper midbrain
which measures 14 x 22 mm on axial slices. Signal characteristics
match acute timing. There is a rim of edema which may have
increased. This is a noncontrast study; no evidence of underlying
mass. No vascular malformation seen on prior study. No evidence of
intraventricular extension or hydrocephalus. Diffusion signal is
attributed to susceptibility artifact-no definite underlying
infarct. Remote microhemorrhage in the right cerebral white matter.

Vascular: Normal flow voids

Skull and upper cervical spine: Diffusely low marrow signal

Sinuses/Orbits: Negative
IMPRESSION: 1. Left thalamic hematoma with size stability since earlier CTA. The
rim of edema has progressed.
2. No evidence of underlying mass or primary infarct.
3. Minimal white matter disease.

ADDENDUM:
Omitted impression #4 of generally low marrow signal, please
correlate with CBC.

*** End of Addendum ***
FINDINGS: Brain: When measured on T1 and T2 weighted imaging there is a size
stable left thalamic hematoma extending towards the upper midbrain
which measures 14 x 22 mm on axial slices. Signal characteristics
match acute timing. There is a rim of edema which may have
increased. This is a noncontrast study; no evidence of underlying
mass. No vascular malformation seen on prior study. No evidence of
intraventricular extension or hydrocephalus. Diffusion signal is
attributed to susceptibility artifact-no definite underlying
infarct. Remote microhemorrhage in the right cerebral white matter.

Vascular: Normal flow voids

Skull and upper cervical spine: Diffusely low marrow signal

Sinuses/Orbits: Negative
IMPRESSION: 1. Left thalamic hematoma with size stability since earlier CTA. The
rim of edema has progressed.
2. No evidence of underlying mass or primary infarct.
3. Minimal white matter disease.

## 2022-09-26 ENCOUNTER — Ambulatory Visit: Payer: Medicaid Other

## 2022-09-26 DIAGNOSIS — Z23 Encounter for immunization: Secondary | ICD-10-CM
